# Patient Record
Sex: Female | Born: 1995
Health system: Southern US, Community
[De-identification: ages and names within clinical notes are randomized; demographics above are authoritative.]

## PROBLEM LIST (undated history)

## (undated) ENCOUNTER — Inpatient Hospital Stay (HOSPITAL_COMMUNITY): Payer: Self-pay

## (undated) DIAGNOSIS — J45909 Unspecified asthma, uncomplicated: Secondary | ICD-10-CM

## (undated) DIAGNOSIS — N946 Dysmenorrhea, unspecified: Secondary | ICD-10-CM

## (undated) DIAGNOSIS — F419 Anxiety disorder, unspecified: Secondary | ICD-10-CM

## (undated) DIAGNOSIS — K5190 Ulcerative colitis, unspecified, without complications: Secondary | ICD-10-CM

## (undated) DIAGNOSIS — N809 Endometriosis, unspecified: Secondary | ICD-10-CM

## (undated) DIAGNOSIS — R011 Cardiac murmur, unspecified: Secondary | ICD-10-CM

## (undated) DIAGNOSIS — O139 Gestational [pregnancy-induced] hypertension without significant proteinuria, unspecified trimester: Secondary | ICD-10-CM

## (undated) HISTORY — DX: Gestational (pregnancy-induced) hypertension without significant proteinuria, unspecified trimester: O13.9

## (undated) HISTORY — PX: BIOPSY BOWEL: PRO7

## (undated) HISTORY — DX: Anxiety disorder, unspecified: F41.9

## (undated) HISTORY — DX: Dysmenorrhea, unspecified: N94.6

## (undated) HISTORY — PX: COLON BIOPSY: SHX1369

## (undated) HISTORY — DX: Unspecified asthma, uncomplicated: J45.909

## (undated) HISTORY — PX: NO PAST SURGERIES: SHX2092

---
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
## 2016-01-15 ENCOUNTER — Emergency Department (HOSPITAL_COMMUNITY)
Admission: EM | Admit: 2016-01-15 | Discharge: 2016-01-15 | Disposition: A | Discharge status: Home / Self Care | Payer: Self-pay | Attending: Emergency Medicine | Admitting: Emergency Medicine

## 2016-01-15 DIAGNOSIS — Z8719 Personal history of other diseases of the digestive system: Secondary | ICD-10-CM | POA: Insufficient documentation

## 2016-01-15 DIAGNOSIS — K529 Noninfective gastroenteritis and colitis, unspecified: Secondary | ICD-10-CM | POA: Insufficient documentation

## 2016-01-15 DIAGNOSIS — Z79899 Other long term (current) drug therapy: Secondary | ICD-10-CM | POA: Insufficient documentation

## 2016-01-15 LAB — CBC
HCT: 37.4 % (ref 36.0–46.0)
HEMOGLOBIN: 12.7 g/dL (ref 12.0–15.0)
MCH: 30.2 pg (ref 26.0–34.0)
MCHC: 34 g/dL (ref 30.0–36.0)
MCV: 88.8 fL (ref 78.0–100.0)
Platelets: 405 10*3/uL — ABNORMAL HIGH (ref 150–400)
RBC: 4.21 MIL/uL (ref 3.87–5.11)
RDW: 13.3 % (ref 11.5–15.5)
WBC: 15.8 10*3/uL — ABNORMAL HIGH (ref 4.0–10.5)

## 2016-01-15 LAB — COMPREHENSIVE METABOLIC PANEL
ALK PHOS: 49 U/L (ref 38–126)
ALT: 13 U/L — ABNORMAL LOW (ref 14–54)
ANION GAP: 9 (ref 5–15)
AST: 20 U/L (ref 15–41)
Albumin: 3.9 g/dL (ref 3.5–5.0)
BILIRUBIN TOTAL: 0.9 mg/dL (ref 0.3–1.2)
BUN: 10 mg/dL (ref 6–20)
CALCIUM: 8.6 mg/dL — AB (ref 8.9–10.3)
CO2: 22 mmol/L (ref 22–32)
Chloride: 105 mmol/L (ref 101–111)
Creatinine, Ser: 0.44 mg/dL (ref 0.44–1.00)
Glucose, Bld: 123 mg/dL — ABNORMAL HIGH (ref 65–99)
Potassium: 3.8 mmol/L (ref 3.5–5.1)
Sodium: 136 mmol/L (ref 135–145)
TOTAL PROTEIN: 8.1 g/dL (ref 6.5–8.1)

## 2016-01-15 LAB — I-STAT BETA HCG BLOOD, ED (MC, WL, AP ONLY): I-stat hCG, quantitative: <5 m[IU]/mL (ref <5)

## 2016-01-15 LAB — LIPASE, BLOOD: Lipase: 16 U/L (ref 11–51)

## 2016-01-15 MED ORDER — ONDANSETRON HCL 4 MG/2ML IJ SOLN
4.0000 mg | Freq: Once | INTRAMUSCULAR | Status: AC | PRN
  Administered 2016-01-15: 4 mg via INTRAVENOUS
  Filled 2016-01-15: qty 2

## 2016-01-15 MED ORDER — ONDANSETRON HCL 4 MG/2ML IJ SOLN
4.0000 mg | Freq: Once | INTRAMUSCULAR | Status: AC
  Administered 2016-01-15: 4 mg via INTRAVENOUS
  Filled 2016-01-15: qty 2

## 2016-01-15 MED ORDER — PREDNISONE 10 MG PO TABS: 20.0000 mg | ORAL_TABLET | Freq: Two times a day (BID) | ORAL | 0 refills | Status: DC

## 2016-01-15 MED ORDER — SODIUM CHLORIDE 0.9 % IV BOLUS (SEPSIS)
1000.0000 mL | Freq: Once | INTRAVENOUS | Status: AC
  Administered 2016-01-15: 1000 mL via INTRAVENOUS

## 2016-01-15 MED ORDER — ONDANSETRON 8 MG PO TBDP: ORAL_TABLET | ORAL | 0 refills | Status: DC

## 2016-01-15 MED ORDER — KETOROLAC TROMETHAMINE 30 MG/ML IJ SOLN
30.0000 mg | Freq: Once | INTRAMUSCULAR | Status: AC
  Administered 2016-01-15: 30 mg via INTRAVENOUS
  Filled 2016-01-15: qty 1

## 2016-01-15 NOTE — Discharge Instructions (Signed)
Prednisone as prescribed.  Zofran as prescribed as needed for nausea.  Follow-up with your gastroenterologist in the next 1-2 weeks for a recheck, and return to the ER if you develop worsening pain, high fever, bloody stool, or other new and concerning symptoms.

## 2016-01-15 NOTE — ED Provider Notes (Signed)
[facility] DEPT Provider Note   CSN: [id number] Arrival date & time: 01/15/16  0343     History   Chief Complaint Chief Complaint  Patient presents with  . Abdominal Pain    HPI Kathleen Hamilton is a 20 y.o. female.  Patient is a 20 year old female with history of ulcerative colitis. She presents for evaluation of nausea, vomiting, and diarrhea. This started acutely this evening. She reports watery diarrhea that is nonbloody. She denies any fevers or chills, but did feel flushed earlier. She denies any ill contacts. She denies consuming any suspicious foods.   The history is provided by the patient.  Abdominal Pain   This is a new problem. The current episode started 3 to 5 hours ago. The problem occurs constantly. The problem has been gradually improving. The pain is located in the generalized abdominal region. The quality of the pain is cramping. The pain is moderate. Associated symptoms include nausea and vomiting. Pertinent negatives include fever and flatus. Nothing aggravates the symptoms. Nothing relieves the symptoms.    Past Medical History:  Diagnosis Date  . Dysmenorrhea   . Endometriosis   . Heart murmur   . Ulcerative colitis [facility])     Patient Active Problem List   Diagnosis Date Noted  . VISUAL ACUITY, DECREASED 04/19/2010  . OVERWEIGHT 04/15/2010  . DYSMENORRHEA 04/15/2010  . MENORRHAGIA 04/15/2010    Past Surgical History:  Procedure Laterality Date  . COLON BIOPSY     hx of colon problems.  ? mega colon, awaiting bx results  . NO PAST SURGERIES      OB History    Gravida Para Term Preterm AB Living   1             SAB TAB Ectopic Multiple Live Births                   Home Medications    Prior to Admission medications   Medication Sig Start Date End Date Taking? Authorizing Provider  acetaminophen (TYLENOL) 500 MG tablet Take 1 tablet (500 mg total) by mouth every 6 (six) hours as needed. Patient not taking: Reported on 11/28/2014  10/07/14   Antonietta Breach, PA-C  albuterol (PROVENTIL HFA;VENTOLIN HFA) 108 (90 BASE) MCG/ACT inhaler Inhale 2 puffs into the lungs 4 (four) times daily. Patient taking differently: Inhale 2 puffs into the lungs every 4 (four) hours as needed for wheezing or shortness of breath.  10/29/13   Harden Mo, MD  diphenoxylate-atropine (LOMOTIL) 2.5-0.025 MG tablet Take 1 tablet by mouth 4 (four) times daily as needed for diarrhea or loose stools. 10/03/15   Konrad Felix, PA  HYDROcodone-acetaminophen (NORCO/VICODIN) 5-325 MG tablet Take 2 tablets by mouth every 4 (four) hours as needed. 10/03/15   Konrad Felix, PA  ipratropium (ATROVENT) 0.06 % nasal spray Place 2 sprays into both nostrils 4 (four) times daily. Patient not taking: Reported on 11/28/2014 10/29/13   Harden Mo, MD  Norethindrone Acetate-Ethinyl Estrad-FE (LOESTRIN 24 FE) 1-20 MG-MCG(24) tablet Take 1 tablet by mouth daily. 11/15/14   Historical Provider, MD  omeprazole (PRILOSEC) 20 MG capsule Take 20 mg by mouth daily. 03/20/14   Historical Provider, MD  ondansetron (ZOFRAN ODT) 4 MG disintegrating tablet 34m ODT q4 hours prn nausea/vomit 11/28/14   DJulianne Rice MD  oxyCODONE-acetaminophen (PERCOCET/ROXICET) 5-325 MG per tablet Take 1 tablet by mouth every 6 (six) hours as needed for severe pain. Patient not taking: Reported on 11/28/2014 07/31/14   JAlmyra Free  N Wenzel, PA-C  predniSONE (DELTASONE) 10 MG tablet Sig: 4 tables once a day for 3 days, 3 tablets once a day X3 days, 2 tablets a day for 3 days, 1 tablet a day for 3 days. 10/03/15   Konrad Felix, PA  promethazine (PHENERGAN) 12.5 MG tablet Take 1 tablet (12.5 mg total) by mouth every 6 (six) hours as needed for nausea or vomiting. Patient not taking: Reported on 11/28/2014 07/31/14   Luvenia Redden, PA-C  sulfaSALAzine (AZULFIDINE) 500 MG tablet Take 2 tablets by mouth 2 (two) times daily. 09/28/14   Historical Provider, MD    Family History Family History  Problem Relation  Age of Onset  . Diabetes Maternal Grandmother   . Hypertension Maternal Grandmother   . Heart disease Maternal Grandmother   . Diabetes Paternal Grandmother   . Heart disease Paternal Grandmother   . Hypertension Paternal Grandmother   . Diabetes Paternal Grandfather   . Heart disease Paternal Grandfather   . Hypertension Paternal Grandfather     Social History Social History  Substance Use Topics  . Smoking status: Never Smoker  . Smokeless tobacco: Never Used  . Alcohol use No     Allergies   Aspirin; Fish allergy; Naproxen; and Shellfish allergy   Review of Systems Review of Systems  Constitutional: Negative for fever.  Gastrointestinal: Positive for abdominal pain, nausea and vomiting. Negative for flatus.  All other systems reviewed and are negative.    Physical Exam Updated Vital Signs BP 134/80 (BP Location: Left Arm)   Pulse 111   Temp 98.4 F (36.9 C) (Oral)   Resp 20   Ht 5' 5.5" (1.664 m)   Wt 225 lb (102.1 kg)   SpO2 97%   BMI 36.87 kg/m   Physical Exam  Constitutional: She is oriented to person, place, and time. She appears well-developed and well-nourished. No distress.  HENT:  Head: Normocephalic and atraumatic.  Neck: Normal range of motion. Neck supple.  Cardiovascular: Normal rate and regular rhythm.  Exam reveals no gallop and no friction rub.   No murmur heard. Pulmonary/Chest: Effort normal and breath sounds normal. No respiratory distress. She has no wheezes.  Abdominal: Soft. Bowel sounds are normal. She exhibits no distension. There is tenderness. There is no rebound and no guarding.  There is mild tenderness to palpation in all 4 quadrants with no rebound or guarding.  Musculoskeletal: Normal range of motion.  Neurological: She is alert and oriented to person, place, and time.  Skin: Skin is warm and dry. She is not diaphoretic.  Nursing note and vitals reviewed.    ED Treatments / Results  Labs (all labs ordered are listed,  but only abnormal results are displayed) Labs Reviewed  CBC - Abnormal; Notable for the following:       Result Value   WBC 15.8 (*)    Platelets 405 (*)    All other components within normal limits  LIPASE, BLOOD  COMPREHENSIVE METABOLIC PANEL  URINALYSIS, ROUTINE W REFLEX MICROSCOPIC (NOT AT [facility])  I-STAT BETA HCG BLOOD, ED ([facility], [facility], [facility] ONLY)    EKG  EKG Interpretation None       Radiology No results found.  Procedures Procedures (including critical care time)  Medications Ordered in ED Medications  sodium chloride 0.9 % bolus 1,000 mL (not administered)  ketorolac (TORADOL) 30 MG/ML injection 30 mg (not administered)  ondansetron (ZOFRAN) injection 4 mg (not administered)  ondansetron (ZOFRAN) injection 4 mg (4 mg Intravenous Given  01/15/16 0440)     Initial Impression / Assessment and Plan / ED Course  I have reviewed the triage vital signs and the nursing notes.  Pertinent labs & imaging results that were available during my care of the patient were reviewed by me and considered in my medical decision making (see chart for details).  Clinical Course     Symptoms either viral or possibly related to a flare of ulcerative colitis. She will be treated with prednisone and Zofran. To return as needed for any problems.  Her abdomen was reexamined prior to discharge. She has no focal tenderness, most notably no tenderness in the right lower quadrant. She does have an elevated Chevalier count, however I believe this is attributed to either an infectious diarrhea or flare of her UC. I highly doubt appendicitis, cholecystitis, or other acute intra-abdominal process.  Final Clinical Impressions(s) / ED Diagnoses   Final diagnoses:  None    New Prescriptions New Prescriptions   No medications on file     Veryl Speak, MD 01/15/16 [phone]

## 2016-01-15 NOTE — ED Notes (Addendum)
Pt has been having abdominal pain, watery diarrhea, nausea, vomiting since 2000 yesterday. Pt unable to sleep due to pain. Pt unable to hold down any fluids. Hx of ulcerative colitis. Pt reports no blood in stool.

## 2016-07-04 ENCOUNTER — Ambulatory Visit (HOSPITAL_COMMUNITY)
Admission: EM | Admit: 2016-07-04 | Discharge: 2016-07-04 | Disposition: A | Discharge status: Home / Self Care | Payer: BLUE CROSS/BLUE SHIELD | Attending: Family Medicine | Admitting: Family Medicine

## 2016-07-04 ENCOUNTER — Encounter (HOSPITAL_COMMUNITY): Payer: Self-pay | Admitting: Emergency Medicine

## 2016-07-04 DIAGNOSIS — B349 Viral infection, unspecified: Secondary | ICD-10-CM

## 2016-07-04 MED ORDER — BUTALBITAL-APAP-CAFFEINE 50-325-40 MG PO TABS: 1.0000 | ORAL_TABLET | Freq: Four times a day (QID) | ORAL | 0 refills | Status: AC | PRN

## 2016-07-04 NOTE — ED Provider Notes (Signed)
[facility]    CSN: [id number] Arrival date & time: 07/04/16  1811     History   Chief Complaint Chief Complaint  Patient presents with  . Headache    HPI Kathleen Hamilton is a 21 y.o. female.   Headache since yesterday.  Has had dizziness and nausea.  She's had some viral malaise over the last couple days followed by the headache that began last night. The headache is described as being like a band around her head.  Patient has no neurological signs or fever.  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      Past Medical History:  Diagnosis Date  . Dysmenorrhea   . Endometriosis   . Heart murmur   . Ulcerative colitis [facility])     Patient Active Problem List   Diagnosis Date Noted  . VISUAL ACUITY, DECREASED 04/19/2010  . OVERWEIGHT 04/15/2010  . DYSMENORRHEA 04/15/2010  . MENORRHAGIA 04/15/2010    Past Surgical History:  Procedure Laterality Date  . COLON BIOPSY     hx of colon problems.  ? mega colon, awaiting bx results  . NO PAST SURGERIES      OB History    Gravida Para Term Preterm AB Living   1             SAB TAB Ectopic Multiple Live Births                   Home Medications    Prior to Admission medications   Medication Sig Start Date End Date Taking? Authorizing Provider  acetaminophen (TYLENOL) 500 MG tablet Take 1 tablet (500 mg total) by mouth every 6 (six) hours as needed. Patient not taking: Reported on 11/28/2014 10/07/14   Antonietta Breach, PA-C  albuterol (PROVENTIL HFA;VENTOLIN HFA) 108 (90 BASE) MCG/ACT inhaler Inhale 2 puffs into the lungs 4 (four) times daily. Patient taking differently: Inhale 2 puffs into the lungs every 4 (four) hours as needed for wheezing or shortness of breath.  10/29/13   Harden Mo, MD  butalbital-acetaminophen-caffeine (FIORICET, ESGIC) [phone] MG tablet Take 1-2 tablets by mouth every 6 (six) hours as needed for headache. 07/04/16 07/04/17  Robyn Haber, MD    diphenoxylate-atropine (LOMOTIL) 2.5-0.025 MG tablet Take 1 tablet by mouth 4 (four) times daily as needed for diarrhea or loose stools. Patient not taking: Reported on 01/15/2016 10/03/15   Konrad Felix, PA  HYDROcodone-acetaminophen (NORCO/VICODIN) 5-325 MG tablet Take 2 tablets by mouth every 4 (four) hours as needed. Patient not taking: Reported on 01/15/2016 10/03/15   Linde Gillis C, PA  ipratropium (ATROVENT) 0.06 % nasal spray Place 2 sprays into both nostrils 4 (four) times daily. Patient not taking: Reported on 11/28/2014 10/29/13   Harden Mo, MD  ondansetron (ZOFRAN ODT) 8 MG disintegrating tablet 38m ODT q4 hours prn nausea 01/15/16   DVeryl Speak MD  oxyCODONE-acetaminophen (PERCOCET/ROXICET) 5-325 MG per tablet Take 1 tablet by mouth every 6 (six) hours as needed for severe pain. Patient not taking: Reported on 11/28/2014 07/31/14   WLuvenia Redden PA-C  predniSONE (DELTASONE) 10 MG tablet Take 2 tablets (20 mg total) by mouth 2 (two) times daily. 01/15/16   DVeryl Speak MD  promethazine (PHENERGAN) 12.5 MG tablet Take 1 tablet (12.5 mg total) by mouth every 6 (six) hours as needed for nausea or vomiting. Patient not taking: Reported on 11/28/2014 07/31/14   WLuvenia Redden PA-C    Family  History Family History  Problem Relation Age of Onset  . Diabetes Maternal Grandmother   . Hypertension Maternal Grandmother   . Heart disease Maternal Grandmother   . Diabetes Paternal Grandmother   . Heart disease Paternal Grandmother   . Hypertension Paternal Grandmother   . Diabetes Paternal Grandfather   . Heart disease Paternal Grandfather   . Hypertension Paternal Grandfather     Social History Social History  Substance Use Topics  . Smoking status: Never Smoker  . Smokeless tobacco: Never Used  . Alcohol use No     Allergies   Aspirin; Fish allergy; Naproxen; and Shellfish allergy   Review of Systems Review of Systems  Constitutional: Negative.   HENT:  Negative.   Respiratory: Negative.   Musculoskeletal: Positive for myalgias.  Neurological: Positive for headaches. Negative for tremors, seizures, syncope, facial asymmetry, speech difficulty, weakness and numbness.     Physical Exam Triage Vital Signs ED Triage Vitals [07/04/16 1852]  Enc Vitals Group     BP 121/75     Pulse Rate 71     Resp 16     Temp 98.7 F (37.1 C)     Temp Source Oral     SpO2 100 %     Weight      Height      Head Circumference      Peak Flow      Pain Score 8     Pain Loc      Pain Edu?      Excl. in [place]?    No data found.   Updated Vital Signs BP 121/75 (BP Location: Right Arm)   Pulse 71   Temp 98.7 F (37.1 C) (Oral)   Resp 16   LMP 06/27/2016   SpO2 100%    Physical Exam  Constitutional: She is oriented to person, place, and time. She appears well-developed and well-nourished.  HENT:  Right Ear: External ear normal.  Left Ear: External ear normal.  Mouth/Throat: Oropharynx is clear and moist.  Eyes: Conjunctivae and EOM are normal. Pupils are equal, round, and reactive to light.  Normal fundi  Neck: Normal range of motion. Neck supple.  Pulmonary/Chest: Effort normal.  Musculoskeletal: Normal range of motion.  Neurological: She is alert and oriented to person, place, and time. No cranial nerve deficit. She exhibits normal muscle tone. Coordination normal.  Skin: Skin is warm and dry.  Nursing note and vitals reviewed.    UC Treatments / Results  Labs (all labs ordered are listed, but only abnormal results are displayed) Labs Reviewed - No data to display  EKG  EKG Interpretation None       Radiology No results found.  Procedures Procedures (including critical care time)  Medications Ordered in UC Medications - No data to display   Initial Impression / Assessment and Plan / UC Course  I have reviewed the triage vital signs and the nursing notes.  Pertinent labs & imaging results that were available during  my care of the patient were reviewed by me and considered in my medical decision making (see chart for details).     Final Clinical Impressions(s) / UC Diagnoses   Final diagnoses:  Acute viral syndrome    New Prescriptions New Prescriptions   BUTALBITAL-ACETAMINOPHEN-CAFFEINE (FIORICET, ESGIC) 50-325-40 MG TABLET    Take 1-2 tablets by mouth every 6 (six) hours as needed for headache.     Robyn Haber, MD 07/04/16 1910

## 2016-07-04 NOTE — ED Triage Notes (Signed)
Headache since yesterday.  Has had dizziness and nausea.

## 2016-07-04 NOTE — Discharge Instructions (Signed)
If symptoms worsen, go to the emergency department

## 2016-11-02 ENCOUNTER — Emergency Department (HOSPITAL_COMMUNITY): Payer: BLUE CROSS/BLUE SHIELD

## 2016-11-02 ENCOUNTER — Encounter (HOSPITAL_COMMUNITY): Payer: Self-pay | Admitting: *Deleted

## 2016-11-02 ENCOUNTER — Emergency Department (HOSPITAL_COMMUNITY)
Admission: EM | Admit: 2016-11-02 | Discharge: 2016-11-02 | Disposition: A | Discharge status: Home / Self Care | Payer: BLUE CROSS/BLUE SHIELD | Attending: Emergency Medicine | Admitting: Emergency Medicine

## 2016-11-02 DIAGNOSIS — Z79899 Other long term (current) drug therapy: Secondary | ICD-10-CM | POA: Diagnosis not present

## 2016-11-02 DIAGNOSIS — Z8719 Personal history of other diseases of the digestive system: Secondary | ICD-10-CM | POA: Diagnosis not present

## 2016-11-02 DIAGNOSIS — R197 Diarrhea, unspecified: Secondary | ICD-10-CM | POA: Insufficient documentation

## 2016-11-02 DIAGNOSIS — R1084 Generalized abdominal pain: Secondary | ICD-10-CM | POA: Insufficient documentation

## 2016-11-02 DIAGNOSIS — N9489 Other specified conditions associated with female genital organs and menstrual cycle: Secondary | ICD-10-CM | POA: Diagnosis not present

## 2016-11-02 LAB — URINALYSIS, ROUTINE W REFLEX MICROSCOPIC
BILIRUBIN URINE: NEGATIVE
GLUCOSE, UA: NEGATIVE mg/dL
HGB URINE DIPSTICK: NEGATIVE
KETONES UR: 5 mg/dL — AB
LEUKOCYTES UA: NEGATIVE
NITRITE: POSITIVE — AB
PH: 7 (ref 5.0–8.0)
Protein, ur: NEGATIVE mg/dL
RBC / HPF: NONE SEEN RBC/hpf (ref 0–5)
SPECIFIC GRAVITY, URINE: 1.013 (ref 1.005–1.030)

## 2016-11-02 LAB — CBC
HCT: 39.9 % (ref 36.0–46.0)
HEMOGLOBIN: 13.6 g/dL (ref 12.0–15.0)
MCH: 30 pg (ref 26.0–34.0)
MCHC: 34.1 g/dL (ref 30.0–36.0)
MCV: 88.1 fL (ref 78.0–100.0)
Platelets: 436 10*3/uL — ABNORMAL HIGH (ref 150–400)
RBC: 4.53 MIL/uL (ref 3.87–5.11)
RDW: 13.1 % (ref 11.5–15.5)
WBC: 13.6 10*3/uL — ABNORMAL HIGH (ref 4.0–10.5)

## 2016-11-02 LAB — COMPREHENSIVE METABOLIC PANEL
ALT: 12 U/L — ABNORMAL LOW (ref 14–54)
ANION GAP: 7 (ref 5–15)
AST: 18 U/L (ref 15–41)
Albumin: 3.8 g/dL (ref 3.5–5.0)
Alkaline Phosphatase: 50 U/L (ref 38–126)
BILIRUBIN TOTAL: 0.8 mg/dL (ref 0.3–1.2)
BUN: 8 mg/dL (ref 6–20)
CO2: 22 mmol/L (ref 22–32)
Calcium: 9.1 mg/dL (ref 8.9–10.3)
Chloride: 107 mmol/L (ref 101–111)
Creatinine, Ser: 0.57 mg/dL (ref 0.44–1.00)
Glucose, Bld: 105 mg/dL — ABNORMAL HIGH (ref 65–99)
Potassium: 3.9 mmol/L (ref 3.5–5.1)
Sodium: 136 mmol/L (ref 135–145)
TOTAL PROTEIN: 8.1 g/dL (ref 6.5–8.1)

## 2016-11-02 LAB — I-STAT BETA HCG BLOOD, ED (MC, WL, AP ONLY): I-stat hCG, quantitative: <5 m[IU]/mL (ref <5)

## 2016-11-02 LAB — LIPASE, BLOOD: Lipase: 23 U/L (ref 11–51)

## 2016-11-02 MED ORDER — HYDROCODONE-ACETAMINOPHEN 5-325 MG PO TABS
1.0000 | ORAL_TABLET | Freq: Once | ORAL | Status: AC
  Administered 2016-11-02: 1 via ORAL
  Filled 2016-11-02: qty 1

## 2016-11-02 MED ORDER — DICYCLOMINE HCL 10 MG PO CAPS
20.0000 mg | ORAL_CAPSULE | Freq: Once | ORAL | Status: AC
  Administered 2016-11-02: 20 mg via ORAL
  Filled 2016-11-02: qty 2

## 2016-11-02 MED ORDER — ONDANSETRON 4 MG PO TBDP
ORAL_TABLET | ORAL | Status: AC
  Filled 2016-11-02: qty 1

## 2016-11-02 MED ORDER — IOPAMIDOL (ISOVUE-300) INJECTION 61%
INTRAVENOUS | Status: AC
  Administered 2016-11-02: 100 mL
  Filled 2016-11-02: qty 100

## 2016-11-02 MED ORDER — SODIUM CHLORIDE 0.9 % IV BOLUS (SEPSIS)
1000.0000 mL | Freq: Once | INTRAVENOUS | Status: AC
  Administered 2016-11-02: 1000 mL via INTRAVENOUS

## 2016-11-02 MED ORDER — IOPAMIDOL (ISOVUE-300) INJECTION 61%
INTRAVENOUS | Status: AC
  Filled 2016-11-02: qty 30

## 2016-11-02 MED ORDER — ONDANSETRON 4 MG PO TBDP
4.0000 mg | ORAL_TABLET | Freq: Once | ORAL | Status: AC | PRN
  Administered 2016-11-02: 4 mg via ORAL

## 2016-11-02 NOTE — ED Triage Notes (Addendum)
Pt reports chronic loose stools with >10 in the last 24 with red blood in stool intermittently that his her baseline with flares per pt, pt has ulcerative colitis, Outlaw, MD is pts GI MD, pt denies vomiting, A&Ox4

## 2016-11-02 NOTE — Discharge Instructions (Signed)
Clear liquid diet until follow up with GI.

## 2016-11-02 NOTE — ED Provider Notes (Signed)
[facility] DEPT Provider Note   CSN: [id number] Arrival date & time: 11/02/16  0700     History   Chief Complaint Chief Complaint  Patient presents with  . Abdominal Pain   HPI  Kathleen Hamilton is a [place] female with PMH significant for UC who presents with diarrhea and abdominal pain. She was diagnosed with UC ~2 years ago by colonoscopy. She was on mesalamine for a short period of time but she stopped taking this due to getting pregnant and losing insurance. She saw Dr. Paulita Fujita last week who was planning on starting her on a maintenance medication after drawing some labwork. She normally has ~2-3 BMs/day, occasionally bloody. She woke up last night with very severe abdominal pain. Has nausea, no emesis, and has had multiple bouts of diarrhea. Nonbloody today, but bloody last week. Does have some shortness of breath 2/2 pain, as well as lightheadedness. She thinks she is having a flare. Denies skin changes, eye changes, or joint changes. Denies fevers, chills, chest pain, dysuria, or leg swelling. No recent travel.  She also is having increased heartburn symptoms for the last month, worse after particular foods - not taking any medication for this either.  She has not been taking any medications for at least a year now. Denies smoking, alcohol use, or other illicit drugs. She works as a Surveyor, minerals. Has been trying to eat more fruits recently. Both her brother and mother have had colectomies due to "no movement".  Past Medical History:  Diagnosis Date  . Dysmenorrhea   . Endometriosis   . Heart murmur   . Ulcerative colitis [facility])     Patient Active Problem List   Diagnosis Date Noted  . VISUAL ACUITY, DECREASED 04/19/2010  . OVERWEIGHT 04/15/2010  . DYSMENORRHEA 04/15/2010  . MENORRHAGIA 04/15/2010    Past Surgical History:  Procedure Laterality Date  . COLON BIOPSY     hx of colon problems.  ? mega colon, awaiting bx results  . NO PAST SURGERIES      OB History    Gravida Para  Term Preterm AB Living   1             SAB TAB Ectopic Multiple Live Births                   Home Medications    Prior to Admission medications   Medication Sig Start Date End Date Taking? Authorizing Provider  acetaminophen (TYLENOL) 500 MG tablet Take 1 tablet (500 mg total) by mouth every 6 (six) hours as needed. Patient not taking: Reported on 11/28/2014 10/07/14   Antonietta Breach, PA-C  albuterol (PROVENTIL HFA;VENTOLIN HFA) 108 (90 BASE) MCG/ACT inhaler Inhale 2 puffs into the lungs 4 (four) times daily. Patient taking differently: Inhale 2 puffs into the lungs every 4 (four) hours as needed for wheezing or shortness of breath.  10/29/13   Harden Mo, MD  butalbital-acetaminophen-caffeine (FIORICET, ESGIC) [phone] MG tablet Take 1-2 tablets by mouth every 6 (six) hours as needed for headache. 07/04/16 07/04/17  Robyn Haber, MD  diphenoxylate-atropine (LOMOTIL) 2.5-0.025 MG tablet Take 1 tablet by mouth 4 (four) times daily as needed for diarrhea or loose stools. Patient not taking: Reported on 01/15/2016 10/03/15   Konrad Felix, PA  HYDROcodone-acetaminophen (NORCO/VICODIN) 5-325 MG tablet Take 2 tablets by mouth every 4 (four) hours as needed. Patient not taking: Reported on 01/15/2016 10/03/15   Linde Gillis C, PA  ipratropium (ATROVENT) 0.06 % nasal spray Place 2 sprays  into both nostrils 4 (four) times daily. Patient not taking: Reported on 11/28/2014 10/29/13   Harden Mo, MD  ondansetron (ZOFRAN ODT) 8 MG disintegrating tablet 64m ODT q4 hours prn nausea 01/15/16   DVeryl Speak MD  oxyCODONE-acetaminophen (PERCOCET/ROXICET) 5-325 MG per tablet Take 1 tablet by mouth every 6 (six) hours as needed for severe pain. Patient not taking: Reported on 11/28/2014 07/31/14   WLuvenia Redden PA-C  predniSONE (DELTASONE) 10 MG tablet Take 2 tablets (20 mg total) by mouth 2 (two) times daily. 01/15/16   DVeryl Speak MD  promethazine (PHENERGAN) 12.5 MG tablet Take 1 tablet  (12.5 mg total) by mouth every 6 (six) hours as needed for nausea or vomiting. Patient not taking: Reported on 11/28/2014 07/31/14   WLuvenia Redden PA-C    Family History Family History  Problem Relation Age of Onset  . Diabetes Maternal Grandmother   . Hypertension Maternal Grandmother   . Heart disease Maternal Grandmother   . Diabetes Paternal Grandmother   . Heart disease Paternal Grandmother   . Hypertension Paternal Grandmother   . Diabetes Paternal Grandfather   . Heart disease Paternal Grandfather   . Hypertension Paternal Grandfather     Social History Social History  Substance Use Topics  . Smoking status: Never Smoker  . Smokeless tobacco: Never Used  . Alcohol use No     Allergies   Aspirin; Fish allergy; Naproxen; and Shellfish allergy   Review of Systems Review of Systems  Unremarkable except as stated above in HPI.  Physical Exam Updated Vital Signs BP 107/71 (BP Location: Left Arm)   Pulse 93   Temp 98.7 F (37.1 C) (Oral)   Resp 18   Ht 5' 5.5" (1.664 m)   Wt 108.9 kg (240 lb)   LMP 10/19/2016 (Approximate)   SpO2 97%   BMI 39.33 kg/m   Physical Exam  GEN: Young female lying in bed in NAD; alert and oriented HENT: Moist mucous membranes. No visible lesions. EYES: PERRL. Sclera anicteric. RESP: Clear to auscultation bilaterally. No wheezes, rales, or rhonchi. No increased work of breathing. On RA. CV: Normal rate and regular rhythm. No murmurs, gallops, or rubs. No LE edema. ABD: Soft. Tender to palpation on left abdomen. No rebound or guarding. Non-distended. Diminished bowel sounds. EXT: No edema. Warm. 2+ DP pulses. NEURO: Cranial nerves II-XII grossly intact. Able to lift all four extremities against gravity. Speech fluent and appropriate.  ED Treatments / Results  Labs (all labs ordered are listed, but only abnormal results are displayed) Labs Reviewed  COMPREHENSIVE METABOLIC PANEL - Abnormal; Notable for the following:        Result Value   Glucose, Bld 105 (*)    ALT 12 (*)    All other components within normal limits  CBC - Abnormal; Notable for the following:    WBC 13.6 (*)    Platelets 436 (*)    All other components within normal limits  URINALYSIS, ROUTINE W REFLEX MICROSCOPIC - Abnormal; Notable for the following:    Ketones, ur 5 (*)    Nitrite POSITIVE (*)    Bacteria, UA FEW (*)    Squamous Epithelial / LPF 0-5 (*)    All other components within normal limits  LIPASE, BLOOD    EKG  EKG Interpretation None       Radiology No results found.  Procedures Procedures (including critical care time)  Medications Ordered in ED Medications  ondansetron (ZOFRAN-ODT) 4 MG disintegrating tablet (not  administered)  ondansetron (ZOFRAN-ODT) disintegrating tablet 4 mg (4 mg Oral Given 11/02/16 0744)     Initial Impression / Assessment and Plan / ED Course  I have reviewed the triage vital signs and the nursing notes.  Pertinent labs & imaging results that were available during my care of the patient were reviewed by me and considered in my medical decision making (see chart for details).  Kathleen Hamilton is a 21yo female with PMH of UC (not currently on any medications) who presents with severe left-sided abdominal pain, nausea, and multiple bouts of nonbloody diarrhea since last night. UC flare vs gastroenteritis. Elevated WBC to 13.6. Lipase, CMP, and UA wnl. Kathleen get pregnancy test, give dicyclomine and 1L NS, and call GI.  2:00pm Spoke to GI on phone, and GI recommended CT scan w/ contrast, stool studies, cdiff. If patient Kathleen be discharged, Kathleen give prednisone taper and follow up with Dr. Paulita Fujita outpatient. Otherwise, can re-consult GI if she Kathleen be admitted.  3:50pm Updated patient on plan. She Kathleen get CT scan with oral contrast, stool studies and cdiff. Patient still complaining of abdominal pain after dicyclomine. Avoiding NSAIDs 2/2 bleeding risk. Kathleen try Norco x1. If CT okay, Kathleen  re-consult GI for outpatient medication regimen with close follow-up with her outpatient GI doctor for management of UC.  Patient seen and discussed with Dr. Ralene Bathe. Patient signed out to Kathleen Hamilton.  Final Clinical Impressions(s) / ED Diagnoses   Final diagnoses:  None    New Prescriptions New Prescriptions   No medications on file     Colbert Ewing, MD 11/02/16 1646    Quintella Reichert, MD 11/03/16 [phone]

## 2016-11-02 NOTE — ED Provider Notes (Signed)
Patient care assumed from Colbert Ewing, MD at shift change. Please see her note for further.  Briefly, the patient presented to the ED complaining of abdominal pain and diarrhea. She has a history of UC.  Plan at shift change was for CT AP and recheck.  CT AP showed no sign of bowel wall inflammation or thickening. No sign of UC. She has some free fluid in her pelvis which may be related to recent resolution of bowel wall inflammation.  On my evaluation the patient is resting comfortably in bed. She is tolerating PO without vomiting. She reports her abdominal pain has resolved. No abdominal TTP.  Original plan was to start steroids. Patient reports her GI doctor did not want her on steroids. As she has no findings on her CT scan will speak with GI again. I spoke with Dr. Oletta Lamas from gastroenterology who reports that she has no findings on her CT scan he would recommend of clear liquid diet and have her follow-up later this week with GI. Patient agrees with this plan. She is feeling better and tolerating by mouth. We'll discharge with close follow-up by GI. Return precautions discussed. I advised the patient to follow-up with their primary care provider this week. I advised the patient to return to the emergency department with new or worsening symptoms or new concerns. The patient verbalized understanding and agreement with plan.    Ct Abdomen Pelvis W Contrast  Result Date: 11/02/2016 CLINICAL DATA:  Lower abdominal pain with some bleeding. History of ulcerative colitis. EXAM: CT ABDOMEN AND PELVIS WITH CONTRAST TECHNIQUE: Multidetector CT imaging of the abdomen and pelvis was performed using the standard protocol following bolus administration of intravenous contrast. CONTRAST:  138m ISOVUE-300 IOPAMIDOL (ISOVUE-300) INJECTION 61% COMPARISON:  None. FINDINGS: Lower chest: No acute abnormality. Hepatobiliary: No focal liver abnormality is seen. No gallstones, gallbladder wall thickening, or biliary  dilatation. Pancreas: Unremarkable. No pancreatic ductal dilatation or surrounding inflammatory changes. Spleen: Normal in size without focal abnormality. Adrenals/Urinary Tract: Adrenal glands appear normal. Kidneys appear normal without mass, stone or hydronephrosis. No perinephric fluid. No ureteral or bladder calculi identified. Stomach/Bowel: Bowel is normal in caliber. No bowel wall thickening or evidence of bowel wall inflammation seen. No convincing diverticulosis or evidence of acute diverticulitis. Vascular/Lymphatic: Clustered small lymph nodes within the central mesentery and right lower quadrant. No enlarged lymph nodes seen in the abdomen or pelvis. No significant vascular findings. Reproductive: Uterus and bilateral adnexa are unremarkable. Other: Trace free fluid in the lower paracolic gutters bilaterally. No additional free fluid. No abscess collections seen. No free intraperitoneal air. Musculoskeletal: No acute or significant osseous findings. IMPRESSION: 1. No bowel obstruction or evidence of bowel wall inflammation identified. 2. Trace free fluid in the lower paracolic gutters bilaterally. This trace fluid may merely be physiologic given the proximity to the adnexal regions. Alternatively, this may be sequela of a recently resolved colonic wall thickening/inflammation. Favor physiologic. No additional free fluid. No abscess collection. No free intraperitoneal air. 3. Clustered small lymph nodes within the central mesentery and right lower quadrant, raising the possibility of a mild mesenteric adenitis. No enlarged lymph nodes seen. Electronically Signed   By: SFranki CabotM.D.   On: 11/02/2016 17:51     Generalized abdominal pain  Diarrhea, unspecified type       DWaynetta Pean PA-C 11/02/16 2027    RQuintella Reichert MD 11/03/16 [phone]

## 2017-04-11 DIAGNOSIS — T7840XA Allergy, unspecified, initial encounter: Secondary | ICD-10-CM | POA: Diagnosis not present

## 2017-04-11 DIAGNOSIS — N946 Dysmenorrhea, unspecified: Secondary | ICD-10-CM | POA: Diagnosis not present

## 2017-06-30 DIAGNOSIS — N6310 Unspecified lump in the right breast, unspecified quadrant: Secondary | ICD-10-CM | POA: Diagnosis not present

## 2017-07-05 ENCOUNTER — Other Ambulatory Visit: Payer: Self-pay | Admitting: Physician Assistant

## 2017-07-05 DIAGNOSIS — N6310 Unspecified lump in the right breast, unspecified quadrant: Secondary | ICD-10-CM

## 2017-07-17 ENCOUNTER — Ambulatory Visit
Admission: RE | Admit: 2017-07-17 | Discharge: 2017-07-17 | Disposition: A | Discharge status: Home / Self Care | Payer: BLUE CROSS/BLUE SHIELD | Source: Ambulatory Visit | Attending: Physician Assistant | Admitting: Physician Assistant

## 2017-07-17 DIAGNOSIS — N6310 Unspecified lump in the right breast, unspecified quadrant: Secondary | ICD-10-CM | POA: Diagnosis not present

## 2017-09-11 DIAGNOSIS — S90562A Insect bite (nonvenomous), left ankle, initial encounter: Secondary | ICD-10-CM | POA: Diagnosis not present

## 2017-09-11 DIAGNOSIS — R21 Rash and other nonspecific skin eruption: Secondary | ICD-10-CM | POA: Diagnosis not present

## 2017-09-25 DIAGNOSIS — R51 Headache: Secondary | ICD-10-CM | POA: Diagnosis not present

## 2017-09-25 DIAGNOSIS — F411 Generalized anxiety disorder: Secondary | ICD-10-CM | POA: Diagnosis not present

## 2017-11-10 ENCOUNTER — Inpatient Hospital Stay (HOSPITAL_COMMUNITY)
Admission: AD | Admit: 2017-11-10 | Discharge: 2017-11-10 | Disposition: A | Discharge status: Home / Self Care | Payer: BLUE CROSS/BLUE SHIELD | Source: Ambulatory Visit | Attending: Obstetrics & Gynecology | Admitting: Obstetrics & Gynecology

## 2017-11-10 ENCOUNTER — Inpatient Hospital Stay (HOSPITAL_COMMUNITY): Payer: BLUE CROSS/BLUE SHIELD

## 2017-11-10 ENCOUNTER — Encounter (HOSPITAL_COMMUNITY): Payer: Self-pay | Admitting: *Deleted

## 2017-11-10 DIAGNOSIS — M549 Dorsalgia, unspecified: Secondary | ICD-10-CM | POA: Insufficient documentation

## 2017-11-10 DIAGNOSIS — O283 Abnormal ultrasonic finding on antenatal screening of mother: Secondary | ICD-10-CM | POA: Diagnosis not present

## 2017-11-10 DIAGNOSIS — R102 Pelvic and perineal pain: Secondary | ICD-10-CM | POA: Diagnosis not present

## 2017-11-10 DIAGNOSIS — O26891 Other specified pregnancy related conditions, first trimester: Secondary | ICD-10-CM | POA: Diagnosis not present

## 2017-11-10 DIAGNOSIS — Z3A00 Weeks of gestation of pregnancy not specified: Secondary | ICD-10-CM | POA: Diagnosis not present

## 2017-11-10 DIAGNOSIS — Z3A01 Less than 8 weeks gestation of pregnancy: Secondary | ICD-10-CM | POA: Insufficient documentation

## 2017-11-10 DIAGNOSIS — O3680X0 Pregnancy with inconclusive fetal viability, not applicable or unspecified: Secondary | ICD-10-CM

## 2017-11-10 DIAGNOSIS — R1030 Lower abdominal pain, unspecified: Secondary | ICD-10-CM | POA: Diagnosis not present

## 2017-11-10 DIAGNOSIS — Z79899 Other long term (current) drug therapy: Secondary | ICD-10-CM | POA: Diagnosis not present

## 2017-11-10 DIAGNOSIS — R109 Unspecified abdominal pain: Secondary | ICD-10-CM | POA: Diagnosis not present

## 2017-11-10 LAB — WET PREP, GENITAL
Clue Cells Wet Prep HPF POC: NONE SEEN
SPERM: NONE SEEN
Trich, Wet Prep: NONE SEEN
YEAST WET PREP: NONE SEEN

## 2017-11-10 LAB — POCT PREGNANCY, URINE: Preg Test, Ur: POSITIVE — AB

## 2017-11-10 LAB — CBC
HCT: 42.3 % (ref 36.0–46.0)
Hemoglobin: 14.6 g/dL (ref 12.0–15.0)
MCH: 31 pg (ref 26.0–34.0)
MCHC: 34.5 g/dL (ref 30.0–36.0)
MCV: 89.8 fL (ref 78.0–100.0)
Platelets: 321 10*3/uL (ref 150–400)
RBC: 4.71 MIL/uL (ref 3.87–5.11)
RDW: 13.9 % (ref 11.5–15.5)
WBC: 12.8 10*3/uL — ABNORMAL HIGH (ref 4.0–10.5)

## 2017-11-10 LAB — URINALYSIS, ROUTINE W REFLEX MICROSCOPIC
Bilirubin Urine: NEGATIVE
GLUCOSE, UA: NEGATIVE mg/dL
HGB URINE DIPSTICK: NEGATIVE
Ketones, ur: 20 mg/dL — AB
Leukocytes, UA: NEGATIVE
Nitrite: NEGATIVE
PH: 6 (ref 5.0–8.0)
PROTEIN: NEGATIVE mg/dL
Specific Gravity, Urine: 1.015 (ref 1.005–1.030)

## 2017-11-10 LAB — HCG, QUANTITATIVE, PREGNANCY: HCG, BETA CHAIN, QUANT, S: 556 m[IU]/mL — AB (ref <5)

## 2017-11-10 NOTE — MAU Note (Signed)
Pt reports her period is 3 days late, lower abd cramping for the last 3 days, back pain. Denies bleeding.

## 2017-11-10 NOTE — MAU Provider Note (Addendum)
History     CSN: [id number]  Arrival date and time: 11/10/17 1755   First Provider Initiated Contact with Patient 11/10/17 1915      Chief Complaint  Patient presents with  . Pelvic Pain  . Back Pain   Kathleen Hamilton is a 22 y.o. G2P0 at 18w1dwho presents today with cramps and back pain.   Pelvic Pain  The patient's primary symptoms include pelvic pain. The patient's pertinent negatives include no vaginal discharge. This is a new problem. Episode onset: 11/09/17. The problem occurs intermittently. The problem has been unchanged. Pain severity now: 3/10  The problem affects both sides. She is pregnant. Associated symptoms include back pain and nausea. Pertinent negatives include no chills, dysuria, fever, frequency or vomiting. The vaginal discharge was normal. There has been no bleeding. Nothing aggravates the symptoms. She has tried nothing for the symptoms. She is sexually active. Her menstrual history has been regular (LMP 10/12/17 ).    OB History    Gravida  2   Para      Term      Preterm      AB      Living  0     SAB      TAB      Ectopic      Multiple      Live Births              Past Medical History:  Diagnosis Date  . Dysmenorrhea   . Endometriosis   . Heart murmur   . Ulcerative colitis ([facility]     Past Surgical History:  Procedure Laterality Date  . BIOPSY BOWEL    . COLON BIOPSY     hx of colon problems.  ? mega colon, awaiting bx results  . NO PAST SURGERIES      Family History  Problem Relation Age of Onset  . Diabetes Maternal Grandmother   . Hypertension Maternal Grandmother   . Heart disease Maternal Grandmother   . Breast cancer Maternal Grandmother        unsure age of onset  . Diabetes Paternal Grandmother   . Heart disease Paternal Grandmother   . Hypertension Paternal Grandmother   . Diabetes Paternal Grandfather   . Heart disease Paternal Grandfather   . Hypertension Paternal Grandfather     Social History    Tobacco Use  . Smoking status: Never Smoker  . Smokeless tobacco: Never Used  Substance Use Topics  . Alcohol use: No  . Drug use: No    Allergies:  Allergies  Allergen Reactions  . Aspirin Other (See Comments)    Reaction:  Stomach bleeding   . Fish Allergy   . Naproxen Other (See Comments)    Reaction:  Stomach bleeding   . Nsaids     GI BLEED  . Shellfish Allergy Diarrhea, Nausea And Vomiting, Swelling and Rash    Medications Prior to Admission  Medication Sig Dispense Refill Last Dose  . acetaminophen (TYLENOL) 500 MG tablet Take 1 tablet (500 mg total) by mouth every 6 (six) hours as needed. (Patient not taking: Reported on 11/28/2014) 30 tablet 0   . albuterol (PROVENTIL HFA;VENTOLIN HFA) 108 (90 BASE) MCG/ACT inhaler Inhale 2 puffs into the lungs 4 (four) times daily. (Patient taking differently: Inhale 2 puffs into the lungs every 4 (four) hours as needed for wheezing or shortness of breath. ) 1 Inhaler 0 Past Month at Unknown time  . diphenoxylate-atropine (LOMOTIL) 2.5-0.025 MG tablet Take  1 tablet by mouth 4 (four) times daily as needed for diarrhea or loose stools. (Patient not taking: Reported on 01/15/2016) 30 tablet 0 Not Taking at Unknown time  . HYDROcodone-acetaminophen (NORCO/VICODIN) 5-325 MG tablet Take 2 tablets by mouth every 4 (four) hours as needed. (Patient not taking: Reported on 01/15/2016) 10 tablet 0 Not Taking at Unknown time  . Ibuprofen (MIDOL CRAMP FORMULA MAX ST PO) Take 2 tablets by mouth daily as needed (FOR CRAMPS).   Past Month at Unknown time  . ipratropium (ATROVENT) 0.06 % nasal spray Place 2 sprays into both nostrils 4 (four) times daily. (Patient not taking: Reported on 11/28/2014) 15 mL 12 Past Month at Unknown time  . ondansetron (ZOFRAN ODT) 8 MG disintegrating tablet 23m ODT q4 hours prn nausea 6 tablet 0   . oxyCODONE-acetaminophen (PERCOCET/ROXICET) 5-325 MG per tablet Take 1 tablet by mouth every 6 (six) hours as needed for severe  pain. (Patient not taking: Reported on 11/28/2014) 12 tablet 0   . predniSONE (DELTASONE) 10 MG tablet Take 2 tablets (20 mg total) by mouth 2 (two) times daily. 20 tablet 0   . promethazine (PHENERGAN) 12.5 MG tablet Take 1 tablet (12.5 mg total) by mouth every 6 (six) hours as needed for nausea or vomiting. (Patient not taking: Reported on 11/28/2014) 30 tablet 0     Review of Systems  Constitutional: Negative for chills and fever.  Gastrointestinal: Positive for nausea. Negative for vomiting.  Genitourinary: Positive for pelvic pain. Negative for dysuria, frequency, vaginal bleeding and vaginal discharge.  Musculoskeletal: Positive for back pain.   Physical Exam   Blood pressure (!) 152/86, pulse (!) 112, temperature 98 F (36.7 C), temperature source Oral, resp. rate 19, height 5' 5.5" (1.664 m), weight 116.2 kg, last menstrual period 10/12/2017, SpO2 98 %.  Physical Exam  Nursing note and vitals reviewed. Constitutional: She is oriented to person, place, and time. She appears well-developed and well-nourished. No distress.  HENT:  Head: Normocephalic.  Cardiovascular: Normal rate.  Respiratory: Effort normal.  GI: Soft. There is no tenderness. There is no rebound.  Neurological: She is alert and oriented to person, place, and time.  Skin: Skin is warm and dry.  Psychiatric: She has a normal mood and affect.    MAU Course  Procedures  MDM 7:54 PM care turned over to LFatima Blank CNM [place] labs pending  HMarcille Buffy7:55 PM 11/10/17   Assessment and Plan    Results for orders placed or performed during the hospital encounter of 11/10/17 (from the past 24 hour(s))  Urinalysis, Routine w reflex microscopic     Status: Abnormal   Collection Time: 11/10/17  6:33 PM  Result Value Ref Range   Color, Urine YELLOW YELLOW   APPearance CLEAR CLEAR   Specific Gravity, Urine 1.015 1.005 - 1.030   pH 6.0 5.0 - 8.0   Glucose, UA NEGATIVE NEGATIVE mg/dL   Hgb urine  dipstick NEGATIVE NEGATIVE   Bilirubin Urine NEGATIVE NEGATIVE   Ketones, ur 20 (A) NEGATIVE mg/dL   Protein, ur NEGATIVE NEGATIVE mg/dL   Nitrite NEGATIVE NEGATIVE   Leukocytes, UA NEGATIVE NEGATIVE  Pregnancy, urine POC     Status: Abnormal   Collection Time: 11/10/17  6:34 PM  Result Value Ref Range   Preg Test, Ur POSITIVE (A) NEGATIVE  Wet prep, genital     Status: Abnormal   Collection Time: 11/10/17  7:09 PM  Result Value Ref Range   Yeast Wet Prep HPF POC  NONE SEEN NONE SEEN   Trich, Wet Prep NONE SEEN NONE SEEN   Clue Cells Wet Prep HPF POC NONE SEEN NONE SEEN   WBC, Wet Prep HPF POC MODERATE (A) NONE SEEN   Sperm NONE SEEN   CBC     Status: Abnormal   Collection Time: 11/10/17  7:39 PM  Result Value Ref Range   WBC 12.8 (H) 4.0 - 10.5 K/uL   RBC 4.71 3.87 - 5.11 MIL/uL   Hemoglobin 14.6 12.0 - 15.0 g/dL   HCT 42.3 36.0 - 46.0 %   MCV 89.8 78.0 - 100.0 fL   MCH 31.0 26.0 - 34.0 pg   MCHC 34.5 30.0 - 36.0 g/dL   RDW 13.9 11.5 - 15.5 %   Platelets 321 150 - 400 K/uL  hCG, quantitative, pregnancy     Status: Abnormal   Collection Time: 11/10/17  7:39 PM  Result Value Ref Range   hCG, Beta Chain, Quant, S 556 (H) <5 mIU/mL    US Ob Comp Less 14 Wks  Result Date: 11/10/2017 CLINICAL DATA:  Pregnant, lower abdominal cramping x3 days EXAM: OBSTETRIC <14 WK [place] AND TRANSVAGINAL OB US TECHNIQUE: Both transabdominal and transvaginal ultrasound examinations were performed for complete evaluation of the gestation as well as the maternal uterus, adnexal regions, and pelvic cul-de-sac. Transvaginal technique was performed to assess early pregnancy. COMPARISON:  None. FINDINGS: Intrauterine gestational sac: None Yolk sac:  Not Visualized. Embryo:  Not Visualized. Maternal uterus/adnexae: Endometrial complex measures 13 mm. Right ovary is within normal limits, noting a corpus luteal cyst/follicle. Left ovary is within normal limits. Trace pelvic fluid. IMPRESSION: No IUP is visualized.  By definition, in the setting of a positive pregnancy test, this reflects a pregnancy of unknown location. Differential considerations include early normal IUP, abnormal IUP/missed abortion, or nonvisualized ectopic pregnancy. Serial beta HCG is suggested. Consider repeat pelvic ultrasound in 14 days. Electronically Signed   By: Julian Hy M.D.   On: 11/10/2017 20:27   US Ob Transvaginal  Result Date: 11/10/2017 CLINICAL DATA:  Pregnant, lower abdominal cramping x3 days EXAM: OBSTETRIC <14 WK [place] AND TRANSVAGINAL OB US TECHNIQUE: Both transabdominal and transvaginal ultrasound examinations were performed for complete evaluation of the gestation as well as the maternal uterus, adnexal regions, and pelvic cul-de-sac. Transvaginal technique was performed to assess early pregnancy. COMPARISON:  None. FINDINGS: Intrauterine gestational sac: None Yolk sac:  Not Visualized. Embryo:  Not Visualized. Maternal uterus/adnexae: Endometrial complex measures 13 mm. Right ovary is within normal limits, noting a corpus luteal cyst/follicle. Left ovary is within normal limits. Trace pelvic fluid. IMPRESSION: No IUP is visualized. By definition, in the setting of a positive pregnancy test, this reflects a pregnancy of unknown location. Differential considerations include early normal IUP, abnormal IUP/missed abortion, or nonvisualized ectopic pregnancy. Serial beta HCG is suggested. Consider repeat pelvic ultrasound in 14 days. Electronically Signed   By: Julian Hy M.D.   On: 11/10/2017 20:27   MDM: Findings today could represent a normal early pregnancy, spontaneous abortion or ectopic pregnancy which can be life-threatening.  Ectopic precautions were given to the patient with plan to return in 48 hours for repeat quant hcg to evaluate pregnancy development.  A/P:  1. Pregnancy of unknown anatomic location     Return to MAU on Sunday, in 48 hours, for repeat hcg or sooner for emergencies. Ectopic  precautions reviewed.  Fatima Blank, CNM 9:15 PM

## 2017-11-12 ENCOUNTER — Inpatient Hospital Stay (HOSPITAL_COMMUNITY)
Admission: AD | Admit: 2017-11-12 | Discharge: 2017-11-12 | Disposition: A | Discharge status: Home / Self Care | Payer: BLUE CROSS/BLUE SHIELD | Source: Ambulatory Visit | Attending: Obstetrics & Gynecology | Admitting: Obstetrics & Gynecology

## 2017-11-12 DIAGNOSIS — O283 Abnormal ultrasonic finding on antenatal screening of mother: Secondary | ICD-10-CM | POA: Diagnosis not present

## 2017-11-12 DIAGNOSIS — O26891 Other specified pregnancy related conditions, first trimester: Secondary | ICD-10-CM | POA: Insufficient documentation

## 2017-11-12 DIAGNOSIS — O3680X0 Pregnancy with inconclusive fetal viability, not applicable or unspecified: Secondary | ICD-10-CM

## 2017-11-12 DIAGNOSIS — R109 Unspecified abdominal pain: Secondary | ICD-10-CM | POA: Diagnosis not present

## 2017-11-12 DIAGNOSIS — O26899 Other specified pregnancy related conditions, unspecified trimester: Secondary | ICD-10-CM

## 2017-11-12 DIAGNOSIS — Z3A01 Less than 8 weeks gestation of pregnancy: Secondary | ICD-10-CM | POA: Diagnosis not present

## 2017-11-12 LAB — HCG, QUANTITATIVE, PREGNANCY: hCG, Beta Chain, Quant, S: 1041 m[IU]/mL — ABNORMAL HIGH (ref <5)

## 2017-11-12 MED ORDER — DOXYLAMINE-PYRIDOXINE 10-10 MG PO TBEC: DELAYED_RELEASE_TABLET | ORAL | 3 refills | Status: DC

## 2017-11-12 NOTE — MAU Note (Signed)
Here for repeat hcg  No pain or bleeding

## 2017-11-12 NOTE — Discharge Instructions (Signed)
Abdominal Pain During Pregnancy Abdominal pain is common in pregnancy. Most of the time, it does not cause harm. There are many causes of abdominal pain. Some causes are more serious than others and sometimes the cause is not known. Abdominal pain can be a sign that something is very wrong with the pregnancy or the pain may have nothing to do with the pregnancy. Always tell your health care provider if you have any abdominal pain. Follow these instructions at home:  Do not have sex or put anything in your vagina until your symptoms go away completely.  Watch your abdominal pain for any changes.  Get plenty of rest until your pain improves.  Drink enough fluid to keep your urine clear or pale yellow.  Take over-the-counter or prescription medicines only as told by your health care provider.  Keep all follow-up visits as told by your health care provider. This is important. Contact a health care provider if:  You have a fever.  Your pain gets worse or you have cramping.  Your pain continues after resting. Get help right away if:  You are bleeding, leaking fluid, or passing tissue from the vagina.  You have vomiting or diarrhea that does not go away.  You have painful or bloody urination.  You notice a decrease in your baby's movements.  You feel very weak or faint.  You have shortness of breath.  You develop a severe headache with abdominal pain.  You have abnormal vaginal discharge with abdominal pain. This information is not intended to replace advice given to you by your health care provider. Make sure you discuss any questions you have with your health care provider. Document Released: 01/31/2005 Document Revised: 11/12/2015 Document Reviewed: 08/30/2012 Elsevier Interactive Patient Education  [employer].

## 2017-11-12 NOTE — MAU Provider Note (Signed)
History    First Provider Initiated Contact with Patient 11/12/17 1938     Chief Complaint:  Kathleen Hamilton is  22 y.o. G2P0 Patient's last menstrual period was 10/12/2017.Kathleen Hamilton Patient is here for follow up of quantitative HCG and ongoing surveillance of pregnancy status.   She is 5w3dweeks gestation  by LMP.    Since her last visit, the patient is without new complaint.     ROS Abdominal Pain: Denies Vaginal bleeding: none now.   Passage of clots or tissue: Denies Dizziness: Denies  A POS  Her previous Quantitative HCG values are:  11/10/2017: 556  Physical Exam   Patient Vitals for the past 24 hrs:  BP Temp Temp src Pulse Resp Weight  11/12/17 1827 123/81 98.4 F (36.9 C) Oral 91 18 116.9 kg   Constitutional: Well-nourished female in no apparent distress. No pallor Neuro: Alert and oriented 4 Cardiovascular: Normal rate Respiratory: Normal effort and rate Abdomen: Soft, nontender Gynecological Exam: examination not indicated  Labs: Results for orders placed or performed during the hospital encounter of 11/12/17 (from the past 24 hour(s))  hCG, quantitative, pregnancy   Collection Time: 11/12/17  6:16 PM  Result Value Ref Range   hCG, Beta Chain, Quant, S 1,041 (H) <5 mIU/mL    Ultrasound Studies:   [place] Comp Less 14 Wks  Result Date: 11/10/2017 CLINICAL DATA:  Pregnant, lower abdominal cramping x3 days EXAM: OBSTETRIC <14 WK [place] TRANSVAGINAL OB [place]QUE: Both transabdominal and transvaginal ultrasound examinations were performed for complete evaluation of the gestation as well as the maternal uterus, adnexal regions, and pelvic cul-de-sac. Transvaginal technique was performed to assess early pregnancy. COMPARISON:  None. FINDINGS: Intrauterine gestational sac: None Yolk sac:  Not Visualized. Embryo:  Not Visualized. Maternal uterus/adnexae: Endometrial complex measures 13 mm. Right ovary is within normal limits, noting a corpus luteal cyst/follicle.  Left ovary is within normal limits. Trace pelvic fluid. IMPRESSION: No IUP is visualized. By definition, in the setting of a positive pregnancy test, this reflects a pregnancy of unknown location. Differential considerations include early normal IUP, abnormal IUP/missed abortion, or nonvisualized ectopic pregnancy. Serial beta HCG is suggested. Consider repeat pelvic ultrasound in 14 days. Electronically Signed   By: SJulian HyM.D.   On: 11/10/2017 20:27   [place] Transvaginal  Result Date: 11/10/2017 CLINICAL DATA:  Pregnant, lower abdominal cramping x3 days EXAM: OBSTETRIC <14 WK [place] TRANSVAGINAL OB [place]QUE: Both transabdominal and transvaginal ultrasound examinations were performed for complete evaluation of the gestation as well as the maternal uterus, adnexal regions, and pelvic cul-de-sac. Transvaginal technique was performed to assess early pregnancy. COMPARISON:  None. FINDINGS: Intrauterine gestational sac: None Yolk sac:  Not Visualized. Embryo:  Not Visualized. Maternal uterus/adnexae: Endometrial complex measures 13 mm. Right ovary is within normal limits, noting a corpus luteal cyst/follicle. Left ovary is within normal limits. Trace pelvic fluid. IMPRESSION: No IUP is visualized. By definition, in the setting of a positive pregnancy test, this reflects a pregnancy of unknown location. Differential considerations include early normal IUP, abnormal IUP/missed abortion, or nonvisualized ectopic pregnancy. Serial beta HCG is suggested. Consider repeat pelvic ultrasound in 14 days. Electronically Signed   By: SJulian HyM.D.   On: 11/10/2017 20:27    MAU course/MDM: Quantitative hCG ordered  Abd pain in early pregnancy with normal rise in Quant and hemodynamically stable.  Assessment:  488w3deeks gestation w/ normal rise in Quant  Plan: Discharge home in stable  condition. SAB and ectopic precautions Follow-up Information    [facility] Follow up.   Why:  As needed if symptoms worsen Contact information: [address] [phone] [place] [facility] Follow up on 11/15/2017.   Specialty:  Obstetrics and Gynecology Why:  for follow-up US Contact information: [place]. [address] [place] [id number] [phone]         Allergies as of 11/12/2017      Reactions   Aspirin Other (See Comments)   Reaction:  Stomach bleeding    Fish Allergy    Naproxen Other (See Comments)   Reaction:  Stomach bleeding    Nsaids    GI BLEED   Shellfish Allergy Diarrhea, Nausea And Vomiting, Swelling, Rash      Medication List    STOP taking these medications   MIDOL CRAMP FORMULA MAX ST PO     TAKE these medications   acetaminophen 500 MG tablet Commonly known as:  TYLENOL Take 1 tablet (500 mg total) by mouth every 6 (six) hours as needed.   albuterol 108 (90 Base) MCG/ACT inhaler Commonly known as:  PROVENTIL HFA;VENTOLIN HFA Inhale 2 puffs into the lungs 4 (four) times daily. What changed:    when to take this  reasons to take this       Tamala Julian Kathleen Hamilton, Kathleen Hamilton 11/12/2017, 7:24 PM  2/3

## 2017-11-12 NOTE — MAU Note (Signed)
Pt given d/c and follow up instructions by Marlou Porch CNM in triage

## 2017-11-13 LAB — GC/CHLAMYDIA PROBE AMP (~~LOC~~) NOT AT ARMC
CHLAMYDIA, DNA PROBE: NEGATIVE
NEISSERIA GONORRHEA: NEGATIVE

## 2017-11-15 DIAGNOSIS — Z01419 Encounter for gynecological examination (general) (routine) without abnormal findings: Secondary | ICD-10-CM | POA: Diagnosis not present

## 2017-11-15 DIAGNOSIS — Z124 Encounter for screening for malignant neoplasm of cervix: Secondary | ICD-10-CM | POA: Diagnosis not present

## 2017-11-15 DIAGNOSIS — Z6841 Body Mass Index (BMI) 40.0 and over, adult: Secondary | ICD-10-CM | POA: Diagnosis not present

## 2017-11-15 DIAGNOSIS — Z113 Encounter for screening for infections with a predominantly sexual mode of transmission: Secondary | ICD-10-CM | POA: Diagnosis not present

## 2017-12-01 DIAGNOSIS — N925 Other specified irregular menstruation: Secondary | ICD-10-CM | POA: Diagnosis not present

## 2017-12-01 DIAGNOSIS — N898 Other specified noninflammatory disorders of vagina: Secondary | ICD-10-CM | POA: Diagnosis not present

## 2017-12-06 ENCOUNTER — Other Ambulatory Visit: Payer: Self-pay

## 2017-12-06 ENCOUNTER — Encounter (HOSPITAL_COMMUNITY): Payer: Self-pay

## 2017-12-06 ENCOUNTER — Ambulatory Visit (HOSPITAL_COMMUNITY)
Admission: EM | Admit: 2017-12-06 | Discharge: 2017-12-06 | Disposition: A | Discharge status: Home / Self Care | Payer: BLUE CROSS/BLUE SHIELD | Attending: Family Medicine | Admitting: Family Medicine

## 2017-12-06 DIAGNOSIS — R6889 Other general symptoms and signs: Secondary | ICD-10-CM

## 2017-12-06 DIAGNOSIS — H04129 Dry eye syndrome of unspecified lacrimal gland: Secondary | ICD-10-CM | POA: Diagnosis not present

## 2017-12-06 DIAGNOSIS — R448 Other symptoms and signs involving general sensations and perceptions: Secondary | ICD-10-CM

## 2017-12-06 DIAGNOSIS — H5712 Ocular pain, left eye: Secondary | ICD-10-CM | POA: Diagnosis not present

## 2017-12-06 NOTE — ED Triage Notes (Addendum)
Pt states she has a sinus infection.  X 3 days pt is [redacted] week pregnant.

## 2017-12-06 NOTE — Discharge Instructions (Addendum)
You may try using over the counter Claritin for several days. Tylenol is safe to take in pregnancy.

## 2017-12-09 NOTE — ED Provider Notes (Signed)
[place]   [id number] 12/06/17 Arrival Time: 1919  ASSESSMENT & PLAN:  1. Facial pressure    Likely viral URI. Discussed typical duration of symptoms. OTC symptom care as needed. Tylenol ok to take in pregnancy. Ensure adequate fluid intake and rest. May f/u with PCP or here as needed.  Reviewed expectations re: course of current medical issues. Questions answered. Outlined signs and symptoms indicating need for more acute intervention. Patient verbalized understanding. After Visit Summary given.   SUBJECTIVE: History from: patient.  Kathleen Hamilton is a 22 y.o. female who presents with complaint of nasal congestion, post-nasal drainage, and a persistent dry cough. Onset abrupt, 2-3 days ago. Overall with mild fatigue and without body aches. SOB: none. Wheezing: none. Fever: no. Overall normal PO intake without n/v. Sick contacts: no. No specific or significant aggravating or alleviating factors reported. OTC treatment: none reported. Currently 8 months pregnant without complications.  Received flu shot this year: no.  Social History   Tobacco Use  Smoking Status Never Smoker  Smokeless Tobacco Never Used    ROS: As per HPI.   OBJECTIVE:  Vitals:   12/06/17 1942 12/06/17 1944  BP:  123/71  Pulse:  92  Resp:  18  Temp:  98.4 F (36.9 C)  SpO2:  100%  Weight: 116.1 kg     General appearance: alert; appears fatigued HEENT: nasal congestion; clear runny nose; throat irritation secondary to post-nasal drainage Neck: supple without LAD Lungs: unlabored respirations, symmetrical air entry without wheezing; cough: absent Psychological: alert and cooperative; normal mood and affect   Allergies  Allergen Reactions  . Aspirin Other (See Comments)    Reaction:  Stomach bleeding   . Fish Allergy   . Naproxen Other (See Comments)    Reaction:  Stomach bleeding   . Nsaids     GI BLEED  . Shellfish Allergy Diarrhea, Nausea And Vomiting, Swelling and  Rash    Past Medical History:  Diagnosis Date  . Dysmenorrhea   . Endometriosis   . Heart murmur   . Ulcerative colitis ([place])    Family History  Problem Relation Age of Onset  . Diabetes Maternal Grandmother   . Hypertension Maternal Grandmother   . Heart disease Maternal Grandmother   . Breast cancer Maternal Grandmother        unsure age of onset  . Diabetes Paternal Grandmother   . Heart disease Paternal Grandmother   . Hypertension Paternal Grandmother   . Diabetes Paternal Grandfather   . Heart disease Paternal Grandfather   . Hypertension Paternal Grandfather    Social History   Socioeconomic History  . Marital status: Single    Spouse name: Not on file  . Number of children: Not on file  . Years of education: Not on file  . Highest education level: Not on file  Occupational History  . Not on file  Social Needs  . Financial resource strain: Not on file  . Food insecurity:    Worry: Not on file    Inability: Not on file  . Transportation needs:    Medical: Not on file    Non-medical: Not on file  Tobacco Use  . Smoking status: Never Smoker  . Smokeless tobacco: Never Used  Substance and Sexual Activity  . Alcohol use: No  . Drug use: No  . Sexual activity: Yes    Birth control/protection: Condom  Lifestyle  . Physical activity:    Days per week: Not on file  Minutes per session: Not on file  . Stress: Not on file  Relationships  . Social connections:    Talks on phone: Not on file    Gets together: Not on file    Attends religious service: Not on file    Active member of club or organization: Not on file    Attends meetings of clubs or organizations: Not on file    Relationship status: Not on file  . Intimate partner violence:    Fear of current or ex partner: Not on file    Emotionally abused: Not on file    Physically abused: Not on file    Forced sexual activity: Not on file  Other Topics Concern  . Not on file  Social History Narrative    . Not on file           Vanessa Kick, MD 12/09/17 [phone]

## 2017-12-21 ENCOUNTER — Encounter (HOSPITAL_COMMUNITY): Payer: Self-pay

## 2017-12-21 ENCOUNTER — Other Ambulatory Visit: Payer: Self-pay

## 2017-12-21 ENCOUNTER — Ambulatory Visit (HOSPITAL_COMMUNITY)
Admission: EM | Admit: 2017-12-21 | Discharge: 2017-12-21 | Disposition: A | Discharge status: Home / Self Care | Payer: BLUE CROSS/BLUE SHIELD | Attending: Family Medicine | Admitting: Family Medicine

## 2017-12-21 DIAGNOSIS — R350 Frequency of micturition: Secondary | ICD-10-CM | POA: Diagnosis not present

## 2017-12-21 LAB — POCT URINALYSIS DIP (DEVICE)
Bilirubin Urine: NEGATIVE
GLUCOSE, UA: NEGATIVE mg/dL
HGB URINE DIPSTICK: NEGATIVE
Ketones, ur: NEGATIVE mg/dL
LEUKOCYTES UA: NEGATIVE
NITRITE: NEGATIVE
Protein, ur: NEGATIVE mg/dL
Specific Gravity, Urine: 1.02 (ref 1.005–1.030)
UROBILINOGEN UA: 0.2 mg/dL (ref 0.0–1.0)
pH: 5.5 (ref 5.0–8.0)

## 2017-12-21 NOTE — ED Provider Notes (Signed)
Kathleen Hamilton    CSN: [id number] Arrival date & time: 12/21/17  1845     History   Chief Complaint Chief Complaint  Patient presents with  . Urinary Tract Infection    HPI Kathleen Hamilton is a 22 y.o. female.   Kathleen Hamilton presents with concerns about UTI. States her mother received a call today that there were medications to be filled at the pharmacy. Kathleen Hamilton went to the pharmacy and was told that Augmentin was ready to be picked up, had been ordered 1 week ago by her OB for UTI. Kathleen Hamilton had not been notified by her OB that this medication had been indicated, had been seen by them 3 weeks ago. States just more recently had noticed some frequency and small trickles of urine but otherwise no dysuria. Kathleen Hamilton is [redacted] weeks pregnant. Next OB appointment 11/20. Kathleen Hamilton called her OB multiple times today and did not hear back. No fevers. No abdominal pain, back pain or bleeding. Kathleen Hamilton sees [facility]. Hx of dysmenorrhea, endometriosis, UC.     ROS per HPI.      Past Medical History:  Diagnosis Date  . Dysmenorrhea   . Endometriosis   . Heart murmur   . Ulcerative colitis [facility])     Patient Active Problem List   Diagnosis Date Noted  . VISUAL ACUITY, DECREASED 04/19/2010  . OVERWEIGHT 04/15/2010  . DYSMENORRHEA 04/15/2010  . MENORRHAGIA 04/15/2010    Past Surgical History:  Procedure Laterality Date  . BIOPSY BOWEL    . COLON BIOPSY     hx of colon problems.  ? mega colon, awaiting bx results  . NO PAST SURGERIES      OB History    Gravida  2   Para      Term      Preterm      AB      Living  0     SAB      TAB      Ectopic      Multiple      Live Births               Home Medications    Prior to Admission medications   Medication Sig Start Date End Date Taking? Authorizing Provider  acetaminophen (TYLENOL) 500 MG tablet Take 1 tablet (500 mg total) by mouth every 6 (six) hours as needed. Patient not taking: Reported on 11/28/2014  10/07/14   Antonietta Breach, PA-C  albuterol (PROVENTIL HFA;VENTOLIN HFA) 108 (90 BASE) MCG/ACT inhaler Inhale 2 puffs into the lungs 4 (four) times daily. Patient taking differently: Inhale 2 puffs into the lungs every 4 (four) hours as needed for wheezing or shortness of breath.  10/29/13   Harden Mo, MD  Doxylamine-Pyridoxine 10-10 MG TBEC Start with 2 tablets every evening. If symptoms persist, add 1 tablet every morning. If symptoms persist, add 1 tablet at mid-day. 11/12/17   Manya Silvas, CNM    Family History Family History  Problem Relation Age of Onset  . Diabetes Maternal Grandmother   . Hypertension Maternal Grandmother   . Heart disease Maternal Grandmother   . Breast cancer Maternal Grandmother        unsure age of onset  . Diabetes Paternal Grandmother   . Heart disease Paternal Grandmother   . Hypertension Paternal Grandmother   . Diabetes Paternal Grandfather   . Heart disease Paternal Grandfather   . Hypertension Paternal Grandfather     Social History Social History  Tobacco Use  . Smoking status: Never Smoker  . Smokeless tobacco: Never Used  Substance Use Topics  . Alcohol use: No  . Drug use: No     Allergies   Aspirin; Fish allergy; Naproxen; Nsaids; and Shellfish allergy   Review of Systems Review of Systems   Physical Exam Triage Vital Signs ED Triage Vitals  Enc Vitals Group     BP 12/21/17 1910 (!) 141/77     Pulse Rate 12/21/17 1910 73     Resp 12/21/17 1910 18     Temp 12/21/17 1910 97.8 F (36.6 C)     Temp src --      SpO2 12/21/17 1910 100 %     Weight 12/21/17 1909 256 lb (116.1 kg)     Height --      Head Circumference --      Peak Flow --      Pain Score 12/21/17 1913 0     Pain Loc --      Pain Edu? --      Excl. in [place]? --    No data found.  Updated Vital Signs BP (!) 141/77 (BP Location: Right Arm)   Pulse 73   Temp 97.8 F (36.6 C)   Resp 18   Wt 256 lb (116.1 kg)   LMP 10/12/2017   SpO2 100%   BMI 41.95  kg/m   Visual Acuity Right Eye Distance:   Left Eye Distance:   Bilateral Distance:    Right Eye Near:   Left Eye Near:    Bilateral Near:     Physical Exam  Constitutional: Kathleen Hamilton is oriented to person, place, and time. Kathleen Hamilton appears well-developed and well-nourished. No distress.  Cardiovascular: Normal rate, regular rhythm and normal heart sounds.  Pulmonary/Chest: Effort normal and breath sounds normal.  Abdominal: Soft. There is no tenderness. There is no rigidity, no rebound, no guarding and no CVA tenderness.  Genitourinary:  Genitourinary Comments:    Neurological: Kathleen Hamilton is alert and oriented to person, place, and time.  Skin: Skin is warm and dry.     UC Treatments / Results  Labs (all labs ordered are listed, but only abnormal results are displayed) Labs Reviewed  POCT URINALYSIS DIP (DEVICE)    EKG None  Radiology No results found.  Procedures Procedures (including critical care time)  Medications Ordered in UC Medications - No data to display  Initial Impression / Assessment and Plan / UC Course  I have reviewed the triage vital signs and the nursing notes.  Pertinent labs & imaging results that were available during my care of the patient were reviewed by me and considered in my medical decision making (see chart for details).     Completely normal urine here today without attributing symptoms. No known reason for start of augmentin at this time, recommend to call OB tomorrow to follow up on this. Patient verbalized understanding and agreeable to plan.    Final Clinical Impressions(s) / UC Diagnoses   Final diagnoses:  Urinary frequency     Discharge Instructions     Your Urine is completely normal here tonight. I do not have a reason to treat a urinary tract infection.  As you are uncertain the reason for the antibiotic I would wait until you have heard back from your OB as to why to take it. Please call again tomorrow or try to speak to the nurse  in the clinic.     ED Prescriptions  None     Controlled Substance Prescriptions [place] Controlled Substance Registry consulted? Not Applicable   Zigmund Gottron, NP 12/21/17 [phone]

## 2017-12-21 NOTE — ED Triage Notes (Signed)
Pt wants to be checked for an UTI because her PCP and they told her to pick up some antibiotic for a UTI that she was suppose to have 1 week or more ago. But they never made her aware of this til a week later.

## 2017-12-21 NOTE — Discharge Instructions (Signed)
Your Urine is completely normal here tonight. I do not have a reason to treat a urinary tract infection.  As you are uncertain the reason for the antibiotic I would wait until you have heard back from your OB as to why to take it. Please call again tomorrow or try to speak to the nurse in the clinic.

## 2017-12-29 DIAGNOSIS — Z3481 Encounter for supervision of other normal pregnancy, first trimester: Secondary | ICD-10-CM | POA: Diagnosis not present

## 2017-12-29 DIAGNOSIS — Z23 Encounter for immunization: Secondary | ICD-10-CM | POA: Diagnosis not present

## 2017-12-29 DIAGNOSIS — N390 Urinary tract infection, site not specified: Secondary | ICD-10-CM | POA: Diagnosis not present

## 2017-12-29 LAB — OB RESULTS CONSOLE HEPATITIS B SURFACE ANTIGEN: Hepatitis B Surface Ag: NEGATIVE

## 2017-12-29 LAB — OB RESULTS CONSOLE RUBELLA ANTIBODY, IGM: Rubella: IMMUNE

## 2017-12-29 LAB — OB RESULTS CONSOLE HIV ANTIBODY (ROUTINE TESTING): HIV: NONREACTIVE

## 2018-01-05 DIAGNOSIS — Z3481 Encounter for supervision of other normal pregnancy, first trimester: Secondary | ICD-10-CM | POA: Diagnosis not present

## 2018-01-12 DIAGNOSIS — N390 Urinary tract infection, site not specified: Secondary | ICD-10-CM | POA: Diagnosis not present

## 2018-02-02 DIAGNOSIS — Z361 Encounter for antenatal screening for raised alphafetoprotein level: Secondary | ICD-10-CM | POA: Diagnosis not present

## 2018-02-02 DIAGNOSIS — Z3481 Encounter for supervision of other normal pregnancy, first trimester: Secondary | ICD-10-CM | POA: Diagnosis not present

## 2018-02-14 NOTE — L&D Delivery Note (Signed)
Operative Delivery Note At 6:27 PM a viable and healthy female was delivered via Vaginal, Vacuum Neurosurgeon).  Presentation: vertex; Position: Right,, Occiput,, Anterior; Station: +3.  Verbal consent: obtained from patient.  Risks and benefits discussed in detail.  Risks include, but are not limited to the risks of anesthesia, bleeding, infection, damage to maternal tissues, fetal cephalhematoma.  There is also the risk of inability to effect vaginal delivery of the head, or shoulder dystocia that cannot be resolved by established maneuvers, leading to the need for emergency cesarean section.  Indication: terminal bradycardia APGAR: 2, 8; weight 5 lb 0.8 oz (2290 g).   Placenta status:spontaneous intact sent to path , .   Cord:  with the following complications: .  Cord pH: 7.10  Anesthesia: epidural, local  Instruments: mushroom Episiotomy: None Lacerations: 2nd degree perineal ; right Vaginal sulcus Suture Repair: 3.0 chromic Est. Blood Loss (mL):    Mom to postpartum.  Baby to Couplet care / Skin to Skin.  Janiaya Ryser A Kelley Polinsky 07/11/2018, 7:08 PM

## 2018-02-23 DIAGNOSIS — Z3482 Encounter for supervision of other normal pregnancy, second trimester: Secondary | ICD-10-CM | POA: Diagnosis not present

## 2018-02-23 DIAGNOSIS — Z363 Encounter for antenatal screening for malformations: Secondary | ICD-10-CM | POA: Diagnosis not present

## 2018-02-23 DIAGNOSIS — R002 Palpitations: Secondary | ICD-10-CM | POA: Diagnosis not present

## 2018-03-05 ENCOUNTER — Other Ambulatory Visit: Payer: Self-pay

## 2018-03-05 ENCOUNTER — Inpatient Hospital Stay (HOSPITAL_COMMUNITY)
Admission: AD | Admit: 2018-03-05 | Discharge: 2018-03-05 | Disposition: A | Discharge status: Home / Self Care | Payer: BLUE CROSS/BLUE SHIELD | Attending: Obstetrics and Gynecology | Admitting: Obstetrics and Gynecology

## 2018-03-05 DIAGNOSIS — R21 Rash and other nonspecific skin eruption: Secondary | ICD-10-CM | POA: Diagnosis not present

## 2018-03-05 DIAGNOSIS — B373 Candidiasis of vulva and vagina: Secondary | ICD-10-CM | POA: Diagnosis not present

## 2018-03-05 DIAGNOSIS — L282 Other prurigo: Secondary | ICD-10-CM | POA: Diagnosis not present

## 2018-03-05 DIAGNOSIS — N898 Other specified noninflammatory disorders of vagina: Secondary | ICD-10-CM | POA: Diagnosis not present

## 2018-03-05 DIAGNOSIS — L299 Pruritus, unspecified: Secondary | ICD-10-CM | POA: Diagnosis not present

## 2018-03-05 NOTE — MAU Note (Signed)
Urine sent to lab 

## 2018-03-05 NOTE — MAU Note (Signed)
Pt in triage for c/o rash. She was seen in the office today for same complaint and has an appt with derm tomorrow. Pt has a chicken pox vaccine.  Pt advised visit here is not going to add anything to her condition. Call derm office to see if she can be seen sooner than 2:15 pm. Comfort measures such as aveeno bath, benadryl recommended.

## 2018-03-05 NOTE — MAU Note (Addendum)
2 days ago, had noted a few bumps on her belly. Woke up yesterday and there was more, was itching and started to spread.  Is on belly, thighs,arms, inside of palms, low back.  Went to dr today.  Didn't see her regular dr.   The dr, that saw her didn't know.  2 other docs saw her, and didn't know- but said it wasn't chicken pox.   Couldn't get in to dermatologist today.  Woke up from nap with hot flashes, chills and headache.  (raised red spots on abd, arms, thighs, back, palms- itch burn and hurts)

## 2018-03-05 NOTE — Discharge Instructions (Signed)
aveeno bath.  Calamine lotion.  Tylenol and Benadryl per box instructions for discomfort.  Keep appointment as scheduled with Dermatologist for tomorrow Rash, Adult  A rash is a change in the color of your skin. A rash can also change the way your skin feels. There are many different conditions and factors that can cause a rash. Follow these instructions at home: The goal of treatment is to stop the itching and keep the rash from spreading. Watch for any changes in your symptoms. Let your doctor know about them. Follow these instructions to help with your condition: Medicine Take or apply over-the-counter and prescription medicines only as told by your doctor. These may include medicines:  To treat red or swollen skin (corticosteroid creams).  To treat itching.  To treat an allergy (oral antihistamines).  To treat very bad symptoms (oral corticosteroids).  Skin care  Put cool cloths (compresses) on the affected areas.  Do not scratch or rub your skin.  Avoid covering the rash. Make sure that the rash is exposed to air as much as possible. Managing itching and discomfort  Avoid hot showers or baths. These can make itching worse. A cold shower may help.  Try taking a bath with: ? Epsom salts. You can get these at your local pharmacy or grocery store. Follow the instructions on the package. ? Baking soda. Pour a small amount into the bath as told by your doctor. ? Colloidal oatmeal. You can get this at your local pharmacy or grocery store. Follow the instructions on the package.  Try putting baking soda paste onto your skin. Stir water into baking soda until it gets like a paste.  Try putting on a lotion that relieves itchiness (calamine lotion).  Keep cool and out of the sun. Sweating and being hot can make itching worse. General instructions   Rest as needed.  Drink enough fluid to keep your pee (urine) pale yellow.  Wear loose-fitting clothing.  Avoid scented soaps,  detergents, and perfumes. Use gentle soaps, detergents, perfumes, and other cosmetic products.  Avoid anything that causes your rash. Keep a journal to help track what causes your rash. Write down: ? What you eat. ? What cosmetic products you use. ? What you drink. ? What you wear. This includes jewelry.  Keep all follow-up visits as told by your doctor. This is important. Contact a doctor if:  You sweat at night.  You lose weight.  You pee (urinate) more than normal.  You pee less than normal, or you notice that your pee is a darker color than normal.  You feel weak.  You throw up (vomit).  Your skin or the whites of your eyes look yellow (jaundice).  Your skin: ? Tingles. ? Is numb.  Your rash: ? Does not go away after a few days. ? Gets worse.  You are: ? More thirsty than normal. ? More tired than normal.  You have: ? New symptoms. ? Pain in your belly (abdomen). ? A fever. ? Watery poop (diarrhea). Get help right away if:  You have a fever and your symptoms suddenly get worse.  You start to feel mixed up (confused).  You have a very bad headache or a stiff neck.  You have very bad joint pains or stiffness.  You have jerky movements that you cannot control (seizure).  Your rash covers all or most of your body. The rash may or may not be painful.  You have blisters that: ? Are on top of  the rash. ? Grow larger. ? Grow together. ? Are painful. ? Are inside your nose or mouth.  You have a rash that: ? Looks like purple pinprick-sized spots all over your body. ? Has a "bull's eye" or looks like a target. ? Is red and painful, causes your skin to peel, and is not from being in the sun too long. Summary  A rash is a change in the color of your skin. A rash can also change the way your skin feels.  The goal of treatment is to stop the itching and keep the rash from spreading.  Take or apply over-the-counter and prescription medicines only as told  by your doctor.  Contact a doctor if you have new symptoms or symptoms that get worse.  Keep all follow-up visits as told by your doctor. This is important. This information is not intended to replace advice given to you by your health care provider. Make sure you discuss any questions you have with your health care provider. Document Released: 07/20/2007 Document Revised: 09/04/2017 Document Reviewed: 09/04/2017 Elsevier Interactive Patient Education  2019 [employer].

## 2018-03-06 ENCOUNTER — Other Ambulatory Visit: Payer: Self-pay | Admitting: Obstetrics and Gynecology

## 2018-03-06 DIAGNOSIS — Z23 Encounter for immunization: Secondary | ICD-10-CM | POA: Diagnosis not present

## 2018-03-06 DIAGNOSIS — L309 Dermatitis, unspecified: Secondary | ICD-10-CM | POA: Diagnosis not present

## 2018-03-08 DIAGNOSIS — L309 Dermatitis, unspecified: Secondary | ICD-10-CM | POA: Diagnosis not present

## 2018-03-12 DIAGNOSIS — R21 Rash and other nonspecific skin eruption: Secondary | ICD-10-CM | POA: Diagnosis not present

## 2018-03-13 ENCOUNTER — Emergency Department (HOSPITAL_COMMUNITY): Payer: BLUE CROSS/BLUE SHIELD

## 2018-03-13 ENCOUNTER — Other Ambulatory Visit: Payer: Self-pay

## 2018-03-13 ENCOUNTER — Emergency Department (HOSPITAL_COMMUNITY)
Admission: EM | Admit: 2018-03-13 | Discharge: 2018-03-14 | Disposition: A | Discharge status: Home / Self Care | Payer: BLUE CROSS/BLUE SHIELD | Attending: Emergency Medicine | Admitting: Emergency Medicine

## 2018-03-13 DIAGNOSIS — R079 Chest pain, unspecified: Secondary | ICD-10-CM | POA: Diagnosis not present

## 2018-03-13 DIAGNOSIS — R072 Precordial pain: Secondary | ICD-10-CM | POA: Insufficient documentation

## 2018-03-13 DIAGNOSIS — Z79899 Other long term (current) drug therapy: Secondary | ICD-10-CM | POA: Diagnosis not present

## 2018-03-13 DIAGNOSIS — Z3A22 22 weeks gestation of pregnancy: Secondary | ICD-10-CM | POA: Diagnosis not present

## 2018-03-13 DIAGNOSIS — O9989 Other specified diseases and conditions complicating pregnancy, childbirth and the puerperium: Secondary | ICD-10-CM | POA: Diagnosis not present

## 2018-03-13 LAB — CBC
HCT: 38.5 % (ref 36.0–46.0)
HEMOGLOBIN: 13.1 g/dL (ref 12.0–15.0)
MCH: 30.2 pg (ref 26.0–34.0)
MCHC: 34 g/dL (ref 30.0–36.0)
MCV: 88.7 fL (ref 80.0–100.0)
Platelets: 405 10*3/uL — ABNORMAL HIGH (ref 150–400)
RBC: 4.34 MIL/uL (ref 3.87–5.11)
RDW: 13.3 % (ref 11.5–15.5)
WBC: 14.1 10*3/uL — AB (ref 4.0–10.5)
nRBC: 0 % (ref 0.0–0.2)

## 2018-03-13 LAB — BASIC METABOLIC PANEL
ANION GAP: 9 (ref 5–15)
BUN: 5 mg/dL — ABNORMAL LOW (ref 6–20)
CO2: 20 mmol/L — ABNORMAL LOW (ref 22–32)
Calcium: 9.3 mg/dL (ref 8.9–10.3)
Chloride: 108 mmol/L (ref 98–111)
Creatinine, Ser: 0.35 mg/dL — ABNORMAL LOW (ref 0.44–1.00)
GFR calc Af Amer: >60 mL/min (ref >60)
Glucose, Bld: 98 mg/dL (ref 70–99)
POTASSIUM: 3.6 mmol/L (ref 3.5–5.1)
SODIUM: 137 mmol/L (ref 135–145)

## 2018-03-13 LAB — I-STAT TROPONIN, ED: TROPONIN I, POC: 0 ng/mL (ref 0.00–0.08)

## 2018-03-13 MED ORDER — SODIUM CHLORIDE 0.9% FLUSH: 3.0000 mL | Freq: Once | INTRAVENOUS | Status: DC

## 2018-03-13 NOTE — ED Triage Notes (Signed)
Patient c/o chest pain. Has an appt to see cardiologist on Friday due to palpitations. States that pain radiates from chest to neck. Pt is 21 weeks 5 days pregnant.

## 2018-03-14 MED ORDER — ALUM & MAG HYDROXIDE-SIMETH 200-200-20 MG/5ML PO SUSP
30.0000 mL | Freq: Once | ORAL | Status: AC
  Administered 2018-03-14: 30 mL via ORAL
  Filled 2018-03-14: qty 30

## 2018-03-14 NOTE — ED Provider Notes (Signed)
[facility] DEPARTMENT Provider Note  CSN: [id number] Arrival date & time: 03/13/18 1926  Chief Complaint(s) Chest Pain  HPI Kathleen Hamilton is a 23 y.o. female G2 P0 currently at 21 weeks and 6 days who presents to the emergency department with 2 days of constant substernal chest pain described as tightness with intermittent spasms that lasts few minutes at a time.  No alleviating or aggravating factors.  No associated shortness of breath.  No recent fevers or infections.  No coughing or congestion.  No abdominal pain.  No emesis or diarrhea.  HPI  Past Medical History Past Medical History:  Diagnosis Date  . Dysmenorrhea   . Endometriosis   . Heart murmur   . Ulcerative colitis [facility])    Patient Active Problem List   Diagnosis Date Noted  . VISUAL ACUITY, DECREASED 04/19/2010  . OVERWEIGHT 04/15/2010  . DYSMENORRHEA 04/15/2010  . MENORRHAGIA 04/15/2010   Home Medication(s) Prior to Admission medications   Medication Sig Start Date End Date Taking? Authorizing Provider  pantoprazole (PROTONIX) 40 MG tablet Take 40 mg by mouth daily. 02/23/18  Yes [provider]  Prenatal Vit-Fe Fumarate-FA (PRENATAL MULTIVITAMIN) TABS tablet Take 1 tablet by mouth daily at 12 noon.   Yes [provider]  acetaminophen (TYLENOL) 500 MG tablet Take 1 tablet (500 mg total) by mouth every 6 (six) hours as needed. Patient not taking: Reported on 11/28/2014 10/07/14   Antonietta Breach, PA-C  albuterol (PROVENTIL HFA;VENTOLIN HFA) 108 (90 BASE) MCG/ACT inhaler Inhale 2 puffs into the lungs 4 (four) times daily. Patient not taking: Reported on 03/14/2018 10/29/13   Harden Mo, MD  Doxylamine-Pyridoxine 10-10 MG TBEC Start with 2 tablets every evening. If symptoms persist, add 1 tablet every morning. If symptoms persist, add 1 tablet at mid-day. Patient not taking: Reported on 03/14/2018 11/12/17   Manya Silvas, CNM                                                                                         Past Surgical History Past Surgical History:  Procedure Laterality Date  . BIOPSY BOWEL    . COLON BIOPSY     hx of colon problems.  ? mega colon, awaiting bx results  . NO PAST SURGERIES     Family History Family History  Problem Relation Age of Onset  . Diabetes Maternal Grandmother   . Hypertension Maternal Grandmother   . Heart disease Maternal Grandmother   . Breast cancer Maternal Grandmother        unsure age of onset  . Diabetes Paternal Grandmother   . Heart disease Paternal Grandmother   . Hypertension Paternal Grandmother   . Diabetes Paternal Grandfather   . Heart disease Paternal Grandfather   . Hypertension Paternal Grandfather     Social History Social History   Tobacco Use  . Smoking status: Never Smoker  . Smokeless tobacco: Never Used  Substance Use Topics  . Alcohol use: No  . Drug use: No   Allergies Aspirin; Fish allergy; Naproxen; Nsaids; and Shellfish allergy  Review of Systems Review of Systems All other systems are reviewed and  are negative for acute change except as noted in the HPI  Physical Exam Vital Signs  I have reviewed the triage vital signs BP 133/68   Pulse 83   Temp 98 F (36.7 C) (Oral)   Resp 19   LMP 10/12/2017   SpO2 100%   Fetal heart tones in 150s  Physical Exam Vitals signs reviewed.  Constitutional:      General: She is not in acute distress.    Appearance: She is well-developed. She is not diaphoretic.  HENT:     Head: Normocephalic and atraumatic.     Nose: Nose normal.  Eyes:     General: No scleral icterus.       Right eye: No discharge.        Left eye: No discharge.     Conjunctiva/sclera: Conjunctivae normal.     Pupils: Pupils are equal, round, and reactive to light.  Neck:     Musculoskeletal: Normal range of motion and neck supple.  Cardiovascular:     Rate and Rhythm: Normal rate and regular rhythm.      Heart sounds: No murmur. No friction rub. No gallop.   Pulmonary:     Effort: Pulmonary effort is normal. No respiratory distress.     Breath sounds: Normal breath sounds. No stridor. No rales.  Chest:     Chest wall: Tenderness present.    Abdominal:     General: There is no distension.     Palpations: Abdomen is soft.     Tenderness: There is no abdominal tenderness.  Musculoskeletal:        General: No tenderness.  Skin:    General: Skin is warm and dry.     Findings: No erythema or rash.  Neurological:     Mental Status: She is alert and oriented to person, place, and time.     ED Results and Treatments Labs (all labs ordered are listed, but only abnormal results are displayed) Labs Reviewed  BASIC METABOLIC PANEL - Abnormal; Notable for the following components:      Result Value   CO2 20 (*)    BUN 5 (*)    Creatinine, Ser 0.35 (*)    All other components within normal limits  CBC - Abnormal; Notable for the following components:   WBC 14.1 (*)    Platelets 405 (*)    All other components within normal limits  I-STAT TROPONIN, ED                                                                                                                         EKG  EKG Interpretation  Date/Time:  Tuesday March 13 2018 19:49:17 EST Ventricular Rate:  94 PR Interval:  124 QRS Duration: 96 QT Interval:  326 QTC Calculation: 407 R Axis:   32 Text Interpretation:  Normal sinus rhythm Possible Left atrial enlargement Borderline ECG No significant change since last tracing Confirmed by Addison Lank [phone]) on 03/14/2018 5:19:21 AM  Also confirmed by Addison Lank [phone]), editor Philomena Doheny [phone])  on 03/14/2018 7:10:03 AM      Radiology Dg Chest 2 View  Result Date: 03/13/2018 CLINICAL DATA:  Mid chest pain and pain under right and left breasts with SOB beginning yesterday, progressing today. Pain feels like "someone is cutting upward in chest" and pain radiates into  right jaw. Today SOB progressed.Hx of Palpitations and SOB during this pregnancy. ([redacted] weeks pregnant) EXAM: CHEST - 2 VIEW COMPARISON:  02/15/2014 FINDINGS: The heart size and mediastinal contours are within normal limits. Both lungs are clear. No pleural effusion or pneumothorax. The visualized skeletal structures are unremarkable. IMPRESSION: Normal chest radiographs. Electronically Signed   By: Lajean Manes M.D.   On: 03/13/2018 20:33   Pertinent labs & imaging results that were available during my care of the patient were reviewed by me and considered in my medical decision making (see chart for details).  Medications Ordered in ED Medications  sodium chloride flush (NS) 0.9 % injection 3 mL (has no administration in time range)  alum & mag hydroxide-simeth (MAALOX/MYLANTA) 200-200-20 MG/5ML suspension 30 mL (30 mLs Oral Given 03/14/18 0600)                                                                                                                                    Procedures Procedures  (including critical care time)  Medical Decision Making / ED Course I have reviewed the nursing notes for this encounter and the patient's prior records (if available in EHR or on provided paperwork).    Highly atypical chest pain.  Most suspicious for chest wall versus gastritis with esophageal spasms.  Abdomen benign on exam.  EKG without acute ischemic changes or evidence of pericarditis.  Troponin negative.  Given the duration of patient's symptoms this is sufficient to rule out ACS.  Doubt pulmonary embolism.  Presentation not classic for aortic dissection or esophageal perforation.  Chest x-ray without evidence suggestive of pneumonia, pneumothorax, pneumomediastinum.  No abnormal contour of the mediastinum to suggest dissection. No evidence of acute injuries.  Rest of the work-up was reassuring revealing baseline leukocytosis without anemia.  No significant electrolyte derangements or renal  insufficiency.  maalox improved pain.  The patient appears reasonably screened and/or stabilized for discharge and I doubt any other medical condition or other [facility] requiring further screening, evaluation, or treatment in the ED at this time prior to discharge.  The patient is safe for discharge with strict return precautions.  Final Clinical Impression(s) / ED Diagnoses Final diagnoses:  Precordial chest pain   Disposition: Discharge  Condition: Good  I have discussed the results, Dx and Tx plan with the patient who expressed understanding and agree(s) with the plan. Discharge instructions discussed at great length. The patient was given strict return precautions who verbalized understanding of the instructions. No further questions at time of discharge.    ED Discharge Orders  None       Follow Up: Primary care provider  Schedule an appointment as soon as possible for a visit  As needed, If symptoms do not improve or  worsen      This chart was dictated using voice recognition software.  Despite best efforts to proofread,  errors can occur which can change the documentation meaning.   Fatima Blank, MD 03/14/18 [phone]

## 2018-03-14 NOTE — ED Notes (Signed)
Patient verbalized understanding of dc instructions, vss, ambulatory upon discharge. Pt provided water per her request.

## 2018-03-14 NOTE — ED Notes (Signed)
ED Provider at bedside. 

## 2018-03-16 DIAGNOSIS — R002 Palpitations: Secondary | ICD-10-CM | POA: Diagnosis not present

## 2018-03-18 ENCOUNTER — Other Ambulatory Visit: Payer: Self-pay | Admitting: Cardiology

## 2018-03-18 DIAGNOSIS — R002 Palpitations: Secondary | ICD-10-CM

## 2018-03-18 DIAGNOSIS — R0609 Other forms of dyspnea: Principal | ICD-10-CM

## 2018-03-22 DIAGNOSIS — Z362 Encounter for other antenatal screening follow-up: Secondary | ICD-10-CM | POA: Diagnosis not present

## 2018-03-22 DIAGNOSIS — Z3482 Encounter for supervision of other normal pregnancy, second trimester: Secondary | ICD-10-CM | POA: Diagnosis not present

## 2018-03-25 DIAGNOSIS — R002 Palpitations: Secondary | ICD-10-CM | POA: Diagnosis not present

## 2018-03-29 DIAGNOSIS — O2342 Unspecified infection of urinary tract in pregnancy, second trimester: Secondary | ICD-10-CM | POA: Diagnosis not present

## 2018-03-29 DIAGNOSIS — Z6824 Body mass index (BMI) 24.0-24.9, adult: Secondary | ICD-10-CM | POA: Diagnosis not present

## 2018-03-29 DIAGNOSIS — O26899 Other specified pregnancy related conditions, unspecified trimester: Secondary | ICD-10-CM | POA: Diagnosis not present

## 2018-03-29 DIAGNOSIS — Z3482 Encounter for supervision of other normal pregnancy, second trimester: Secondary | ICD-10-CM | POA: Diagnosis not present

## 2018-04-06 ENCOUNTER — Other Ambulatory Visit: Payer: Self-pay

## 2018-04-20 DIAGNOSIS — Z3689 Encounter for other specified antenatal screening: Secondary | ICD-10-CM | POA: Diagnosis not present

## 2018-04-20 DIAGNOSIS — Z3482 Encounter for supervision of other normal pregnancy, second trimester: Secondary | ICD-10-CM | POA: Diagnosis not present

## 2018-04-23 ENCOUNTER — Other Ambulatory Visit: Payer: BLUE CROSS/BLUE SHIELD

## 2018-04-27 ENCOUNTER — Ambulatory Visit: Payer: BLUE CROSS/BLUE SHIELD

## 2018-04-27 ENCOUNTER — Ambulatory Visit: Payer: BLUE CROSS/BLUE SHIELD | Admitting: Cardiology

## 2018-04-27 ENCOUNTER — Other Ambulatory Visit: Payer: Self-pay

## 2018-04-27 DIAGNOSIS — R0609 Other forms of dyspnea: Secondary | ICD-10-CM | POA: Diagnosis not present

## 2018-04-27 DIAGNOSIS — R002 Palpitations: Secondary | ICD-10-CM | POA: Diagnosis not present

## 2018-04-27 DIAGNOSIS — Z3689 Encounter for other specified antenatal screening: Secondary | ICD-10-CM | POA: Diagnosis not present

## 2018-04-27 DIAGNOSIS — Z3A28 28 weeks gestation of pregnancy: Secondary | ICD-10-CM | POA: Diagnosis not present

## 2018-04-27 DIAGNOSIS — O99810 Abnormal glucose complicating pregnancy: Secondary | ICD-10-CM | POA: Diagnosis not present

## 2018-05-03 DIAGNOSIS — Z3483 Encounter for supervision of other normal pregnancy, third trimester: Secondary | ICD-10-CM | POA: Diagnosis not present

## 2018-05-03 DIAGNOSIS — D573 Sickle-cell trait: Secondary | ICD-10-CM | POA: Diagnosis not present

## 2018-05-03 DIAGNOSIS — Z23 Encounter for immunization: Secondary | ICD-10-CM | POA: Diagnosis not present

## 2018-05-14 ENCOUNTER — Encounter: Payer: Self-pay | Admitting: Cardiology

## 2018-05-14 ENCOUNTER — Ambulatory Visit: Payer: BLUE CROSS/BLUE SHIELD | Admitting: Cardiology

## 2018-05-14 ENCOUNTER — Other Ambulatory Visit: Payer: Self-pay

## 2018-05-14 VITALS — BP 119/75 | HR 77 | Temp 98.2°F | Ht 65.5 in | Wt 264.0 lb

## 2018-05-14 DIAGNOSIS — Z3A30 30 weeks gestation of pregnancy: Secondary | ICD-10-CM

## 2018-05-14 DIAGNOSIS — R0609 Other forms of dyspnea: Secondary | ICD-10-CM | POA: Diagnosis not present

## 2018-05-14 DIAGNOSIS — R002 Palpitations: Secondary | ICD-10-CM | POA: Diagnosis not present

## 2018-05-14 NOTE — Progress Notes (Signed)
Subjective:   Kathleen Hamilton, female    DOB: Feb 25, 1995, 23 y.o.   MRN: [id number]  Patient, No Pcp Per:  Chief Complaint  Patient presents with  . Palpitations    f/u echo, palps, DOE    HPI: Kathleen Hamilton  is a 23 y.o. female currently [redacted] weeks pregnant G2P0 female with a past medical history significant for GERD recently evaluated by [place] for palpitations. Patient has an extensive family history of cardiac issues on her mother's side with premature CAD and valve issues. Patient is unsure about father's medical history. Patient denies a history of tobacco and alcohol use.  She underwent 2 week event monitor that revealed sinus rhythm with occasional sinus tachycardia episodes and also echocardiogram on 04/27/2018 that was essentially normal. She now presents to discuss results.   Patient continues to describe her palpitations as a racing heart rate with occasional skipped beats, but does feel that these have improved since last seen by [place]. Palpitations are associated with shortness of breath and typically last anywhere from 1 to 5 minutes and sometimes chest pain. Palpitations start suddenly and taper off. She denies any known triggers and admits her palpitations can happen at rest or during activity, but rarely when laying down.    Past Medical History:  Diagnosis Date  . Dysmenorrhea   . Endometriosis   . Heart murmur   . Ulcerative colitis [facility])     Past Surgical History:  Procedure Laterality Date  . BIOPSY BOWEL    . COLON BIOPSY     hx of colon problems.  ? mega colon, awaiting bx results  . NO PAST SURGERIES      Family History  Problem Relation Age of Onset  . Diabetes Maternal Grandmother   . Hypertension Maternal Grandmother   . Heart disease Maternal Grandmother   . Breast cancer Maternal Grandmother        unsure age of onset  . Diabetes Paternal Grandmother   . Heart disease Paternal Grandmother   . Hypertension Paternal Grandmother   . Diabetes  Paternal Grandfather   . Heart disease Paternal Grandfather   . Hypertension Paternal Grandfather     Social History   Socioeconomic History  . Marital status: Single    Spouse name: Not on file  . Number of children: Not on file  . Years of education: Not on file  . Highest education level: Not on file  Occupational History  . Not on file  Social Needs  . Financial resource strain: Not on file  . Food insecurity:    Worry: Not on file    Inability: Not on file  . Transportation needs:    Medical: Not on file    Non-medical: Not on file  Tobacco Use  . Smoking status: Never Smoker  . Smokeless tobacco: Never Used  Substance and Sexual Activity  . Alcohol use: No  . Drug use: No  . Sexual activity: Yes    Birth control/protection: Condom  Lifestyle  . Physical activity:    Days per week: Not on file    Minutes per session: Not on file  . Stress: Not on file  Relationships  . Social connections:    Talks on phone: Not on file    Gets together: Not on file    Attends religious service: Not on file    Active member of club or organization: Not on file    Attends meetings of clubs or organizations: Not on  file    Relationship status: Not on file  . Intimate partner violence:    Fear of current or ex partner: Not on file    Emotionally abused: Not on file    Physically abused: Not on file    Forced sexual activity: Not on file  Other Topics Concern  . Not on file  Social History Narrative  . Not on file    Current Meds  Medication Sig  . acetaminophen (TYLENOL) 500 MG tablet Take 1 tablet (500 mg total) by mouth every 6 (six) hours as needed.  . pantoprazole (PROTONIX) 40 MG tablet Take 40 mg by mouth daily.  . Prenatal Vit-Fe Fumarate-FA (PRENATAL MULTIVITAMIN) TABS tablet Take 1 tablet by mouth daily at 12 noon.     Review of Systems  Constitution: Negative for decreased appetite, malaise/fatigue, weight gain and weight loss.  Eyes: Negative for visual  disturbance.  Cardiovascular: Positive for dyspnea on exertion and palpitations. Negative for chest pain, claudication, leg swelling, orthopnea and syncope.  Respiratory: Negative for hemoptysis and wheezing.   Endocrine: Negative for cold intolerance and heat intolerance.  Hematologic/Lymphatic: Does not bruise/bleed easily.  Skin: Negative for nail changes.  Musculoskeletal: Negative for muscle weakness and myalgias.  Gastrointestinal: Negative for abdominal pain, change in bowel habit, nausea and vomiting.  Neurological: Negative for difficulty with concentration, dizziness, focal weakness and headaches.  Psychiatric/Behavioral: Negative for altered mental status and suicidal ideas.  All other systems reviewed and are negative.      Objective:     Blood pressure 119/75, pulse 77, temperature 98.2 F (36.8 C), height 5' 5.5" (1.664 m), weight 264 lb (119.7 kg), last menstrual period 10/12/2017, SpO2 99 %.  Cardiac studies:  Echocardiogram 04/27/2018: Left ventricle cavity is normal in size. Normal left ventricular wall thickness. Normal global wall motion. Normal diastolic filling pattern. Calculated EF 60%. No significant valvular abnormality. Inadequate TR jet to estimate pulmonary artery systolic pressure. Normal right atrial pressure.  10 day event monitor 01/31-02/10/2018: NSR/sinus arrhythmia. Occasional sinus tachycardia with fastest episode being 137 bpm. No symptoms reported.   EKG 03/16/2018: Normal sinus rhythm at rate of 92 bpm, normal axis.  No evidence of ischemia, normal EKG.  Recent Labs: BMET    Component Value Date/Time   NA 137 03/13/2018 1956   K 3.6 03/13/2018 1956   CL 108 03/13/2018 1956   CO2 20 (L) 03/13/2018 1956   GLUCOSE 98 03/13/2018 1956   BUN 5 (L) 03/13/2018 1956   CREATININE 0.35 (L) 03/13/2018 1956   CALCIUM 9.3 03/13/2018 1956   GFRNONAA >60 03/13/2018 1956   GFRAA >60 03/13/2018 1956   CBC    Component Value Date/Time   WBC 14.1  (H) 03/13/2018 1956   RBC 4.34 03/13/2018 1956   HGB 13.1 03/13/2018 1956   HCT 38.5 03/13/2018 1956   PLT 405 (H) 03/13/2018 1956   MCV 88.7 03/13/2018 1956   MCH 30.2 03/13/2018 1956   MCHC 34.0 03/13/2018 1956   RDW 13.3 03/13/2018 1956   LYMPHSABS 1.9 11/28/2014 0056   MONOABS 0.6 11/28/2014 0056   EOSABS 0.0 11/28/2014 0056   BASOSABS 0.0 11/28/2014 0056     Physical Exam  Constitutional: She is oriented to person, place, and time. Vital signs are normal. She appears well-developed and well-nourished.  HENT:  Head: Normocephalic and atraumatic.  Neck: Normal range of motion. Neck supple.  Cardiovascular: Normal rate, regular rhythm, normal heart sounds and intact distal pulses.  Pulmonary/Chest: Effort normal and breath  sounds normal. No accessory muscle usage. No respiratory distress.  Abdominal:  [redacted] weeks pregnant  Musculoskeletal: Normal range of motion.  Neurological: She is alert and oriented to person, place, and time.  Skin: Skin is warm and dry.  Vitals reviewed.          Assessment & Recommendations:  1. Palpitations I have discussed event monitor results with the patient, NSR/sinus arrhythmia. Occasional episodes of sinus tachycardia. No reported symptoms. Her symptoms are suggestive of PVC's and sinus tachycardia. She feels that her symptoms have improved. I feel that her symptoms are likely related to current pregnancy state. No SVT was noted. Will continue with watchful waiting. Encouraged her to contact me if symptoms worsen or if continue after delivery.  2. Dyspnea on exertion I have discussed echocardiogram results with the patient. Normal echo. Again feel that her symptoms are related to her current pregnancy state. Advised her to continue to monitor after delivery.   3. [redacted] weeks gestation of pregnancy Has had uncomplicated pregnancy thus far. Continue follow up with OBGYN.    Plan: As stated above, do not feel that she needs further cardiac  workup. I will see her back on a as needed basis, but encouraged her to contact me after delivery if she continues to have symptoms.   Jeri Lager, MSN, APRN, FNP-C [facility], [place] Office: [phone] Fax: [phone]

## 2018-05-18 DIAGNOSIS — O99810 Abnormal glucose complicating pregnancy: Secondary | ICD-10-CM | POA: Diagnosis not present

## 2018-05-18 DIAGNOSIS — Z3A31 31 weeks gestation of pregnancy: Secondary | ICD-10-CM | POA: Diagnosis not present

## 2018-06-03 ENCOUNTER — Other Ambulatory Visit: Payer: Self-pay

## 2018-06-03 ENCOUNTER — Inpatient Hospital Stay (HOSPITAL_COMMUNITY)
Admission: AD | Admit: 2018-06-03 | Discharge: 2018-06-04 | Disposition: A | Discharge status: Home / Self Care | Payer: BLUE CROSS/BLUE SHIELD | Attending: Obstetrics & Gynecology | Admitting: Obstetrics & Gynecology

## 2018-06-03 ENCOUNTER — Encounter (HOSPITAL_COMMUNITY): Payer: Self-pay

## 2018-06-03 DIAGNOSIS — Z711 Person with feared health complaint in whom no diagnosis is made: Secondary | ICD-10-CM

## 2018-06-03 DIAGNOSIS — O9989 Other specified diseases and conditions complicating pregnancy, childbirth and the puerperium: Secondary | ICD-10-CM | POA: Diagnosis not present

## 2018-06-03 DIAGNOSIS — R0989 Other specified symptoms and signs involving the circulatory and respiratory systems: Secondary | ICD-10-CM | POA: Diagnosis not present

## 2018-06-03 DIAGNOSIS — Z3A33 33 weeks gestation of pregnancy: Secondary | ICD-10-CM | POA: Diagnosis not present

## 2018-06-03 NOTE — MAU Provider Note (Signed)
Chief Complaint:  Hypertension and Foot Swelling   First Provider Initiated Contact with Patient 06/03/18 2358     [place]  HPI: Kathleen Hamilton is a 23 y.o. G2P0010 at 77w4dho presents to maternity admissions reporting an elevated blood pressure reading using a friend's BP cuff (small cuff).  Has had some swelling in feet and slight dizziness at times, none now. . She reports good fetal movement, denies LOF, vaginal bleeding, vaginal itching/burning, urinary symptoms, h/a, dizziness, n/v, diarrhea, constipation or fever/chills.  She denies headache, visual changes or RUQ abdominal pain.  RN Note: Pt here with c/o increased blood pressure, swelling in feet, some dizziness. Denies any bleeding and leaking of fluid. Reports good fetal movement. Took BP at home and it was 150/112.  Past Medical History: Past Medical History:  Diagnosis Date  . Dysmenorrhea   . Endometriosis   . Heart murmur   . [place] [place]     Past obstetric history: OB History  Gravida Para Term Preterm AB Living  2       1 0  SAB TAB Ectopic Multiple Live Births  1            # Outcome Date GA Lbr Len/2nd Weight Sex Delivery Anes PTL Lv  2 Current           1 SAB             Past Surgical History: Past Surgical History:  Procedure Laterality Date  . BIOPSY BOWEL    . COLON BIOPSY     hx of colon problems.  ? mega colon, awaiting bx results  . NO PAST SURGERIES      Family History: Family History  Problem Relation Age of Onset  . Diabetes Maternal Grandmother   . Hypertension Maternal Grandmother   . Heart disease Maternal Grandmother   . Breast cancer Maternal Grandmother        unsure age of onset  . Diabetes Paternal Grandmother   . Heart disease Paternal Grandmother   . Hypertension Paternal Grandmother   . Diabetes Paternal Grandfather   . Heart disease Paternal Grandfather   . Hypertension Paternal Grandfather     Social History: Social History   Tobacco Use  . Smoking  status: Never Smoker  . Smokeless tobacco: Never Used  Substance Use Topics  . Alcohol use: No  . Drug use: No    Allergies:  Allergies  Allergen Reactions  . Aspirin Other (See Comments)    Reaction:  Stomach bleeding   . Fish Allergy   . Naproxen Other (See Comments)    Reaction:  Stomach bleeding   . Nsaids     GI BLEED  . Shellfish Allergy Diarrhea, Nausea And Vomiting, Swelling and Rash    Meds:  Medications Prior to Admission  Medication Sig Dispense Refill Last Dose  . pantoprazole (PROTONIX) 40 MG tablet Take 40 mg by mouth daily.   06/03/2018 at Unknown time  . Prenatal Vit-Fe Fumarate-FA (PRENATAL MULTIVITAMIN) TABS tablet Take 1 tablet by mouth daily at 12 noon.   06/03/2018 at Unknown time  . acetaminophen (TYLENOL) 500 MG tablet Take 1 tablet (500 mg total) by mouth every 6 (six) hours as needed. 30 tablet 0 Taking    I have reviewed patient's Past Medical Hx, Surgical Hx, Family Hx, Social Hx, medications and allergies.   ROS:  Review of Systems  Constitutional: Negative for chills and fever.  Cardiovascular: Positive for leg swelling.  Gastrointestinal: Negative for abdominal pain,  constipation, diarrhea and nausea.  Genitourinary: Negative for vaginal bleeding.  Neurological: Negative for weakness and headaches.   Other systems negative  Physical Exam   Patient Vitals for the past 24 hrs:  BP Temp Temp src Pulse Resp SpO2 Height Weight  06/03/18 2336 131/80 98.6 F (37 C) Oral 79 20 100 % 5' 5"  (1.651 m) 122.5 kg   Vitals:   06/04/18 0000 06/04/18 0015 06/04/18 0030 06/04/18 0045  BP: 129/64 124/74 (!) 114/53 108/61  Pulse: 84 79 74 74  Resp:      Temp:      TempSrc:      SpO2:      Weight:      Height:        Constitutional: Well-developed, well-nourished female in no acute distress.  Cardiovascular: normal rate and rhythm Respiratory: normal effort, clear to auscultation bilaterally GI: Abd soft, non-tender, gravid appropriate for  gestational age.   No rebound or guarding. MS: Extremities nontender, Trace pedal edema, normal ROM Neurologic: Alert and oriented x 4. DTRs normal with no clonus GU: Neg CVAT.  PELVIC EXAM:  deferred  FHT:  Baseline 140 , moderate variability, accelerations present, no decelerations Contractions:  Rare   Labs: Results for orders placed or performed during the hospital encounter of 06/03/18 (from the past 72 hour(s))  Urinalysis, Routine w reflex microscopic     Status: None   Collection Time: 06/03/18 11:56 PM  Result Value Ref Range   Color, Urine YELLOW YELLOW   APPearance CLEAR CLEAR   Specific Gravity, Urine 1.013 1.005 - 1.030   pH 6.0 5.0 - 8.0   Glucose, UA NEGATIVE NEGATIVE mg/dL   Hgb urine dipstick NEGATIVE NEGATIVE   Bilirubin Urine NEGATIVE NEGATIVE   Ketones, ur NEGATIVE NEGATIVE mg/dL   Protein, ur NEGATIVE NEGATIVE mg/dL   Nitrite NEGATIVE NEGATIVE   Leukocytes,Ua NEGATIVE NEGATIVE    Comment: Performed at [facility], [address] [place], [place] [address]  Protein / creatinine ratio, urine     Status: None   Collection Time: 06/03/18 11:56 PM  Result Value Ref Range   Creatinine, Urine 106.69 mg/dL   Total Protein, Urine 10 mg/dL    Comment: NO NORMAL RANGE ESTABLISHED FOR THIS TEST   Protein Creatinine Ratio 0.09 0.00 - 0.15 mg/mg[Cre]    Comment: Performed at [facility], [place] [address], [place], [place] [address]  CBC     Status: Abnormal   Collection Time: 06/04/18 12:09 AM  Result Value Ref Range   WBC 11.9 (H) 4.0 - 10.5 K/uL   RBC 3.58 (L) 3.87 - 5.11 MIL/uL   Hemoglobin 11.4 (L) 12.0 - 15.0 g/dL   HCT 32.4 (L) 36.0 - 46.0 %   MCV 90.5 80.0 - 100.0 fL   MCH 31.8 26.0 - 34.0 pg   MCHC 35.2 30.0 - 36.0 g/dL   RDW 13.3 11.5 - 15.5 %   Platelets 268 150 - 400 K/uL   nRBC 0.0 0.0 - 0.2 %    Comment: Performed at [facility], [place] [address], [place], [place] [address]  Comprehensive metabolic panel     Status: Abnormal    Collection Time: 06/04/18 12:09 AM  Result Value Ref Range   Sodium 135 135 - 145 mmol/L   Potassium 3.7 3.5 - 5.1 mmol/L   Chloride 107 98 - 111 mmol/L   CO2 20 (L) 22 - 32 mmol/L   Glucose, Bld 88 70 - 99 mg/dL  BUN 7 6 - 20 mg/dL   Creatinine, Ser 0.55 0.44 - 1.00 mg/dL   Calcium 9.0 8.9 - 10.3 mg/dL   Total Protein 6.1 (L) 6.5 - 8.1 g/dL   Albumin 2.6 (L) 3.5 - 5.0 g/dL   AST 25 15 - 41 U/L   ALT 23 0 - 44 U/L   Alkaline Phosphatase 97 38 - 126 U/L   Total Bilirubin 0.5 0.3 - 1.2 mg/dL   GFR calc non Af Amer >60 >60 mL/min   GFR calc Af Amer >60 >60 mL/min   Anion gap 8 5 - 15    Comment: Performed at [facility] [address], [place], [place] [address]      Imaging:  No results found.  MAU Course/MDM: I have ordered labs and reviewed results. Preeclampsia labs are normal  Blood pressures have been within normal limits here, suspect BP cuff used was too small.  Advised to seek larger cuff NST reviewed, reactive   Treatments in MAU included EFM.    Assessment: Single intrauterine pregnancy at 52w4dLabile blood pressure readings, normotensive here No evidence of preeclampsia Well but worried  Plan: Discharge home Preeclampsia precautions  Preterm Labor precautions and fetal kick counts Follow up in Office for prenatal visits and recheck of status  Encouraged to return here or to other Urgent Care/ED if she develops worsening of symptoms, increase in pain, fever, or other concerning symptoms.   Pt stable at time of discharge.  MHansel FeinsteinCNM, MSN Certified Nurse-Midwife 06/04/2018 12:00 AM

## 2018-06-03 NOTE — MAU Note (Signed)
Pt here with c/o increased blood pressure, swelling in feet, some dizziness. Denies any bleeding and leaking of fluid. Reports good fetal movement. Took BP at home and it was 150/112.

## 2018-06-04 DIAGNOSIS — Z3A33 33 weeks gestation of pregnancy: Secondary | ICD-10-CM | POA: Diagnosis not present

## 2018-06-04 DIAGNOSIS — Z711 Person with feared health complaint in whom no diagnosis is made: Secondary | ICD-10-CM

## 2018-06-04 DIAGNOSIS — O9989 Other specified diseases and conditions complicating pregnancy, childbirth and the puerperium: Secondary | ICD-10-CM | POA: Diagnosis not present

## 2018-06-04 LAB — PROTEIN / CREATININE RATIO, URINE
Creatinine, Urine: 106.69 mg/dL
Protein Creatinine Ratio: 0.09 mg/mg{Cre} (ref 0.00–0.15)
Total Protein, Urine: 10 mg/dL

## 2018-06-04 LAB — COMPREHENSIVE METABOLIC PANEL
ALT: 23 U/L (ref 0–44)
AST: 25 U/L (ref 15–41)
Albumin: 2.6 g/dL — ABNORMAL LOW (ref 3.5–5.0)
Alkaline Phosphatase: 97 U/L (ref 38–126)
Anion gap: 8 (ref 5–15)
BUN: 7 mg/dL (ref 6–20)
CO2: 20 mmol/L — ABNORMAL LOW (ref 22–32)
Calcium: 9 mg/dL (ref 8.9–10.3)
Chloride: 107 mmol/L (ref 98–111)
Creatinine, Ser: 0.55 mg/dL (ref 0.44–1.00)
GFR calc Af Amer: >60 mL/min (ref >60)
GFR calc non Af Amer: >60 mL/min (ref >60)
Glucose, Bld: 88 mg/dL (ref 70–99)
Potassium: 3.7 mmol/L (ref 3.5–5.1)
Sodium: 135 mmol/L (ref 135–145)
Total Bilirubin: 0.5 mg/dL (ref 0.3–1.2)
Total Protein: 6.1 g/dL — ABNORMAL LOW (ref 6.5–8.1)

## 2018-06-04 LAB — URINALYSIS, ROUTINE W REFLEX MICROSCOPIC
Bilirubin Urine: NEGATIVE
Glucose, UA: NEGATIVE mg/dL
Hgb urine dipstick: NEGATIVE
Ketones, ur: NEGATIVE mg/dL
Leukocytes,Ua: NEGATIVE
Nitrite: NEGATIVE
Protein, ur: NEGATIVE mg/dL
Specific Gravity, Urine: 1.013 (ref 1.005–1.030)
pH: 6 (ref 5.0–8.0)

## 2018-06-04 LAB — CBC
HCT: 32.4 % — ABNORMAL LOW (ref 36.0–46.0)
Hemoglobin: 11.4 g/dL — ABNORMAL LOW (ref 12.0–15.0)
MCH: 31.8 pg (ref 26.0–34.0)
MCHC: 35.2 g/dL (ref 30.0–36.0)
MCV: 90.5 fL (ref 80.0–100.0)
Platelets: 268 10*3/uL (ref 150–400)
RBC: 3.58 MIL/uL — ABNORMAL LOW (ref 3.87–5.11)
RDW: 13.3 % (ref 11.5–15.5)
WBC: 11.9 10*3/uL — ABNORMAL HIGH (ref 4.0–10.5)
nRBC: 0 % (ref 0.0–0.2)

## 2018-06-04 NOTE — Discharge Instructions (Signed)
Third Trimester of Pregnancy The third trimester is from week 28 through week 40 (months 7 through 9). The third trimester is a time when the unborn baby (fetus) is growing rapidly. At the end of the ninth month, the fetus is about 20 inches in length and weighs 6-10 pounds. Body changes during your third trimester Your body will continue to go through many changes during pregnancy. The changes vary from woman to woman. During the third trimester:  Your weight will continue to increase. You can expect to gain 25-35 pounds (11-16 kg) by the end of the pregnancy.  You may begin to get stretch marks on your hips, abdomen, and breasts.  You may urinate more often because the fetus is moving lower into your pelvis and pressing on your bladder.  You may develop or continue to have heartburn. This is caused by increased hormones that slow down muscles in the digestive tract.  You may develop or continue to have constipation because increased hormones slow digestion and cause the muscles that push waste through your intestines to relax.  You may develop hemorrhoids. These are swollen veins (varicose veins) in the rectum that can itch or be painful.  You may develop swollen, bulging veins (varicose veins) in your legs.  You may have increased body aches in the pelvis, back, or thighs. This is due to weight gain and increased hormones that are relaxing your joints.  You may have changes in your hair. These can include thickening of your hair, rapid growth, and changes in texture. Some women also have hair loss during or after pregnancy, or hair that feels dry or thin. Your hair will most likely return to normal after your baby is born.  Your breasts will continue to grow and they will continue to become tender. A yellow fluid (colostrum) may leak from your breasts. This is the first milk you are producing for your baby.  Your belly button may stick out.  You may notice more swelling in your hands,  face, or ankles.  You may have increased tingling or numbness in your hands, arms, and legs. The skin on your belly may also feel numb.  You may feel short of breath because of your expanding uterus.  You may have more problems sleeping. This can be caused by the size of your belly, increased need to urinate, and an increase in your body's metabolism.  You may notice the fetus "dropping," or moving lower in your abdomen (lightening).  You may have increased vaginal discharge.  You may notice your joints feel loose and you may have pain around your pelvic bone. What to expect at prenatal visits You will have prenatal exams every 2 weeks until week 36. Then you will have weekly prenatal exams. During a routine prenatal visit:  You will be weighed to make sure you and the baby are growing normally.  Your blood pressure will be taken.  Your abdomen will be measured to track your baby's growth.  The fetal heartbeat will be listened to.  Any test results from the previous visit will be discussed.  You may have a cervical check near your due date to see if your cervix has softened or thinned (effaced).  You will be tested for Group B streptococcus. This happens between 35 and 37 weeks. Your health care provider may ask you:  What your birth plan is.  How you are feeling.  If you are feeling the baby move.  If you have had any abnormal  symptoms, such as leaking fluid, bleeding, severe headaches, or abdominal cramping.  If you are using any tobacco products, including cigarettes, chewing tobacco, and electronic cigarettes.  If you have any questions. Other tests or screenings that may be performed during your third trimester include:  Blood tests that check for low iron levels (anemia).  Fetal testing to check the health, activity level, and growth of the fetus. Testing is done if you have certain medical conditions or if there are problems during the pregnancy.  Nonstress test  (NST). This test checks the health of your baby to make sure there are no signs of problems, such as the baby not getting enough oxygen. During this test, a belt is placed around your belly. The baby is made to move, and its heart rate is monitored during movement. What is false labor? False labor is a condition in which you feel small, irregular tightenings of the muscles in the womb (contractions) that usually go away with rest, changing position, or drinking water. These are called Braxton Hicks contractions. Contractions may last for hours, days, or even weeks before true labor sets in. If contractions come at regular intervals, become more frequent, increase in intensity, or become painful, you should see your health care provider. What are the signs of labor?  Abdominal cramps.  Regular contractions that start at 10 minutes apart and become stronger and more frequent with time.  Contractions that start on the top of the uterus and spread down to the lower abdomen and back.  Increased pelvic pressure and dull back pain.  A watery or bloody mucus discharge that comes from the vagina.  Leaking of amniotic fluid. This is also known as your "water breaking." It could be a slow trickle or a gush. Let your health care provider know if it has a color or strange odor. If you have any of these signs, call your health care provider right away, even if it is before your due date. Follow these instructions at home: Medicines  Follow your health care provider's instructions regarding medicine use. Specific medicines may be either safe or unsafe to take during pregnancy.  Take a prenatal vitamin that contains at least 600 micrograms (mcg) of folic acid.  If you develop constipation, try taking a stool softener if your health care provider approves. Eating and drinking   Eat a balanced diet that includes fresh fruits and vegetables, whole grains, good sources of protein such as meat, eggs, or tofu,  and low-fat dairy. Your health care provider will help you determine the amount of weight gain that is right for you.  Avoid raw meat and uncooked cheese. These carry germs that can cause birth defects in the baby.  If you have low calcium intake from food, talk to your health care provider about whether you should take a daily calcium supplement.  Eat four or five small meals rather than three large meals a day.  Limit foods that are high in fat and processed sugars, such as fried and sweet foods.  To prevent constipation: ? Drink enough fluid to keep your urine clear or pale yellow. ? Eat foods that are high in fiber, such as fresh fruits and vegetables, whole grains, and beans. Activity  Exercise only as directed by your health care provider. Most women can continue their usual exercise routine during pregnancy. Try to exercise for 30 minutes at least 5 days a week. Stop exercising if you experience uterine contractions.  Avoid heavy lifting.  Do  not exercise in extreme heat or humidity, or at high altitudes.  Wear low-heel, comfortable shoes.  Practice good posture.  You may continue to have sex unless your health care provider tells you otherwise. Relieving pain and discomfort  Take frequent breaks and rest with your legs elevated if you have leg cramps or low back pain.  Take warm sitz baths to soothe any pain or discomfort caused by hemorrhoids. Use hemorrhoid cream if your health care provider approves.  Wear a good support bra to prevent discomfort from breast tenderness.  If you develop varicose veins: ? Wear support pantyhose or compression stockings as told by your healthcare provider. ? Elevate your feet for 15 minutes, 3-4 times a day. Prenatal care  Write down your questions. Take them to your prenatal visits.  Keep all your prenatal visits as told by your health care provider. This is important. Safety  Wear your seat belt at all times when driving.  Make  a list of emergency phone numbers, including numbers for family, friends, the hospital, and police and fire departments. General instructions  Avoid cat litter boxes and soil used by cats. These carry germs that can cause birth defects in the baby. If you have a cat, ask someone to clean the litter box for you.  Do not travel far distances unless it is absolutely necessary and only with the approval of your health care provider.  Do not use hot tubs, steam rooms, or saunas.  Do not drink alcohol.  Do not use any products that contain nicotine or tobacco, such as cigarettes and e-cigarettes. If you need help quitting, ask your health care provider.  Do not use any medicinal herbs or unprescribed drugs. These chemicals affect the formation and growth of the baby.  Do not douche or use tampons or scented sanitary pads.  Do not cross your legs for long periods of time.  To prepare for the arrival of your baby: ? Take prenatal classes to understand, practice, and ask questions about labor and delivery. ? Make a trial run to the hospital. ? Visit the hospital and tour the maternity area. ? Arrange for maternity or paternity leave through employers. ? Arrange for family and friends to take care of pets while you are in the hospital. ? Purchase a rear-facing car seat and make sure you know how to install it in your car. ? Pack your hospital bag. ? Prepare the babys nursery. Make sure to remove all pillows and stuffed animals from the baby's crib to prevent suffocation.  Visit your dentist if you have not gone during your pregnancy. Use a soft toothbrush to brush your teeth and be gentle when you floss. Contact a health care provider if:  You are unsure if you are in labor or if your water has broken.  You become dizzy.  You have mild pelvic cramps, pelvic pressure, or nagging pain in your abdominal area.  You have lower back pain.  You have persistent nausea, vomiting, or  diarrhea.  You have an unusual or bad smelling vaginal discharge.  You have pain when you urinate. Get help right away if:  Your water breaks before 37 weeks.  You have regular contractions less than 5 minutes apart before 37 weeks.  You have a fever.  You are leaking fluid from your vagina.  You have spotting or bleeding from your vagina.  You have severe abdominal pain or cramping.  You have rapid weight loss or weight gain.  You have  shortness of breath with chest pain.  You notice sudden or extreme swelling of your face, hands, ankles, feet, or legs.  Your baby makes fewer than 10 movements in 2 hours.  You have severe headaches that do not go away when you take medicine.  You have vision changes. Summary  The third trimester is from week 28 through week 40, months 7 through 9. The third trimester is a time when the unborn baby (fetus) is growing rapidly.  During the third trimester, your discomfort may increase as you and your baby continue to gain weight. You may have abdominal, leg, and back pain, sleeping problems, and an increased need to urinate.  During the third trimester your breasts will keep growing and they will continue to become tender. A yellow fluid (colostrum) may leak from your breasts. This is the first milk you are producing for your baby.  False labor is a condition in which you feel small, irregular tightenings of the muscles in the womb (contractions) that eventually go away. These are called Braxton Hicks contractions. Contractions may last for hours, days, or even weeks before true labor sets in.  Signs of labor can include: abdominal cramps; regular contractions that start at 10 minutes apart and become stronger and more frequent with time; watery or bloody mucus discharge that comes from the vagina; increased pelvic pressure and dull back pain; and leaking of amniotic fluid. This information is not intended to replace advice given to you by your  health care provider. Make sure you discuss any questions you have with your health care provider. Document Released: 01/25/2001 Document Revised: 03/08/2016 Document Reviewed: 03/08/2016 Elsevier Interactive Patient Education  2019 [employer].

## 2018-06-05 DIAGNOSIS — Z3A33 33 weeks gestation of pregnancy: Secondary | ICD-10-CM | POA: Diagnosis not present

## 2018-06-05 DIAGNOSIS — O99810 Abnormal glucose complicating pregnancy: Secondary | ICD-10-CM | POA: Diagnosis not present

## 2018-06-05 LAB — OB RESULTS CONSOLE GBS: GBS: POSITIVE

## 2018-06-19 DIAGNOSIS — Z118 Encounter for screening for other infectious and parasitic diseases: Secondary | ICD-10-CM | POA: Diagnosis not present

## 2018-06-19 DIAGNOSIS — O365930 Maternal care for other known or suspected poor fetal growth, third trimester, not applicable or unspecified: Secondary | ICD-10-CM | POA: Diagnosis not present

## 2018-06-19 DIAGNOSIS — Z3685 Encounter for antenatal screening for Streptococcus B: Secondary | ICD-10-CM | POA: Diagnosis not present

## 2018-06-19 DIAGNOSIS — Z3A35 35 weeks gestation of pregnancy: Secondary | ICD-10-CM | POA: Diagnosis not present

## 2018-06-19 DIAGNOSIS — Z113 Encounter for screening for infections with a predominantly sexual mode of transmission: Secondary | ICD-10-CM | POA: Diagnosis not present

## 2018-06-20 DIAGNOSIS — O365931 Maternal care for other known or suspected poor fetal growth, third trimester, fetus 1: Secondary | ICD-10-CM | POA: Diagnosis not present

## 2018-06-20 DIAGNOSIS — Z3A35 35 weeks gestation of pregnancy: Secondary | ICD-10-CM | POA: Diagnosis not present

## 2018-06-26 DIAGNOSIS — O365930 Maternal care for other known or suspected poor fetal growth, third trimester, not applicable or unspecified: Secondary | ICD-10-CM | POA: Diagnosis not present

## 2018-06-26 DIAGNOSIS — Z3A36 36 weeks gestation of pregnancy: Secondary | ICD-10-CM | POA: Diagnosis not present

## 2018-07-03 DIAGNOSIS — O365930 Maternal care for other known or suspected poor fetal growth, third trimester, not applicable or unspecified: Secondary | ICD-10-CM | POA: Diagnosis not present

## 2018-07-03 DIAGNOSIS — Z3A37 37 weeks gestation of pregnancy: Secondary | ICD-10-CM | POA: Diagnosis not present

## 2018-07-10 DIAGNOSIS — Z3A38 38 weeks gestation of pregnancy: Secondary | ICD-10-CM | POA: Diagnosis not present

## 2018-07-10 DIAGNOSIS — O365930 Maternal care for other known or suspected poor fetal growth, third trimester, not applicable or unspecified: Secondary | ICD-10-CM | POA: Diagnosis not present

## 2018-07-11 ENCOUNTER — Other Ambulatory Visit: Payer: Self-pay

## 2018-07-11 ENCOUNTER — Inpatient Hospital Stay (HOSPITAL_COMMUNITY)
Admission: AD | Admit: 2018-07-11 | Discharge: 2018-07-13 | DRG: 806 | Disposition: A | Discharge status: Home / Self Care | Payer: BLUE CROSS/BLUE SHIELD | Attending: Obstetrics and Gynecology | Admitting: Obstetrics and Gynecology

## 2018-07-11 ENCOUNTER — Inpatient Hospital Stay (HOSPITAL_COMMUNITY): Payer: BLUE CROSS/BLUE SHIELD | Admitting: Anesthesiology

## 2018-07-11 ENCOUNTER — Encounter (HOSPITAL_COMMUNITY): Payer: Self-pay

## 2018-07-11 DIAGNOSIS — O99214 Obesity complicating childbirth: Secondary | ICD-10-CM | POA: Diagnosis not present

## 2018-07-11 DIAGNOSIS — D649 Anemia, unspecified: Secondary | ICD-10-CM | POA: Diagnosis not present

## 2018-07-11 DIAGNOSIS — D573 Sickle-cell trait: Secondary | ICD-10-CM | POA: Diagnosis not present

## 2018-07-11 DIAGNOSIS — O99824 Streptococcus B carrier state complicating childbirth: Secondary | ICD-10-CM | POA: Diagnosis present

## 2018-07-11 DIAGNOSIS — O1205 Gestational edema, complicating the puerperium: Secondary | ICD-10-CM | POA: Diagnosis not present

## 2018-07-11 DIAGNOSIS — O134 Gestational [pregnancy-induced] hypertension without significant proteinuria, complicating childbirth: Secondary | ICD-10-CM | POA: Diagnosis not present

## 2018-07-11 DIAGNOSIS — Z1159 Encounter for screening for other viral diseases: Secondary | ICD-10-CM | POA: Diagnosis not present

## 2018-07-11 DIAGNOSIS — Z8759 Personal history of other complications of pregnancy, childbirth and the puerperium: Secondary | ICD-10-CM

## 2018-07-11 DIAGNOSIS — Z3A38 38 weeks gestation of pregnancy: Secondary | ICD-10-CM

## 2018-07-11 DIAGNOSIS — O9081 Anemia of the puerperium: Secondary | ICD-10-CM | POA: Diagnosis not present

## 2018-07-11 DIAGNOSIS — O365930 Maternal care for other known or suspected poor fetal growth, third trimester, not applicable or unspecified: Secondary | ICD-10-CM | POA: Diagnosis not present

## 2018-07-11 DIAGNOSIS — O26893 Other specified pregnancy related conditions, third trimester: Secondary | ICD-10-CM | POA: Diagnosis not present

## 2018-07-11 DIAGNOSIS — D62 Acute posthemorrhagic anemia: Secondary | ICD-10-CM | POA: Diagnosis not present

## 2018-07-11 DIAGNOSIS — E669 Obesity, unspecified: Secondary | ICD-10-CM | POA: Diagnosis present

## 2018-07-11 DIAGNOSIS — O43893 Other placental disorders, third trimester: Secondary | ICD-10-CM | POA: Diagnosis not present

## 2018-07-11 DIAGNOSIS — O139 Gestational [pregnancy-induced] hypertension without significant proteinuria, unspecified trimester: Secondary | ICD-10-CM | POA: Diagnosis present

## 2018-07-11 LAB — COMPREHENSIVE METABOLIC PANEL
ALT: 15 U/L (ref 0–44)
AST: 23 U/L (ref 15–41)
Albumin: 2.8 g/dL — ABNORMAL LOW (ref 3.5–5.0)
Alkaline Phosphatase: 166 U/L — ABNORMAL HIGH (ref 38–126)
Anion gap: 10 (ref 5–15)
BUN: 9 mg/dL (ref 6–20)
CO2: 19 mmol/L — ABNORMAL LOW (ref 22–32)
Calcium: 8.8 mg/dL — ABNORMAL LOW (ref 8.9–10.3)
Chloride: 108 mmol/L (ref 98–111)
Creatinine, Ser: 0.64 mg/dL (ref 0.44–1.00)
GFR calc Af Amer: >60 mL/min (ref >60)
GFR calc non Af Amer: >60 mL/min (ref >60)
Glucose, Bld: 89 mg/dL (ref 70–99)
Potassium: 4.1 mmol/L (ref 3.5–5.1)
Sodium: 137 mmol/L (ref 135–145)
Total Bilirubin: 0.7 mg/dL (ref 0.3–1.2)
Total Protein: 6.6 g/dL (ref 6.5–8.1)

## 2018-07-11 LAB — PROTEIN / CREATININE RATIO, URINE
Creatinine, Urine: 148.86 mg/dL
Protein Creatinine Ratio: 0.23 mg/mg{Cre} — ABNORMAL HIGH (ref 0.00–0.15)
Total Protein, Urine: 34 mg/dL

## 2018-07-11 LAB — CBC
HCT: 33.2 % — ABNORMAL LOW (ref 36.0–46.0)
HCT: 34.3 % — ABNORMAL LOW (ref 36.0–46.0)
Hemoglobin: 11.6 g/dL — ABNORMAL LOW (ref 12.0–15.0)
Hemoglobin: 11.9 g/dL — ABNORMAL LOW (ref 12.0–15.0)
MCH: 33 pg (ref 26.0–34.0)
MCH: 32.9 pg (ref 26.0–34.0)
MCHC: 34.9 g/dL (ref 30.0–36.0)
MCHC: 34.7 g/dL (ref 30.0–36.0)
MCV: 94.3 fL (ref 80.0–100.0)
MCV: 94.8 fL (ref 80.0–100.0)
Platelets: 272 10*3/uL (ref 150–400)
Platelets: 306 10*3/uL (ref 150–400)
RBC: 3.52 MIL/uL — ABNORMAL LOW (ref 3.87–5.11)
RBC: 3.62 MIL/uL — ABNORMAL LOW (ref 3.87–5.11)
RDW: 14.4 % (ref 11.5–15.5)
RDW: 14.4 % (ref 11.5–15.5)
WBC: 12.5 10*3/uL — ABNORMAL HIGH (ref 4.0–10.5)
WBC: 20.8 10*3/uL — ABNORMAL HIGH (ref 4.0–10.5)
nRBC: 0 % (ref 0.0–0.2)
nRBC: 0 % (ref 0.0–0.2)

## 2018-07-11 LAB — TYPE AND SCREEN
ABO/RH(D): A POS
Antibody Screen: NEGATIVE

## 2018-07-11 LAB — URIC ACID: Uric Acid, Serum: 8 mg/dL — ABNORMAL HIGH (ref 2.5–7.1)

## 2018-07-11 LAB — SARS CORONAVIRUS 2 BY RT PCR (HOSPITAL ORDER, PERFORMED IN ~~LOC~~ HOSPITAL LAB): SARS Coronavirus 2: NEGATIVE

## 2018-07-11 MED ORDER — LIDOCAINE HCL (PF) 1 % IJ SOLN
INTRAMUSCULAR | Status: DC | PRN
  Administered 2018-07-11: 6 mL via EPIDURAL

## 2018-07-11 MED ORDER — WITCH HAZEL-GLYCERIN EX PADS: 1.0000 "application " | MEDICATED_PAD | CUTANEOUS | Status: DC | PRN

## 2018-07-11 MED ORDER — ACETAMINOPHEN 325 MG PO TABS
650.0000 mg | ORAL_TABLET | ORAL | Status: DC | PRN
  Administered 2018-07-11 – 2018-07-13 (×5): 650 mg via ORAL
  Filled 2018-07-11 (×5): qty 2

## 2018-07-11 MED ORDER — SOD CITRATE-CITRIC ACID 500-334 MG/5ML PO SOLN: 30.0000 mL | ORAL | Status: DC | PRN

## 2018-07-11 MED ORDER — ONDANSETRON HCL 4 MG PO TABS: 4.0000 mg | ORAL_TABLET | ORAL | Status: DC | PRN

## 2018-07-11 MED ORDER — EPHEDRINE 5 MG/ML INJ: 10.0000 mg | INTRAVENOUS | Status: DC | PRN

## 2018-07-11 MED ORDER — PENICILLIN G 3 MILLION UNITS IVPB - SIMPLE MED
3.0000 10*6.[IU] | INTRAVENOUS | Status: DC
  Administered 2018-07-11: 3 10*6.[IU] via INTRAVENOUS
  Filled 2018-07-11: qty 100

## 2018-07-11 MED ORDER — BENZOCAINE-MENTHOL 20-0.5 % EX AERO
1.0000 "application " | INHALATION_SPRAY | CUTANEOUS | Status: DC | PRN
  Filled 2018-07-11: qty 56

## 2018-07-11 MED ORDER — OXYCODONE HCL 5 MG PO TABS: 5.0000 mg | ORAL_TABLET | ORAL | Status: DC | PRN

## 2018-07-11 MED ORDER — LACTATED RINGERS IV SOLN: 500.0000 mL | INTRAVENOUS | Status: DC | PRN

## 2018-07-11 MED ORDER — OXYTOCIN 40 UNITS IN NORMAL SALINE INFUSION - SIMPLE MED: 2.5000 [IU]/h | INTRAVENOUS | Status: DC

## 2018-07-11 MED ORDER — ONDANSETRON HCL 4 MG/2ML IJ SOLN: 4.0000 mg | INTRAMUSCULAR | Status: DC | PRN

## 2018-07-11 MED ORDER — OXYCODONE HCL 5 MG PO TABS: 10.0000 mg | ORAL_TABLET | ORAL | Status: DC | PRN

## 2018-07-11 MED ORDER — DIPHENHYDRAMINE HCL 50 MG/ML IJ SOLN: 12.5000 mg | INTRAMUSCULAR | Status: DC | PRN

## 2018-07-11 MED ORDER — LACTATED RINGERS AMNIOINFUSION
INTRAVENOUS | Status: DC
  Administered 2018-07-11: 300 mL via INTRAUTERINE

## 2018-07-11 MED ORDER — PHENYLEPHRINE 40 MCG/ML (10ML) SYRINGE FOR IV PUSH (FOR BLOOD PRESSURE SUPPORT): 80.0000 ug | PREFILLED_SYRINGE | INTRAVENOUS | Status: DC | PRN

## 2018-07-11 MED ORDER — TERBUTALINE SULFATE 1 MG/ML IJ SOLN: 0.2500 mg | Freq: Once | INTRAMUSCULAR | Status: DC | PRN

## 2018-07-11 MED ORDER — OXYCODONE-ACETAMINOPHEN 5-325 MG PO TABS: 2.0000 | ORAL_TABLET | ORAL | Status: DC | PRN

## 2018-07-11 MED ORDER — FENTANYL-BUPIVACAINE-NACL 0.5-0.125-0.9 MG/250ML-% EP SOLN
EPIDURAL | Status: AC
  Filled 2018-07-11: qty 250

## 2018-07-11 MED ORDER — SODIUM CHLORIDE 0.9 % IV SOLN
5.0000 10*6.[IU] | Freq: Once | INTRAVENOUS | Status: AC
  Administered 2018-07-11: 5 10*6.[IU] via INTRAVENOUS
  Filled 2018-07-11: qty 5

## 2018-07-11 MED ORDER — LACTATED RINGERS IV SOLN
INTRAVENOUS | Status: DC
  Administered 2018-07-11 (×2): via INTRAVENOUS

## 2018-07-11 MED ORDER — PRENATAL MULTIVITAMIN CH
1.0000 | ORAL_TABLET | Freq: Every day | ORAL | Status: DC
  Administered 2018-07-12 – 2018-07-13 (×2): 1 via ORAL
  Filled 2018-07-11 (×2): qty 1

## 2018-07-11 MED ORDER — LIDOCAINE HCL (PF) 1 % IJ SOLN
30.0000 mL | INTRAMUSCULAR | Status: DC | PRN
  Administered 2018-07-11: 30 mL via SUBCUTANEOUS
  Filled 2018-07-11: qty 30

## 2018-07-11 MED ORDER — SENNOSIDES-DOCUSATE SODIUM 8.6-50 MG PO TABS
2.0000 | ORAL_TABLET | ORAL | Status: DC
  Administered 2018-07-11 – 2018-07-12 (×2): 2 via ORAL
  Filled 2018-07-11 (×2): qty 2

## 2018-07-11 MED ORDER — FENTANYL-BUPIVACAINE-NACL 0.5-0.125-0.9 MG/250ML-% EP SOLN: 12.0000 mL/h | EPIDURAL | Status: DC | PRN

## 2018-07-11 MED ORDER — ZOLPIDEM TARTRATE 5 MG PO TABS: 5.0000 mg | ORAL_TABLET | Freq: Every evening | ORAL | Status: DC | PRN

## 2018-07-11 MED ORDER — DIPHENHYDRAMINE HCL 25 MG PO CAPS: 25.0000 mg | ORAL_CAPSULE | Freq: Four times a day (QID) | ORAL | Status: DC | PRN

## 2018-07-11 MED ORDER — ACETAMINOPHEN 325 MG PO TABS: 650.0000 mg | ORAL_TABLET | ORAL | Status: DC | PRN

## 2018-07-11 MED ORDER — DIBUCAINE (PERIANAL) 1 % EX OINT: 1.0000 "application " | TOPICAL_OINTMENT | CUTANEOUS | Status: DC | PRN

## 2018-07-11 MED ORDER — COCONUT OIL OIL
1.0000 "application " | TOPICAL_OIL | Status: DC | PRN
  Administered 2018-07-12: 1 via TOPICAL

## 2018-07-11 MED ORDER — SODIUM CHLORIDE (PF) 0.9 % IJ SOLN
INTRAMUSCULAR | Status: DC | PRN
  Administered 2018-07-11: 12 mL/h via EPIDURAL

## 2018-07-11 MED ORDER — FERROUS SULFATE 325 (65 FE) MG PO TABS
325.0000 mg | ORAL_TABLET | Freq: Two times a day (BID) | ORAL | Status: DC
  Administered 2018-07-12: 325 mg via ORAL
  Filled 2018-07-11: qty 1

## 2018-07-11 MED ORDER — LACTATED RINGERS IV SOLN
500.0000 mL | Freq: Once | INTRAVENOUS | Status: AC
  Administered 2018-07-11: 500 mL via INTRAVENOUS

## 2018-07-11 MED ORDER — ONDANSETRON HCL 4 MG/2ML IJ SOLN
4.0000 mg | Freq: Four times a day (QID) | INTRAMUSCULAR | Status: DC | PRN
  Administered 2018-07-11: 4 mg via INTRAVENOUS
  Filled 2018-07-11: qty 2

## 2018-07-11 MED ORDER — OXYCODONE-ACETAMINOPHEN 5-325 MG PO TABS: 1.0000 | ORAL_TABLET | ORAL | Status: DC | PRN

## 2018-07-11 MED ORDER — SIMETHICONE 80 MG PO CHEW: 80.0000 mg | CHEWABLE_TABLET | ORAL | Status: DC | PRN

## 2018-07-11 MED ORDER — OXYTOCIN BOLUS FROM INFUSION
500.0000 mL | Freq: Once | INTRAVENOUS | Status: AC
  Administered 2018-07-11: 500 mL via INTRAVENOUS

## 2018-07-11 MED ORDER — OXYTOCIN 40 UNITS IN NORMAL SALINE INFUSION - SIMPLE MED
1.0000 m[IU]/min | INTRAVENOUS | Status: DC
  Administered 2018-07-11: 2 m[IU]/min via INTRAVENOUS
  Filled 2018-07-11: qty 1000

## 2018-07-11 MED ORDER — FENTANYL CITRATE (PF) 100 MCG/2ML IJ SOLN
100.0000 ug | INTRAMUSCULAR | Status: DC | PRN
  Administered 2018-07-11: 100 ug via INTRAVENOUS
  Filled 2018-07-11: qty 2

## 2018-07-11 NOTE — Anesthesia Postprocedure Evaluation (Signed)
Anesthesia Post Note  Patient: Kathleen Hamilton  Procedure(s) Performed: AN AD HOC LABOR EPIDURAL     Patient location during evaluation: Mother Baby Anesthesia Type: Epidural Level of consciousness: awake and alert Pain management: pain level controlled Vital Signs Assessment: post-procedure vital signs reviewed and stable Respiratory status: spontaneous breathing, nonlabored ventilation and respiratory function stable Cardiovascular status: stable Postop Assessment: no headache, no backache and epidural receding Anesthetic complications: no    Last Vitals:  Vitals:   07/11/18 1931 07/11/18 1946  BP: 130/76 128/76  Pulse: 85 78  Resp: 18   Temp:    SpO2:      Last Pain:  Vitals:   07/11/18 1935  TempSrc:   PainSc: 0-No pain                 Tavita Eastham

## 2018-07-11 NOTE — H&P (Signed)
Kathleen Hamilton is a 23 y.o. female presenting @ 17 6/7 week with early labor. (+) GBS intact membrane. BP 138/90 in office today. Denies h/a, visual changes or epigastric pain. (+) leg swelling. PNC complicated by dx of SGA ( 5/5) : 4lb 15 ( 13%) AC (6%). Pt has had antepartum fetal surveillance including NST, BPP/AFI/Dopplers. Last study yesterday. BPP 8/8, low nl AFI( 6.4), nl dopplers  OB History    Gravida  2   Para      Term      Preterm      AB  1   Living  0     SAB  1   TAB      Ectopic      Multiple      Live Births             Past Medical History:  Diagnosis Date  . Dysmenorrhea   . Endometriosis   . Heart murmur   . Ulcerative colitis [facility])    Past Surgical History:  Procedure Laterality Date  . BIOPSY BOWEL    . COLON BIOPSY     hx of colon problems.  ? mega colon, awaiting bx results  . NO PAST SURGERIES     Family History: family history includes Breast cancer in her maternal grandmother; Diabetes in her maternal grandmother, paternal grandfather, and paternal grandmother; Heart disease in her maternal grandmother, paternal grandfather, and paternal grandmother; Hypertension in her maternal grandmother, paternal grandfather, and paternal grandmother. Social History:  reports that she has never smoked. She has never used smokeless tobacco. She reports that she does not drink alcohol or use drugs.     Maternal Diabetes: No Genetic Screening: Normal Maternal Ultrasounds/Referrals: Abnormal:  Findings:   Other:SGA Fetal Ultrasounds or other Referrals:  None Maternal Substance Abuse:  No Significant Maternal Medications:  None Significant Maternal Lab Results:  Lab values include: Group B Strep positive Other Comments:  [place] trait( FOB neg).  Review of Systems  Eyes: Negative for blurred vision and double vision.  Cardiovascular: Positive for leg swelling.  Gastrointestinal: Negative for heartburn.  Musculoskeletal: Positive for back pain.    History Dilation: 1 Effacement (%): 100 Station: -1 Exam by:: S Moyer RN Blood pressure (!) 154/95, pulse 80, temperature 98.4 F (36.9 C), temperature source Oral, resp. rate 18, height 5' 5"  (1.651 m), weight 125.6 kg, last menstrual period 10/12/2017. Exam Physical Exam  Constitutional: She is oriented to person, place, and time. She appears well-developed and well-nourished.  HENT:  Head: Atraumatic.  Neck: Neck supple.  Cardiovascular: Regular rhythm.  GI: Soft.  Musculoskeletal:        General: Edema present.  Neurological: She is alert and oriented to person, place, and time. She has normal reflexes.  Skin: Skin is warm and dry.  Psychiatric: She has a normal mood and affect.    Prenatal labs: ABO, Rh: --/--/A POS (05/27 1100) Antibody: NEG (05/27 1100) Rubella:  Immune RPR:   NR HBsAg:   neg HIV:   neg GBS: Positive (04/21 0000)   Assessment/Plan: Early/latent phase labor  GBS cx (+) [place] trait( FOB neg)  IUP @ 38 6/[redacted] week Gestational HTN P)admit. IV PCN. [place] labs. Pitocin augmentation if spont labor. IV pain med. Epural Prn. Defer  amniotomy   Kathleen Hamilton 07/11/2018, 1:21 PM

## 2018-07-11 NOTE — Anesthesia Preprocedure Evaluation (Signed)
Anesthesia Evaluation  Patient identified by MRN, date of birth, ID band Patient awake    Reviewed: Allergy & Precautions, H&P , NPO status , Patient's Chart, lab work & pertinent test results, reviewed documented beta blocker date and time   Airway Mallampati: II  TM Distance: >3 FB Neck ROM: Full    Dental no notable dental hx.    Pulmonary neg pulmonary ROS,    Pulmonary exam normal breath sounds clear to auscultation       Cardiovascular negative cardio ROS Normal cardiovascular exam Rhythm:Regular Rate:Normal     Neuro/Psych negative neurological ROS  negative psych ROS   GI/Hepatic negative GI ROS, Neg liver ROS,   Endo/Other  negative endocrine ROS  Renal/GU negative Renal ROS  negative genitourinary   Musculoskeletal negative musculoskeletal ROS (+)   Abdominal   Peds negative pediatric ROS (+)  Hematology negative hematology ROS (+)   Anesthesia Other Findings   Reproductive/Obstetrics negative OB ROS                             Anesthesia Physical Anesthesia Plan  ASA: II  Anesthesia Plan: Epidural   Post-op Pain Management:    Induction:   PONV Risk Score and Plan:   Airway Management Planned:   Additional Equipment:   Intra-op Plan:   Post-operative Plan:   Informed Consent: I have reviewed the patients History and Physical, chart, labs and discussed the procedure including the risks, benefits and alternatives for the proposed anesthesia with the patient or authorized representative who has indicated his/her understanding and acceptance.       Plan Discussed with:   Anesthesia Plan Comments:         Anesthesia Quick Evaluation

## 2018-07-11 NOTE — Anesthesia Procedure Notes (Signed)
Epidural Patient location during procedure: OB Start time: 07/11/2018 3:42 PM End time: 07/11/2018 3:45 PM  Staffing Anesthesiologist: Janeece Riggers, MD  Preanesthetic Checklist Completed: patient identified, site marked, surgical consent, pre-op evaluation, timeout performed, IV checked, risks and benefits discussed and monitors and equipment checked  Epidural Patient position: sitting Prep: site prepped and draped and DuraPrep Patient monitoring: continuous pulse ox and blood pressure Approach: midline Location: L3-L4 Injection technique: LOR air  Needle:  Needle type: Tuohy  Needle gauge: 17 G Needle length: 9 cm and 9 Needle insertion depth: 5 cm cm Catheter type: closed end flexible Catheter size: 19 Gauge Catheter at skin depth: 10 cm Test dose: negative  Assessment Events: blood not aspirated, injection not painful, no injection resistance, negative IV test and no paresthesia

## 2018-07-11 NOTE — Progress Notes (Signed)
S: sitting on ball Notes back pain  O: BP (!) 154/95   Pulse 80   Temp 98.4 F (36.9 C) (Oral)   Resp 18   Ht 5' 5"  (1.651 m)   Wt 125.6 kg   LMP 10/12/2017   BMI 46.06 kg/m  Patient Vitals for the past 24 hrs:  BP Temp Temp src Pulse Resp Height Weight  07/11/18 1235 (!) 154/95 - - 80 18 - -  07/11/18 1129 (!) 149/99 - - 98 18 - -  07/11/18 1040 - - - - - 5' 5"  (1.651 m) 125.6 kg  07/11/18 1032 (!) 147/98 98.4 F (36.9 C) Oral 92 18 - -  VE per RN 1/100/-1 Tracing: baseline 150 (+) small accels ? Ctx frequency  CBC Latest Ref Rng & Units 07/11/2018 06/04/2018 03/13/2018  WBC 4.0 - 10.5 K/uL 12.5(H) 11.9(H) 14.1(H)  Hemoglobin 12.0 - 15.0 g/dL 11.6(L) 11.4(L) 13.1  Hematocrit 36.0 - 46.0 % 33.2(L) 32.4(L) 38.5  Platelets 150 - 400 K/uL 272 268 405(H)   CMP Latest Ref Rng & Units 07/11/2018 06/04/2018 03/13/2018  Glucose 70 - 99 mg/dL 89 88 98  BUN 6 - 20 mg/dL 9 7 5(L)  Creatinine 0.44 - 1.00 mg/dL 0.64 0.55 0.35(L)  Sodium 135 - 145 mmol/L 137 135 137  Potassium 3.5 - 5.1 mmol/L 4.1 3.7 3.6  Chloride 98 - 111 mmol/L 108 107 108  CO2 22 - 32 mmol/L 19(L) 20(L) 20(L)  Calcium 8.9 - 10.3 mg/dL 8.8(L) 9.0 9.3  Total Protein 6.5 - 8.1 g/dL 6.6 6.1(L) -  Total Bilirubin 0.3 - 1.2 mg/dL 0.7 0.5 -  Alkaline Phos 38 - 126 U/L 166(H) 97 -  AST 15 - 41 U/L 23 25 -  ALT 0 - 44 U/L 15 23 -  ImP: gestational HTN  GBS cx (+)on IV PCN IUP @ 38 6/7 week SGA P) cont with Pitocin.  Cont with IV PCN. Defer amniotomy

## 2018-07-11 NOTE — Progress Notes (Signed)
Kathleen Hamilton is a 23 y.o. G2P0010 at 2w6dby LMP/ultrasound admitted for IOL due to gestational HTN in early labor  Subjective: No chief complaint on file.  SROM @3 :20 pm clear fluid Epidural Objective BP (!) 141/76   Pulse 98   Temp 98.2 F (36.8 C) (Oral)   Resp 16   Ht 5' 5"  (1.651 m)   Wt 125.6 kg   LMP 10/12/2017   SpO2 100%   BMI 46.06 kg/m  No intake/output data recorded. Total I/O In: 16606[P.O.:250; I.V.:787; IV Piggyback:350] Out: -    UC:   Ctx q 2 mins SVE:   C/c/+1+2 IUPC/ISE placed Tracing: baseline 140 (+) variable decels  Labs: Lab Results  Component Value Date   WBC 12.5 (H) 07/11/2018   HGB 11.6 (L) 07/11/2018   HCT 33.2 (L) 07/11/2018   MCV 94.3 07/11/2018   PLT 272 07/11/2018    Assessment / Plan:  Variable deceleration due to cord compression SGA GBS cx (+) on IV PCN  gestational HTN Term gestation P) amnioinfusion bolus then start pushing. Pedi for delivery   Anticipated MOD:  SVD  Kathleen Hamilton A Kathleen Hamilton 07/11/2018, 5:59 PM

## 2018-07-12 LAB — ABO/RH: ABO/RH(D): A POS

## 2018-07-12 LAB — CBC
HCT: 30.7 % — ABNORMAL LOW (ref 36.0–46.0)
Hemoglobin: 10.7 g/dL — ABNORMAL LOW (ref 12.0–15.0)
MCH: 33 pg (ref 26.0–34.0)
MCHC: 34.9 g/dL (ref 30.0–36.0)
MCV: 94.8 fL (ref 80.0–100.0)
Platelets: 254 10*3/uL (ref 150–400)
RBC: 3.24 MIL/uL — ABNORMAL LOW (ref 3.87–5.11)
RDW: 14.4 % (ref 11.5–15.5)
WBC: 15.7 10*3/uL — ABNORMAL HIGH (ref 4.0–10.5)
nRBC: 0 % (ref 0.0–0.2)

## 2018-07-12 LAB — RPR: RPR Ser Ql: NONREACTIVE

## 2018-07-12 MED ORDER — MAGNESIUM OXIDE 400 (241.3 MG) MG PO TABS
400.0000 mg | ORAL_TABLET | Freq: Two times a day (BID) | ORAL | Status: DC
  Administered 2018-07-12 – 2018-07-13 (×3): 400 mg via ORAL
  Filled 2018-07-12 (×3): qty 1

## 2018-07-12 MED ORDER — POLYSACCHARIDE IRON COMPLEX 150 MG PO CAPS
150.0000 mg | ORAL_CAPSULE | Freq: Every day | ORAL | Status: DC
  Administered 2018-07-13: 150 mg via ORAL
  Filled 2018-07-12: qty 1

## 2018-07-12 NOTE — Anesthesia Postprocedure Evaluation (Signed)
Anesthesia Post Note  Patient: [employer]  Procedure(s) Performed: AN AD HOC LABOR EPIDURAL     Patient location during evaluation: Mother Baby Anesthesia Type: Epidural Level of consciousness: awake and alert Pain management: pain level controlled Vital Signs Assessment: post-procedure vital signs reviewed and stable Respiratory status: spontaneous breathing, nonlabored ventilation and respiratory function stable Cardiovascular status: stable Postop Assessment: no headache, no backache and epidural receding Anesthetic complications: no    Last Vitals:  Vitals:   07/12/18 0325 07/12/18 0631  BP: (!) 135/98 138/85  Pulse: 77 81  Resp: 18 18  Temp: 37 C 36.6 C  SpO2:      Last Pain:  Vitals:   07/12/18 0631  TempSrc: Oral  PainSc: 0-No pain   Pain Goal: Patients Stated Pain Goal: 9 (07/11/18 1530)                 Gilmer Mor

## 2018-07-12 NOTE — Addendum Note (Signed)
Addendum  created 07/12/18 0752 by Alvy Bimler, CRNA   Clinical Note Signed

## 2018-07-12 NOTE — Progress Notes (Signed)
Post Partum Day 1 S/P induced vaginal  Feeding: unknown Subjective: No HA, SOB, CP, F/C, breast symptoms. Normal vaginal bleeding, no clots. Pain controlled.  ambulating without symptoms.  voiding without difficulty    Objective: BP 129/80 (BP Location: Right Arm)   Pulse 76   Temp 98.4 F (36.9 C) (Oral)   Resp 16   Ht 5' 5"  (1.651 m)   Wt 125.6 kg   LMP 10/12/2017   SpO2 100%   Breastfeeding Unknown   BMI 46.06 kg/m  I&O reviewed.   Physical Exam:  Cardiovascular: Regular rate and rhythm Pulmonary: Clear to auscultation bilaterally General: alert and cooperative Lochia: appropriate Uterine Fundus: firm DVT Evaluation: No evidence of DVT seen on physical exam. Ext: No c/c/e Recent Labs    07/11/18 2027 07/12/18 0539  HGB 11.9* 10.7*  HCT 34.3* 30.7*      Assessment/Plan: 23 y.o.  PPD #1 .  normal postpartum exam Continue current postpartum care Ambulate Gestational hypertension.  Late onset.  We will continue to watch blood pressures in the postpartum.  Consider meds, consider early postpartum follow-up Anemia.  Start iron   LOS: 1 day   Ala Dach 07/12/2018 9:44 AM

## 2018-07-12 NOTE — Lactation Note (Signed)
This note was copied from a baby's chart. Lactation Consultation Note  Patient Name: Kathleen Hamilton [id number] Date: 07/12/2018 Reason for consult: Follow-up assessment;Nipple pain/trauma;Infant < 6lbs Mom called out for feeding assist.  Large breasts supported with a rolled cloth under breast.  Nipple everted.  Baby opens well but mouth is small.[name] latches with good depth but mom is very uncomfortable.  She states pain is 7/10 and unable to relax.  Baby taken off breast.  Nipple rounded.  20 mm nipple shield applied.  Baby latched well and mom is more comfortable. Pain is 5/10 but mom relaxed and getting sleepy.  Observed feeding for 10 minutes and baby still feeding when I left room.  Encouraged to call for assist prn.  Maternal Data    Feeding Feeding Type: Breast Fed  LATCH Score Latch: Grasps breast easily, tongue down, lips flanged, rhythmical sucking.  Audible Swallowing: A few with stimulation  Type of Nipple: Everted at rest and after stimulation  Comfort (Breast/Nipple): Filling, red/small blisters or bruises, mild/mod discomfort  Hold (Positioning): Assistance needed to correctly position infant at breast and maintain latch.  LATCH Score: 7  Interventions Interventions: Assisted with latch;Breast compression;Skin to skin;Adjust position;Breast massage;Support pillows  Lactation Tools Discussed/Used Tools: Nipple Shields Nipple shield size: 20   Consult Status Consult Status: Follow-up Date: 07/13/18 Follow-up type: In-patient    [name] 07/12/2018, 1:21 PM

## 2018-07-12 NOTE — Lactation Note (Signed)
This note was copied from a baby's chart. Lactation Consultation Note  Patient Name: Kathleen Hamilton Date: 07/12/2018 Reason for consult: Follow-up assessment;Early term 37-38.6wks;Primapara;Infant < 6lbs Baby is 17 hours old/2% weight loss.  Mom is putting baby to breast with cues but states she is only latching to nipple and feeding is painful.  She is offering baby neosure after breastfeeding but states baby does not get a good seal and milk dribbles out of mouth.  Reminded to pump every 3 hours for breast stimulation.  Instructed to call for [facility] assist with next feeding.  Maternal Data    Feeding Feeding Type: Breast Fed  LATCH Score Latch: Repeated attempts needed to sustain latch, nipple held in mouth throughout feeding, stimulation needed to elicit sucking reflex.  Audible Swallowing: A few with stimulation  Type of Nipple: Everted at rest and after stimulation  Comfort (Breast/Nipple): Filling, red/small blisters or bruises, mild/mod discomfort  Hold (Positioning): Assistance needed to correctly position infant at breast and maintain latch.  LATCH Score: 6  Interventions    Lactation Tools Discussed/Used     Consult Status Consult Status: Follow-up Date: 07/13/18 Follow-up type: In-patient    [name] 07/12/2018, 11:32 AM

## 2018-07-12 NOTE — Lactation Note (Signed)
This note was copied from a baby's chart. Lactation Consultation Note Baby 56 hrs old. Mom states baby is BF well to Rt. Nipple. Mom has harder time latching to Lt. Breast. Mom has large breast w/semi flat nipples. Very short shaft. Compressible, needs stimulation to soften tissue are nipple. Has some edema noted. Reverse pressure demonstrated. Hand expression taught w/mom demonstration. Massaged w/glisten of colostrum. Mom stated she has been leaking since 6 months of pregnancy.   Gave mom information sheet about supplementing babies less than 6lbs. According to hours of age. Encouraged not to feed longer than 30 min. Similac Neosure 22 cal. Given.  W/much stimulation and suck training baby took 8 ml formula after BF. W/gloved finger assessed baby's suck prior to bottle feeding. Baby clamps, has irregular suck/swallow coordination. Taught Pace feeding. Mom states understanding.  Mom shown how to use DEBP & how to disassemble, clean, & reassemble parts. Mom knows to pump q3h for 15-20 min. Mom encouraged to waken baby for feeds. If hasn't cued in 3 hrs. Mom encouraged to feed baby 8-12 times/24 hours and with feeding cues.   Newborn feeding habits of babies less than 6 lb, STS, I&O, breast massage, supply and demand discussed. Encouraged to call for questions or assistance. Lactation brochure given.   Patient Name: Kathleen Hamilton [id number] Date: 07/12/2018 Reason for consult: Initial assessment;Infant < 6lbs;Early term 37-38.6wks;1st time breastfeeding   Maternal Data Has patient been taught Hand Expression?: Yes Does the patient have breastfeeding experience prior to this delivery?: No  Feeding Feeding Type: Breast Fed  LATCH Score Latch: Repeated attempts needed to sustain latch, nipple held in mouth throughout feeding, stimulation needed to elicit sucking reflex.  Audible Swallowing: None  Type of Nipple: Everted at rest and after stimulation(semi flat/short  shaft)  Comfort (Breast/Nipple): Soft / non-tender  Hold (Positioning): No assistance needed to correctly position infant at breast.  LATCH Score: 7  Interventions Interventions: Breast feeding basics reviewed;Breast compression;Skin to skin;Support pillows;DEBP;Breast massage;Position options;Hand express;Pre-pump if needed;Reverse pressure  Lactation Tools Discussed/Used Tools: Pump Breast pump type: Double-Electric Breast Pump WIC Program: Yes Pump Review: Setup, frequency, and cleaning;Milk Storage Initiated by:: Allayne Stack RN IBCLC Date initiated:: 07/12/18   Consult Status Consult Status: Follow-up Date: 07/12/18 Follow-up type: In-patient    Kathleen Hamilton, Elta Guadeloupe 07/12/2018, 3:07 AM

## 2018-07-13 MED ORDER — ACETAMINOPHEN 325 MG PO TABS: 650.0000 mg | ORAL_TABLET | ORAL | 0 refills | Status: DC | PRN

## 2018-07-13 MED ORDER — POLYSACCHARIDE IRON COMPLEX 150 MG PO CAPS: 150.0000 mg | ORAL_CAPSULE | Freq: Every day | ORAL | 3 refills | Status: DC

## 2018-07-13 MED ORDER — HYDROCHLOROTHIAZIDE 25 MG PO TABS
25.0000 mg | ORAL_TABLET | Freq: Every day | ORAL | Status: DC
  Administered 2018-07-13: 25 mg via ORAL
  Filled 2018-07-13: qty 1

## 2018-07-13 MED ORDER — HYDROCHLOROTHIAZIDE 25 MG PO TABS: 25.0000 mg | ORAL_TABLET | Freq: Every day | ORAL | 0 refills | Status: DC

## 2018-07-13 NOTE — Lactation Note (Signed)
This note was copied from a baby's chart. Lactation Consultation Note  Patient Name: Kathleen Hamilton [id number] Date: 07/13/2018    Parents were shown how to assemble & use hand pump (single- & double-mode) that was included in pump kit. If Mom needs to use our DEBP while she's still here, the size 27 flanges would be more appropriate for her. The insert for the Evenflo flanges were discussed. Mom will likely benefit from the insert that gives her a size 28 fitting.   Hand-out provided on virtual support groups & Mom has a phone # to reach Lactation post-discharge. Mom was provided an extra nipple shield size 24 for when she is ready to return infant to breast.   Sanitizing of pump parts & nipple shield discussed.   [name] [facility] 07/13/2018, 11:47 AM

## 2018-07-13 NOTE — Lactation Note (Signed)
This note was copied from a baby's chart. Lactation Consultation Note  Patient Name: Kathleen Hamilton [id number] Date: 07/13/2018   Mom has mild compression stripes on both nipples. She has been using a size 20 nipple shield, but it appears that she needs a bigger size. Mom reports that the nipple shield is too tight on her nipple. Intraoral exam suggests that infant can accommodate the size 24 nipple shield.   A size 24 nipple shield was applied & infant went to breast. Infant was able to accommodate the nipple after some enticing, but Mom could not stand the discomfort. Mom states that all feedings have hurt & she has begun to dread feedings.  Mom is going to pump & BO for a day to give her nipples a break. Mom was instructed to pump whenever infant receives formula (about 8 times/day). Mom has an Even-Flo pump at home.   Parents had commented that they sometimes saw formula come out of the sides of the infant's mouth when she was bottle-feeding. The Similac slow-flow nipple was used, instead.  [name] [facility] 07/13/2018, 11:17 AM

## 2018-07-13 NOTE — Progress Notes (Signed)
PPD #2, VAVD, 2nd degree perineal, right vaginal sulcus, baby girl "Haven"  S:  Reports feeling tired, but well; hasn't slept much since delivery; ready to be discharged home today  Denies HA, visual changes, RUQ/epigastric pain              Tolerating po/ No nausea or vomiting / Denies dizziness or SOB             Bleeding is moderate             Pain controlled with Tylenol             Up ad lib / ambulatory / voiding QS  Newborn breast feeding - going okay; supplementing with formula due to SGA; reports baby has a small mouth and mom with large nipples, so having some difficulty latching; using nipple shield and plans to pump at home   O:               VS: BP (!) 148/85 (BP Location: Left Arm)   Pulse 88   Temp 98.1 F (36.7 C) (Axillary)   Resp 18   Ht 5' 5"  (1.651 m)   Wt 125.6 kg   LMP 10/12/2017   SpO2 98%   Breastfeeding Unknown   BMI 46.06 kg/m    07/13/18 0622  98.1 F (36.7 C)  88  -  18  148/85Abnormal   Semi-fowlers  98 %  -  - EL   07/12/18 2100  98.2 F (36.8 C)  85  -  18  131/87  -  -  -  - JL   07/12/18 1416  98.2 F (36.8 C)  89  -  16  132/84  Semi-fowlers  -  -  - MH   07/12/18 0822  98.4 F (36.9 C)  76  -  16  129/80  Semi-fowlers  -  -  - FJ   07/12/18 0631  97.9 F (36.6 C)  81  -  18  138/85  Semi-fowlers  -  -  - GZ   07/12/18 0325  98.6 F (37 C)  77  -  18  135/98Abnormal   Semi-fowlers  -  -  - GZ      LABS:              Recent Labs    07/11/18 2027 07/12/18 0539  WBC 20.8* 15.7*  HGB 11.9* 10.7*  PLT 306 254               Blood type: --/--/A POS, A POS (05/27 1100)  Rubella: Immune (11/15 0000)                     I&O: Intake/Output      05/28 0701 - 05/29 0700 05/29 0701 - 05/30 0700   P.O.     I.V. (mL/kg)     IV Piggyback     Total Intake(mL/kg)     Urine (mL/kg/hr)     Blood     Total Output     Net                        Physical Exam:             Alert and oriented X3  Lungs: Clear and unlabored  Heart: regular  rate and rhythm / no murmurs  Abdomen: soft, non-tender, non-distended  Fundus: firm, non-tender, U-2  Perineum: well approximated 2nd degree and right vaginal sulcus; mild edema, no erythema or ecchymosis  Lochia: small amount on pad  Extremities: trace LE edema, +1 pitting edema in feet; no calf pain or tenderness    A/P: PPD # 2, VAVD   2nd degree and right vaginal sulcus repair   Gestational Hypertension    - 2 mild range BPs, otherwise asymptomatic, no evidence of PEC   - Edema improving    - Begin HCTZ 49m PO daily x 5 days    - PEC warning s/s reviewed   ABL Anemia compounding chronic IDA   - stable on oral FE  Doing well - stable status  Routine post partum orders  Discharge home today  [facility] discharge book, instructions and warning s/s reviewed   Needs BP check at [facility] in 1 week; then 6 week PP visit   Consult with Dr. CGarwin Brothersfor plan of care   MLars Pinks MSN, [facility]

## 2018-07-13 NOTE — Discharge Summary (Signed)
Obstetric Discharge Summary   Patient Name: Kathleen Hamilton DOB: Apr 19, 1995 MRN: [id number]  Date of Admission: 07/11/2018 Date of Discharge: 07/13/2018 Date of Delivery: 07/11/2018 Gestational Age at Delivery: [redacted]w[redacted]d Primary OB: [facility] - Dr. CGarwin Brothers Antepartum complications:  - New onset Gestational hypertension at 38+6 weeks  - Obesity  - GBS positive  - dx of SGA ( 5/5) : 4lb 15 ( 13%) AC (6%). Pt has had antepartum fetal surveillance including NST, BPP/AFI/Dopplers. Last study yesterday. BPP 8/8, low nl AFI( 6.4), nl dopplers - Sampson trait (FOB negative)   Prenatal Labs:  ABO, Rh: --/--/A POS (05/27 1100) Antibody: NEG (05/27 1100) Rubella:  Immune RPR:   NR HBsAg:   neg HIV:   neg GBS: Positive (04/21 0000)   Admitting Diagnosis: 38+6 weeks early labor, new onset gestational hypertension   Secondary Diagnoses: Patient Active Problem List   Diagnosis Date Noted  . Indication for care in labor or delivery 07/11/2018  . Gestational hypertension 07/11/2018  . Status post vacuum-assisted vaginal delivery 07/11/2018  . Postpartum care following vaginal delivery 5/27 07/11/2018  . Perineal laceration, second degree 07/11/2018  . VISUAL ACUITY, DECREASED 04/19/2010  . OVERWEIGHT 04/15/2010  . DYSMENORRHEA 04/15/2010  . MENORRHAGIA 04/15/2010    Augmentation: Pitocin  Date of Delivery: 07/11/2018 Delivered By: Dr. CGarwin Brothers Delivery Type:  VAVD for terminal bradycardia  Anesthesia: epidural, local  Placenta: spontaneous, sent to pathology  Laceration: 2nd degree perineal ; right Vaginal sulcus Episiotomy: none  Newborn Data: Live born female  Birth Weight: 5 lb 0.8 oz (2290 g) APGAR: 2, 8  Newborn Delivery   Birth date/time:  07/11/2018 18:27:00 Delivery type:  Vaginal, Vacuum (Extractor)     Hospital/Postpartum Course  (Vaginal Delivery): Pt. Admitted at 38+6 weeks in early latent phase of labor with new onset gestational hypertension.  She received IV  PCN for GBS positive status, and progressed with IV Pitocin for labor augmentation.  She has spontaneous rupture of membranes.  See notes and delivery summary for details.  She delivered by VAVD for terminal bradycardia. Patient had an uncomplicated postpartum course.  By time of discharge on PPD#2, her pain was controlled on oral pain medications; she had appropriate lochia and was ambulating, voiding without difficulty and tolerating regular diet.  She had 2 mild range blood pressures during postpartum, with dependent edema, otherwise benign assessment, and she was sent home with HCTZ 27mPO daily x 5 days. Pre-eclampsia precautions reviewed. She was deemed stable for discharge to home.     Labs: CBC Latest Ref Rng & Units 07/12/2018 07/11/2018 07/11/2018  WBC 4.0 - 10.5 K/uL 15.7(H) 20.8(H) 12.5(H)  Hemoglobin 12.0 - 15.0 g/dL 10.7(L) 11.9(L) 11.6(L)  Hematocrit 36.0 - 46.0 % 30.7(L) 34.3(L) 33.2(L)  Platelets 150 - 400 K/uL 254 306 272   A POS  Physical exam:  BP (!) 148/85 (BP Location: Left Arm)   Pulse 88   Temp 98.1 F (36.7 C) (Axillary)   Resp 18   Ht 5' 5"  (1.651 m)   Wt 125.6 kg   LMP 10/12/2017   SpO2 98%   Breastfeeding Unknown   BMI 46.06 kg/m  General: alert and no distress Pulm: normal respiratory effort Lochia: appropriate Abdomen: soft, NT Uterine Fundus: firm, below umbilicus Perineum: healing well, no significant erythema, no significant edema Extremities: No evidence of DVT seen on physical exam. +1 pedal edema, trace lower extremity edema.   Disposition: stable, discharge to home Baby Feeding: breast milk and formula Baby  Disposition: home with mom  Rh Immune globulin given: N/A Rubella vaccine given: N/A Tdap vaccine given in AP or PP setting: UTD Flu vaccine given in AP or PP setting: UTD   Plan:  [place] was discharged to home in good condition. Follow-up appointment at [facility] in 1 week for BP check.   Discharge Instructions: Per  After Visit Summary. Refer to After Visit Summary and [facility] discharge booklet  Activity: Advance as tolerated. Pelvic rest for 6 weeks.   Diet: Regular, Heart Healthy Discharge Medications: Allergies as of 07/13/2018      Reactions   Aspirin Other (See Comments)   Reaction:  Stomach bleeding    Fish Allergy    Naproxen Other (See Comments)   Reaction:  Stomach bleeding    Nsaids    GI BLEED   Shellfish Allergy Diarrhea, Nausea And Vomiting, Swelling, Rash      Medication List    STOP taking these medications   pantoprazole 40 MG tablet Commonly known as:  PROTONIX     TAKE these medications   acetaminophen 325 MG tablet Commonly known as:  Tylenol Take 2 tablets (650 mg total) by mouth every 4 (four) hours as needed (for pain scale < 4).   hydrochlorothiazide 25 MG tablet Commonly known as:  HYDRODIURIL Take 1 tablet (25 mg total) by mouth daily.   iron polysaccharides 150 MG capsule Commonly known as:  NIFEREX Take 1 capsule (150 mg total) by mouth daily. Start taking on:  Jul 14, 2018   prenatal multivitamin Tabs tablet Take 1 tablet by mouth daily at 12 noon.            Discharge Care Instructions  (From admission, onward)         Start     Ordered   07/13/18 0000  Discharge wound care:    Comments:  Warm water sitz baths 2-3 times per day as needed for 1 week   07/13/18 1231         Outpatient follow up:  Follow-up Information    Servando Salina, MD. Schedule an appointment as soon as possible for a visit in 1 week(s).   Specialty:  Obstetrics and Gynecology Why:  Blood pressure check; then 6 weeks postpartum visit  Contact information: [place] [address] [phone]           Signed:  Lars Pinks, MSN, CNM [facility]

## 2018-07-20 DIAGNOSIS — R030 Elevated blood-pressure reading, without diagnosis of hypertension: Secondary | ICD-10-CM | POA: Diagnosis not present

## 2018-08-21 DIAGNOSIS — Z124 Encounter for screening for malignant neoplasm of cervix: Secondary | ICD-10-CM | POA: Diagnosis not present

## 2018-08-21 DIAGNOSIS — Z130 Encounter for screening for diseases of the blood and blood-forming organs and certain disorders involving the immune mechanism: Secondary | ICD-10-CM | POA: Diagnosis not present

## 2018-08-23 DIAGNOSIS — F53 Postpartum depression: Secondary | ICD-10-CM | POA: Diagnosis not present

## 2018-08-24 ENCOUNTER — Encounter (HOSPITAL_COMMUNITY): Payer: Self-pay

## 2018-08-24 ENCOUNTER — Ambulatory Visit (HOSPITAL_COMMUNITY)
Admission: EM | Admit: 2018-08-24 | Discharge: 2018-08-24 | Disposition: A | Discharge status: Home / Self Care | Payer: BC Managed Care – PPO | Attending: Internal Medicine | Admitting: Internal Medicine

## 2018-08-24 DIAGNOSIS — R1012 Left upper quadrant pain: Secondary | ICD-10-CM

## 2018-08-24 DIAGNOSIS — R1010 Upper abdominal pain, unspecified: Secondary | ICD-10-CM | POA: Diagnosis not present

## 2018-08-24 DIAGNOSIS — R748 Abnormal levels of other serum enzymes: Secondary | ICD-10-CM

## 2018-08-24 DIAGNOSIS — Z3202 Encounter for pregnancy test, result negative: Secondary | ICD-10-CM | POA: Diagnosis not present

## 2018-08-24 DIAGNOSIS — R109 Unspecified abdominal pain: Secondary | ICD-10-CM

## 2018-08-24 DIAGNOSIS — R1084 Generalized abdominal pain: Secondary | ICD-10-CM

## 2018-08-24 LAB — POCT URINALYSIS DIP (DEVICE)
Bilirubin Urine: NEGATIVE
Glucose, UA: NEGATIVE mg/dL
Hgb urine dipstick: NEGATIVE
Ketones, ur: NEGATIVE mg/dL
Nitrite: NEGATIVE
Protein, ur: NEGATIVE mg/dL
Specific Gravity, Urine: 1.02 (ref 1.005–1.030)
Urobilinogen, UA: 0.2 mg/dL (ref 0.0–1.0)
pH: 7 (ref 5.0–8.0)

## 2018-08-24 LAB — POCT PREGNANCY, URINE: Preg Test, Ur: NEGATIVE

## 2018-08-24 MED ORDER — LIDOCAINE VISCOUS HCL 2 % MT SOLN
OROMUCOSAL | Status: AC
  Filled 2018-08-24: qty 15

## 2018-08-24 MED ORDER — ESOMEPRAZOLE MAGNESIUM 20 MG PO CPDR: 20.0000 mg | DELAYED_RELEASE_CAPSULE | Freq: Every day | ORAL | 0 refills | Status: DC

## 2018-08-24 MED ORDER — ALUM & MAG HYDROXIDE-SIMETH 200-200-20 MG/5ML PO SUSP
ORAL | Status: AC
  Filled 2018-08-24: qty 30

## 2018-08-24 MED ORDER — FAMOTIDINE 20 MG PO TABS: 20.0000 mg | ORAL_TABLET | Freq: Two times a day (BID) | ORAL | 0 refills | Status: DC

## 2018-08-24 MED ORDER — LIDOCAINE VISCOUS HCL 2 % MT SOLN
15.0000 mL | Freq: Once | OROMUCOSAL | Status: AC
  Administered 2018-08-24: 15 mL via ORAL

## 2018-08-24 MED ORDER — ALUM & MAG HYDROXIDE-SIMETH 200-200-20 MG/5ML PO SUSP
30.0000 mL | Freq: Once | ORAL | Status: AC
  Administered 2018-08-24: 30 mL via ORAL

## 2018-08-24 NOTE — Discharge Instructions (Addendum)
Make sure you touch base with your GI doctor for consideration for an endoscopy or imaging such as a RUQ U/S to r/o gall bladder disease or CT scan of the abdomen.

## 2018-08-24 NOTE — ED Triage Notes (Signed)
Pt has a hx of ulcerative colitis and states this is different. Has been having abdominal pain since the 4th of July. Is breastfeeding. Lack of appetite, nausea and upper central abdominal pain that radiates to the LUQ. Has tried tylenol without relief. Postpartum 6 weeks.

## 2018-08-24 NOTE — ED Provider Notes (Addendum)
MRN: [id number] DOB: [birth year]  Subjective:   Kathleen Hamilton is a 23 y.o. female presenting for 1 week history of acute onset worsening persistent upper-left sided abdominal pain worse immediately after eating. Pain is burning type sensation, has intermittent occasional sharp pains that shoot from left side to the anus/rectal area. Has been eating much healthier in an effort to help her bowels. Has tried cutting back on her acidic foods as well. Takes stool softeners because she has had a hemorrhoid in pregnancy, has had mild bright red blood per rectum. Has a hx of ulcerative colitis, diagnosed in 2015, was treated for this that year but has since been without treatment in 2019. Is working with a GI doctor. She is currently breastfeeding.   No current facility-administered medications for this encounter.   Current Outpatient Medications:  .  acetaminophen (TYLENOL) 325 MG tablet, Take 2 tablets (650 mg total) by mouth every 4 (four) hours as needed (for pain scale < 4)., Disp: 100 tablet, Rfl: 0 .  NORLYDA 0.35 MG tablet, TK 1 T PO D, Disp: , Rfl:  .  hydrochlorothiazide (HYDRODIURIL) 25 MG tablet, Take 1 tablet (25 mg total) by mouth daily., Disp: 5 tablet, Rfl: 0 .  iron polysaccharides (NIFEREX) 150 MG capsule, Take 1 capsule (150 mg total) by mouth daily., Disp: 30 capsule, Rfl: 3 .  Prenatal Vit-Fe Fumarate-FA (PRENATAL MULTIVITAMIN) TABS tablet, Take 1 tablet by mouth daily at 12 noon., Disp: , Rfl:    Allergies  Allergen Reactions  . Aspirin Other (See Comments)    Reaction:  Stomach bleeding   . Fish Allergy   . Naproxen Other (See Comments)    Reaction:  Stomach bleeding   . Nsaids     GI BLEED  . Shellfish Allergy Diarrhea, Nausea And Vomiting, Swelling and Rash    Past Medical History:  Diagnosis Date  . Dysmenorrhea   . Endometriosis   . Heart murmur   . Ulcerative colitis [facility])      Past Surgical History:  Procedure Laterality Date  . BIOPSY BOWEL    . COLON  BIOPSY     hx of colon problems.  ? mega colon, awaiting bx results  . NO PAST SURGERIES      ROS  Objective:   Vitals: BP (!) 142/103 (BP Location: Left Arm)   Pulse 91   Temp 99.2 F (37.3 C) (Oral)   Resp 18   Wt 245 lb (111.1 kg)   LMP 10/12/2017   SpO2 98%   Breastfeeding Yes   BMI 40.77 kg/m   Physical Exam Constitutional:      General: She is not in acute distress.    Appearance: Normal appearance. She is well-developed and normal weight. She is not ill-appearing, toxic-appearing or diaphoretic.  HENT:     Head: Normocephalic and atraumatic.     Right Ear: External ear normal.     Left Ear: External ear normal.     Nose: Nose normal.     Mouth/Throat:     Mouth: Mucous membranes are moist.     Pharynx: Oropharynx is clear.  Eyes:     General: No scleral icterus.    Extraocular Movements: Extraocular movements intact.     Pupils: Pupils are equal, round, and reactive to light.  Cardiovascular:     Rate and Rhythm: Normal rate and regular rhythm.     Heart sounds: Normal heart sounds. No murmur. No friction rub. No gallop.   Pulmonary:  Effort: Pulmonary effort is normal. No respiratory distress.     Breath sounds: Normal breath sounds. No stridor. No wheezing, rhonchi or rales.  Abdominal:     General: Bowel sounds are normal. There is no distension.     Palpations: Abdomen is soft. There is no mass.     Tenderness: There is generalized abdominal tenderness and tenderness in the epigastric area, left upper quadrant and left lower quadrant. There is no right CVA tenderness, left CVA tenderness, guarding or rebound.  Skin:    General: Skin is warm and dry.     Coloration: Skin is not pale.     Findings: No rash.  Neurological:     General: No focal deficit present.     Mental Status: She is alert and oriented to person, place, and time.  Psychiatric:        Mood and Affect: Mood normal.        Behavior: Behavior normal.        Thought Content: Thought  content normal.        Judgment: Judgment normal.     Results for orders placed or performed during the hospital encounter of 08/24/18 (from the past 24 hour(s))  POCT urinalysis dip (device)     Status: Abnormal   Collection Time: 08/24/18  6:32 PM  Result Value Ref Range   Glucose, UA NEGATIVE NEGATIVE mg/dL   Bilirubin Urine NEGATIVE NEGATIVE   Ketones, ur NEGATIVE NEGATIVE mg/dL   Specific Gravity, Urine 1.020 1.005 - 1.030   Hgb urine dipstick NEGATIVE NEGATIVE   pH 7.0 5.0 - 8.0   Protein, ur NEGATIVE NEGATIVE mg/dL   Urobilinogen, UA 0.2 0.0 - 1.0 mg/dL   Nitrite NEGATIVE NEGATIVE   Leukocytes,Ua TRACE (A) NEGATIVE  Pregnancy, urine POC     Status: None   Collection Time: 08/24/18  6:45 PM  Result Value Ref Range   Preg Test, Ur NEGATIVE NEGATIVE    Assessment and Plan :   1. Upper abdominal pain   2. Generalized abdominal pain   3. LUQ pain   4. Left sided abdominal pain   5. Elevated alkaline phosphatase level     Discussed different etiologies including possible GERD, gastritis, PUD including H. Pylori, recurrent UC, cholelithiasis.  Counseled that follow-up with her PCP and GI doctor is the most viable option as I do not believe patient has an emergency patient right now.  Recommended further work-up including repeat of LFTs, consideration for endoscopy, right upper quadrant ultrasound or CT abdomen.  Patient is agreeable to this and will start GERD treatment, maintain GERD friendly diet.  ER precautions reviewed. Counseled patient on potential for adverse effects with medications prescribed today, patient verbalized understanding.    Jaynee Eagles, PA-C 08/24/18 1922    Jaynee Eagles, [place] 08/24/18 1922

## 2018-08-26 ENCOUNTER — Other Ambulatory Visit: Payer: Self-pay

## 2018-08-26 ENCOUNTER — Encounter (HOSPITAL_COMMUNITY): Payer: Self-pay

## 2018-08-26 DIAGNOSIS — R51 Headache: Secondary | ICD-10-CM | POA: Diagnosis not present

## 2018-08-26 DIAGNOSIS — H5712 Ocular pain, left eye: Secondary | ICD-10-CM | POA: Diagnosis not present

## 2018-08-26 DIAGNOSIS — I517 Cardiomegaly: Secondary | ICD-10-CM | POA: Diagnosis not present

## 2018-08-26 NOTE — ED Triage Notes (Signed)
Pt reports pain behind her L eye and in her nose x 3 days. She states that she has taken tylenol for pain. Pt is breastfeeding. Endorses photosensitivity.

## 2018-08-27 ENCOUNTER — Emergency Department (HOSPITAL_COMMUNITY): Payer: BC Managed Care – PPO

## 2018-08-27 ENCOUNTER — Emergency Department (HOSPITAL_COMMUNITY)
Admission: EM | Admit: 2018-08-27 | Discharge: 2018-08-27 | Disposition: A | Discharge status: Home / Self Care | Payer: BC Managed Care – PPO | Attending: Emergency Medicine | Admitting: Emergency Medicine

## 2018-08-27 DIAGNOSIS — I517 Cardiomegaly: Secondary | ICD-10-CM | POA: Diagnosis not present

## 2018-08-27 DIAGNOSIS — R519 Headache, unspecified: Secondary | ICD-10-CM

## 2018-08-27 DIAGNOSIS — H5712 Ocular pain, left eye: Secondary | ICD-10-CM | POA: Diagnosis not present

## 2018-08-27 LAB — CBC WITH DIFFERENTIAL/PLATELET
Abs Immature Granulocytes: 0.04 10*3/uL (ref 0.00–0.07)
Basophils Absolute: 0.1 10*3/uL (ref 0.0–0.1)
Basophils Relative: 1 %
Eosinophils Absolute: 0.7 10*3/uL — ABNORMAL HIGH (ref 0.0–0.5)
Eosinophils Relative: 5 %
HCT: 38 % (ref 36.0–46.0)
Hemoglobin: 12.7 g/dL (ref 12.0–15.0)
Immature Granulocytes: 0 %
Lymphocytes Relative: 23 %
Lymphs Abs: 3.2 10*3/uL (ref 0.7–4.0)
MCH: 31.1 pg (ref 26.0–34.0)
MCHC: 33.4 g/dL (ref 30.0–36.0)
MCV: 93.1 fL (ref 80.0–100.0)
Monocytes Absolute: 0.6 10*3/uL (ref 0.1–1.0)
Monocytes Relative: 4 %
Neutro Abs: 9.4 10*3/uL — ABNORMAL HIGH (ref 1.7–7.7)
Neutrophils Relative %: 67 %
Platelets: 480 10*3/uL — ABNORMAL HIGH (ref 150–400)
RBC: 4.08 MIL/uL (ref 3.87–5.11)
RDW: 12.3 % (ref 11.5–15.5)
WBC: 14 10*3/uL — ABNORMAL HIGH (ref 4.0–10.5)
nRBC: 0 % (ref 0.0–0.2)

## 2018-08-27 LAB — URINALYSIS, ROUTINE W REFLEX MICROSCOPIC
Bilirubin Urine: NEGATIVE
Glucose, UA: NEGATIVE mg/dL
Hgb urine dipstick: NEGATIVE
Ketones, ur: NEGATIVE mg/dL
Nitrite: NEGATIVE
Protein, ur: NEGATIVE mg/dL
Specific Gravity, Urine: 1.013 (ref 1.005–1.030)
pH: 6 (ref 5.0–8.0)

## 2018-08-27 LAB — COMPREHENSIVE METABOLIC PANEL
ALT: 15 U/L (ref 0–44)
AST: 21 U/L (ref 15–41)
Albumin: 3.8 g/dL (ref 3.5–5.0)
Alkaline Phosphatase: 56 U/L (ref 38–126)
Anion gap: 10 (ref 5–15)
BUN: 10 mg/dL (ref 6–20)
CO2: 24 mmol/L (ref 22–32)
Calcium: 9.3 mg/dL (ref 8.9–10.3)
Chloride: 106 mmol/L (ref 98–111)
Creatinine, Ser: 0.71 mg/dL (ref 0.44–1.00)
GFR calc Af Amer: >60 mL/min (ref >60)
GFR calc non Af Amer: >60 mL/min (ref >60)
Glucose, Bld: 95 mg/dL (ref 70–99)
Potassium: 4 mmol/L (ref 3.5–5.1)
Sodium: 140 mmol/L (ref 135–145)
Total Bilirubin: 0.2 mg/dL — ABNORMAL LOW (ref 0.3–1.2)
Total Protein: 7.7 g/dL (ref 6.5–8.1)

## 2018-08-27 MED ORDER — METOCLOPRAMIDE HCL 5 MG/ML IJ SOLN
10.0000 mg | Freq: Once | INTRAMUSCULAR | Status: AC
  Administered 2018-08-27: 10 mg via INTRAVENOUS
  Filled 2018-08-27: qty 2

## 2018-08-27 MED ORDER — DIPHENHYDRAMINE HCL 50 MG/ML IJ SOLN
25.0000 mg | Freq: Once | INTRAMUSCULAR | Status: AC
  Administered 2018-08-27: 04:00:00 25 mg via INTRAVENOUS
  Filled 2018-08-27: qty 1

## 2018-08-27 MED ORDER — SODIUM CHLORIDE 0.9 % IV BOLUS
1000.0000 mL | Freq: Once | INTRAVENOUS | Status: AC
  Administered 2018-08-27: 04:00:00 1000 mL via INTRAVENOUS

## 2018-08-27 MED ORDER — METHYLPREDNISOLONE SODIUM SUCC 125 MG IJ SOLR
125.0000 mg | Freq: Once | INTRAMUSCULAR | Status: AC
  Administered 2018-08-27: 125 mg via INTRAVENOUS
  Filled 2018-08-27: qty 2

## 2018-08-27 NOTE — ED Provider Notes (Signed)
Emergency Department Provider Note   I have reviewed the triage vital signs and the nursing notes.   HISTORY  Chief Complaint Eye Pain   HPI Kathleen Hamilton is a 23 y.o. female who presents the emergency department with a headache.  Patient states that she gave birth approximately 6 weeks ago and a few days ago she noticed she was having some epigastric left upper quadrant all pain so she went to urgent care they told her to follow-up with her GI doctor.  Afterwards she started having some pain behind her left eye which is progressively worsened.  Seems to be throbbing in nature and undulating in severity but never really goes away.  Has progressively worsened.  Has photophobia and nausea associated with it.  She has not noticed any lower extremity swelling or shortness of breath.  No cough.  She states upon review of systems that she did have high blood pressure and pregnancy and thus was actually induced for that.  I started her on some blood pressure medication to take 5 days after she went home but then she stopped it.  She does normally have any blood pressure issues.  Baby is otherwise healthy and breast-feeds.  Patient does have any history of fever, neck pain or other associated symptoms.   No other associated or modifying symptoms.    Past Medical History:  Diagnosis Date  . Dysmenorrhea   . Endometriosis   . Heart murmur   . Ulcerative colitis [facility])     Patient Active Problem List   Diagnosis Date Noted  . Indication for care in labor or delivery 07/11/2018  . Gestational hypertension 07/11/2018  . Status post vacuum-assisted vaginal delivery 07/11/2018  . Postpartum care following vaginal delivery 5/27 07/11/2018  . Perineal laceration, second degree 07/11/2018  . VISUAL ACUITY, DECREASED 04/19/2010  . OVERWEIGHT 04/15/2010  . DYSMENORRHEA 04/15/2010  . MENORRHAGIA 04/15/2010    Past Surgical History:  Procedure Laterality Date  . BIOPSY BOWEL    . COLON BIOPSY      hx of colon problems.  ? mega colon, awaiting bx results  . NO PAST SURGERIES      Current Outpatient Rx  . Order #: [id number] Class: Normal  . Order #: [id number] Class: Normal  . Order #: [id number] Class: Historical Med  . Order #: [id number] Class: Historical Med  . Order #: [id number] Class: Historical Med  . Order #: [id number] Class: Historical Med    Allergies Aspirin, Fish allergy, Naproxen, Nsaids, and Shellfish allergy  Family History  Problem Relation Age of Onset  . Diabetes Maternal Grandmother   . Hypertension Maternal Grandmother   . Heart disease Maternal Grandmother   . Breast cancer Maternal Grandmother        unsure age of onset  . Diabetes Paternal Grandmother   . Heart disease Paternal Grandmother   . Hypertension Paternal Grandmother   . Diabetes Paternal Grandfather   . Heart disease Paternal Grandfather   . Hypertension Paternal Grandfather     Social History Social History   Tobacco Use  . Smoking status: Never Smoker  . Smokeless tobacco: Never Used  Substance Use Topics  . Alcohol use: No  . Drug use: No    Review of Systems  All other systems negative except as documented in the HPI. All pertinent positives and negatives as reviewed in the HPI. ____________________________________________   PHYSICAL EXAM:  VITAL SIGNS: ED Triage Vitals  Enc Vitals Group     BP 08/26/18 2155 (!) 172/114  Pulse Rate 08/26/18 2155 75     Resp 08/26/18 2155 18     Temp 08/26/18 2155 98.2 F (36.8 C)     Temp Source 08/26/18 2155 Oral     SpO2 08/26/18 2155 100 %     Weight 08/26/18 2155 245 lb (111.1 kg)     Height 08/26/18 2155 5' 5"  (1.651 m)    Constitutional: Alert and oriented. Well appearing and in no acute distress. Eyes: Conjunctivae are normal. PERRL. EOMI. Consensual photophobia. Head: Atraumatic. Nose: No congestion/rhinnorhea. Mouth/Throat: Mucous membranes are moist.  Oropharynx non-erythematous. Neck: No stridor.  No meningeal  signs.   Cardiovascular: Normal rate, regular rhythm. Good peripheral circulation. Grossly normal heart sounds.   Respiratory: Normal respiratory effort.  No retractions. Lungs CTAB. Gastrointestinal: Soft and nontender. No distention.  Musculoskeletal: No lower extremity tenderness nor edema. No gross deformities of extremities. Neurologic:  Normal speech and language. No gross focal neurologic deficits are appreciated.  Skin:  Skin is warm, dry and intact. No rash noted.   ____________________________________________   LABS (all labs ordered are listed, but only abnormal results are displayed)  Labs Reviewed  URINALYSIS, ROUTINE W REFLEX MICROSCOPIC - Abnormal; Notable for the following components:      Result Value   Leukocytes,Ua TRACE (*)    Bacteria, UA RARE (*)    All other components within normal limits  CBC WITH DIFFERENTIAL/PLATELET - Abnormal; Notable for the following components:   WBC 14.0 (*)    Platelets 480 (*)    Neutro Abs 9.4 (*)    Eosinophils Absolute 0.7 (*)    All other components within normal limits  COMPREHENSIVE METABOLIC PANEL - Abnormal; Notable for the following components:   Total Bilirubin 0.2 (*)    All other components within normal limits   ____________________________________________  EKG   EKG Interpretation  Date/Time:  Monday August 27 2018 01:41:21 EDT Ventricular Rate:  79 PR Interval:    QRS Duration: 103 QT Interval:  351 QTC Calculation: 403 R Axis:   28 Text Interpretation:  Sinus rhythm Borderline Q waves in lateral leads No significant change since last tracing Confirmed by Merrily Pew [phone]) on 08/27/2018 2:08:40 AM       ____________________________________________  RADIOLOGY  Ct Head Wo Contrast  Result Date: 08/27/2018 CLINICAL DATA:  Left eye pain, photosensitivity EXAM: CT HEAD WITHOUT CONTRAST TECHNIQUE: Contiguous axial images were obtained from the base of the skull through the vertex without intravenous  contrast. COMPARISON:  None. FINDINGS: Brain: No evidence of acute infarction, hemorrhage, hydrocephalus, extra-axial collection or mass lesion/mass effect. Vascular: No hyperdense vessel or unexpected calcification. Skull: Normal. Negative for fracture or focal lesion. Sinuses/Orbits: No acute finding. Patchy mucosal thickening in the ethmoid sinuses. Other: None IMPRESSION: Negative non contrasted CT appearance of the brain Electronically Signed   By: Donavan Foil M.D.   On: 08/27/2018 02:17   Dg Chest Portable 1 View  Result Date: 08/27/2018 CLINICAL DATA:  Pain in left eye EXAM: PORTABLE CHEST 1 VIEW COMPARISON:  03/13/2018 FINDINGS: Borderline cardiomegaly. No consolidation or effusion. No pneumothorax. IMPRESSION: No active disease.  Borderline cardiomegaly Electronically Signed   By: Donavan Foil M.D.   On: 08/27/2018 02:24    ____________________________________________   PROCEDURES  Procedure(s) performed:   Procedures   ____________________________________________   INITIAL IMPRESSION / ASSESSMENT AND PLAN / ED COURSE  Work-up for preeclampsia.  BP improved without intervention. Headache improved with cocktail. Ct/labs reassuring. No RF for temporal arteritis. Will dc to  fu w/ pcp for better headache control.      Pertinent labs & imaging results that were available during my care of the patient were reviewed by me and considered in my medical decision making (see chart for details).  A medical screening exam was performed and I feel the patient has had an appropriate workup for their chief complaint at this time and likelihood of emergent condition existing is low. They have been counseled on decision, discharge, follow up and which symptoms necessitate immediate return to the emergency department. They or their family verbally stated understanding and agreement with plan and discharged in stable condition.   ____________________________________________  FINAL CLINICAL  IMPRESSION(S) / ED DIAGNOSES  Final diagnoses:  Nonintractable headache, unspecified chronicity pattern, unspecified headache type     MEDICATIONS GIVEN DURING THIS VISIT:  Medications  metoCLOPramide (REGLAN) injection 10 mg (10 mg Intravenous Given 08/27/18 0410)  diphenhydrAMINE (BENADRYL) injection 25 mg (25 mg Intravenous Given 08/27/18 0409)  methylPREDNISolone sodium succinate (SOLU-MEDROL) 125 mg/2 mL injection 125 mg (125 mg Intravenous Given 08/27/18 0407)  sodium chloride 0.9 % bolus 1,000 mL (0 mLs Intravenous Stopped 08/27/18 0509)     NEW OUTPATIENT MEDICATIONS STARTED DURING THIS VISIT:  Discharge Medication List as of 08/27/2018  5:50 AM      Note:  This note was prepared with assistance of Dragon voice recognition software. Occasional wrong-word or sound-a-like substitutions may have occurred due to the inherent limitations of voice recognition software.   Drew Herman, Corene Cornea, MD 08/27/18 [phone]

## 2018-08-27 NOTE — ED Notes (Signed)
Pt ambulated to the bathroom without assistance. Gait steady

## 2018-08-27 NOTE — ED Notes (Signed)
Urine culture sent to the lab. 

## 2018-08-27 NOTE — ED Notes (Signed)
Patient transported to CT 

## 2018-08-27 NOTE — ED Notes (Signed)
Pt verbalized discharge instructions and follow up care. Alert and ambulatory. No IV.  

## 2018-08-29 DIAGNOSIS — J0100 Acute maxillary sinusitis, unspecified: Secondary | ICD-10-CM | POA: Diagnosis not present

## 2018-08-29 DIAGNOSIS — H1032 Unspecified acute conjunctivitis, left eye: Secondary | ICD-10-CM | POA: Diagnosis not present

## 2018-08-30 DIAGNOSIS — F53 Postpartum depression: Secondary | ICD-10-CM | POA: Diagnosis not present

## 2018-09-13 DIAGNOSIS — F53 Postpartum depression: Secondary | ICD-10-CM | POA: Diagnosis not present

## 2018-09-17 DIAGNOSIS — K5190 Ulcerative colitis, unspecified, without complications: Secondary | ICD-10-CM | POA: Diagnosis not present

## 2018-09-17 DIAGNOSIS — R1013 Epigastric pain: Secondary | ICD-10-CM | POA: Diagnosis not present

## 2018-09-20 DIAGNOSIS — F53 Postpartum depression: Secondary | ICD-10-CM | POA: Diagnosis not present

## 2018-10-04 DIAGNOSIS — F4323 Adjustment disorder with mixed anxiety and depressed mood: Secondary | ICD-10-CM | POA: Diagnosis not present

## 2018-10-11 DIAGNOSIS — F4323 Adjustment disorder with mixed anxiety and depressed mood: Secondary | ICD-10-CM | POA: Diagnosis not present

## 2018-10-18 DIAGNOSIS — F4323 Adjustment disorder with mixed anxiety and depressed mood: Secondary | ICD-10-CM | POA: Diagnosis not present

## 2018-10-19 DIAGNOSIS — Z1159 Encounter for screening for other viral diseases: Secondary | ICD-10-CM | POA: Diagnosis not present

## 2018-10-22 DIAGNOSIS — F4323 Adjustment disorder with mixed anxiety and depressed mood: Secondary | ICD-10-CM | POA: Diagnosis not present

## 2018-10-25 DIAGNOSIS — Z8719 Personal history of other diseases of the digestive system: Secondary | ICD-10-CM | POA: Diagnosis not present

## 2018-10-25 DIAGNOSIS — R1013 Epigastric pain: Secondary | ICD-10-CM | POA: Diagnosis not present

## 2018-10-25 DIAGNOSIS — K648 Other hemorrhoids: Secondary | ICD-10-CM | POA: Diagnosis not present

## 2018-10-25 DIAGNOSIS — R109 Unspecified abdominal pain: Secondary | ICD-10-CM | POA: Diagnosis not present

## 2018-10-30 ENCOUNTER — Other Ambulatory Visit: Payer: Self-pay | Admitting: Gastroenterology

## 2018-10-30 DIAGNOSIS — R109 Unspecified abdominal pain: Secondary | ICD-10-CM

## 2018-10-31 DIAGNOSIS — F4323 Adjustment disorder with mixed anxiety and depressed mood: Secondary | ICD-10-CM | POA: Diagnosis not present

## 2018-11-06 ENCOUNTER — Ambulatory Visit
Admission: RE | Admit: 2018-11-06 | Discharge: 2018-11-06 | Disposition: A | Discharge status: Home / Self Care | Payer: Medicaid Other | Source: Ambulatory Visit | Attending: Gastroenterology | Admitting: Gastroenterology

## 2018-11-06 DIAGNOSIS — R109 Unspecified abdominal pain: Secondary | ICD-10-CM | POA: Diagnosis not present

## 2018-11-06 DIAGNOSIS — F4323 Adjustment disorder with mixed anxiety and depressed mood: Secondary | ICD-10-CM | POA: Diagnosis not present

## 2018-11-14 DIAGNOSIS — F4323 Adjustment disorder with mixed anxiety and depressed mood: Secondary | ICD-10-CM | POA: Diagnosis not present

## 2018-11-15 DIAGNOSIS — Z0000 Encounter for general adult medical examination without abnormal findings: Secondary | ICD-10-CM | POA: Diagnosis not present

## 2018-11-21 DIAGNOSIS — F4323 Adjustment disorder with mixed anxiety and depressed mood: Secondary | ICD-10-CM | POA: Diagnosis not present

## 2018-11-22 DIAGNOSIS — Z0000 Encounter for general adult medical examination without abnormal findings: Secondary | ICD-10-CM | POA: Diagnosis not present

## 2018-11-22 DIAGNOSIS — Z23 Encounter for immunization: Secondary | ICD-10-CM | POA: Diagnosis not present

## 2018-11-22 DIAGNOSIS — Z13220 Encounter for screening for lipoid disorders: Secondary | ICD-10-CM | POA: Diagnosis not present

## 2018-12-05 DIAGNOSIS — F4323 Adjustment disorder with mixed anxiety and depressed mood: Secondary | ICD-10-CM | POA: Diagnosis not present

## 2018-12-07 DIAGNOSIS — D72829 Elevated white blood cell count, unspecified: Secondary | ICD-10-CM | POA: Diagnosis not present

## 2018-12-20 DIAGNOSIS — F4323 Adjustment disorder with mixed anxiety and depressed mood: Secondary | ICD-10-CM | POA: Diagnosis not present

## 2018-12-26 DIAGNOSIS — F4323 Adjustment disorder with mixed anxiety and depressed mood: Secondary | ICD-10-CM | POA: Diagnosis not present

## 2019-01-02 DIAGNOSIS — F4323 Adjustment disorder with mixed anxiety and depressed mood: Secondary | ICD-10-CM | POA: Diagnosis not present

## 2019-01-09 DIAGNOSIS — F4323 Adjustment disorder with mixed anxiety and depressed mood: Secondary | ICD-10-CM | POA: Diagnosis not present

## 2019-01-16 DIAGNOSIS — F4323 Adjustment disorder with mixed anxiety and depressed mood: Secondary | ICD-10-CM | POA: Diagnosis not present

## 2019-01-24 ENCOUNTER — Other Ambulatory Visit: Payer: Self-pay

## 2019-01-24 DIAGNOSIS — Z20822 Contact with and (suspected) exposure to covid-19: Secondary | ICD-10-CM

## 2019-01-25 LAB — NOVEL CORONAVIRUS, NAA: SARS-CoV-2, NAA: NOT DETECTED

## 2019-01-28 DIAGNOSIS — Z113 Encounter for screening for infections with a predominantly sexual mode of transmission: Secondary | ICD-10-CM | POA: Diagnosis not present

## 2019-01-30 DIAGNOSIS — F4323 Adjustment disorder with mixed anxiety and depressed mood: Secondary | ICD-10-CM | POA: Diagnosis not present

## 2019-02-11 DIAGNOSIS — D72829 Elevated white blood cell count, unspecified: Secondary | ICD-10-CM | POA: Diagnosis not present

## 2019-02-20 DIAGNOSIS — U071 COVID-19: Secondary | ICD-10-CM | POA: Diagnosis not present

## 2019-02-20 DIAGNOSIS — Z6839 Body mass index (BMI) 39.0-39.9, adult: Secondary | ICD-10-CM | POA: Diagnosis not present

## 2019-03-19 DIAGNOSIS — L509 Urticaria, unspecified: Secondary | ICD-10-CM | POA: Diagnosis not present

## 2019-05-06 ENCOUNTER — Emergency Department (HOSPITAL_COMMUNITY)
Admission: EM | Admit: 2019-05-06 | Discharge: 2019-05-06 | Disposition: A | Discharge status: Home / Self Care | Payer: Medicaid Other | Attending: Emergency Medicine | Admitting: Emergency Medicine

## 2019-05-06 ENCOUNTER — Other Ambulatory Visit: Payer: Self-pay

## 2019-05-06 ENCOUNTER — Encounter (HOSPITAL_COMMUNITY): Payer: Self-pay | Admitting: Emergency Medicine

## 2019-05-06 DIAGNOSIS — R112 Nausea with vomiting, unspecified: Secondary | ICD-10-CM

## 2019-05-06 DIAGNOSIS — Z79899 Other long term (current) drug therapy: Secondary | ICD-10-CM | POA: Diagnosis not present

## 2019-05-06 DIAGNOSIS — R197 Diarrhea, unspecified: Secondary | ICD-10-CM | POA: Diagnosis not present

## 2019-05-06 DIAGNOSIS — R1110 Vomiting, unspecified: Secondary | ICD-10-CM | POA: Diagnosis present

## 2019-05-06 LAB — CBC WITH DIFFERENTIAL/PLATELET
Abs Immature Granulocytes: 0.04 10*3/uL (ref 0.00–0.07)
Basophils Absolute: 0 10*3/uL (ref 0.0–0.1)
Basophils Relative: 0 %
Eosinophils Absolute: 0 10*3/uL (ref 0.0–0.5)
Eosinophils Relative: 0 %
HCT: 39.2 % (ref 36.0–46.0)
Hemoglobin: 13 g/dL (ref 12.0–15.0)
Immature Granulocytes: 0 %
Lymphocytes Relative: 6 %
Lymphs Abs: 0.6 10*3/uL — ABNORMAL LOW (ref 0.7–4.0)
MCH: 29 pg (ref 26.0–34.0)
MCHC: 33.2 g/dL (ref 30.0–36.0)
MCV: 87.3 fL (ref 80.0–100.0)
Monocytes Absolute: 0.5 10*3/uL (ref 0.1–1.0)
Monocytes Relative: 5 %
Neutro Abs: 8.8 10*3/uL — ABNORMAL HIGH (ref 1.7–7.7)
Neutrophils Relative %: 89 %
Platelets: 424 10*3/uL — ABNORMAL HIGH (ref 150–400)
RBC: 4.49 MIL/uL (ref 3.87–5.11)
RDW: 15.4 % (ref 11.5–15.5)
WBC: 10 10*3/uL (ref 4.0–10.5)
nRBC: 0 % (ref 0.0–0.2)

## 2019-05-06 LAB — COMPREHENSIVE METABOLIC PANEL
ALT: 15 U/L (ref 0–44)
AST: 17 U/L (ref 15–41)
Albumin: 3.8 g/dL (ref 3.5–5.0)
Alkaline Phosphatase: 47 U/L (ref 38–126)
Anion gap: 12 (ref 5–15)
BUN: 11 mg/dL (ref 6–20)
CO2: 23 mmol/L (ref 22–32)
Calcium: 8.5 mg/dL — ABNORMAL LOW (ref 8.9–10.3)
Chloride: 104 mmol/L (ref 98–111)
Creatinine, Ser: 0.49 mg/dL (ref 0.44–1.00)
GFR calc Af Amer: >60 mL/min (ref >60)
GFR calc non Af Amer: >60 mL/min (ref >60)
Glucose, Bld: 106 mg/dL — ABNORMAL HIGH (ref 70–99)
Potassium: 3.6 mmol/L (ref 3.5–5.1)
Sodium: 139 mmol/L (ref 135–145)
Total Bilirubin: 0.8 mg/dL (ref 0.3–1.2)
Total Protein: 7.7 g/dL (ref 6.5–8.1)

## 2019-05-06 LAB — I-STAT BETA HCG BLOOD, ED (MC, WL, AP ONLY): I-stat hCG, quantitative: <5 m[IU]/mL (ref <5)

## 2019-05-06 LAB — LIPASE, BLOOD: Lipase: 17 U/L (ref 11–51)

## 2019-05-06 MED ORDER — SODIUM CHLORIDE 0.9 % IV BOLUS
1000.0000 mL | Freq: Once | INTRAVENOUS | Status: AC
  Administered 2019-05-06: 14:00:00 1000 mL via INTRAVENOUS

## 2019-05-06 MED ORDER — ALUMINUM-MAGNESIUM-SIMETHICONE 200-200-20 MG/5ML PO SUSP: 30.0000 mL | Freq: Three times a day (TID) | ORAL | 0 refills | Status: DC

## 2019-05-06 MED ORDER — ONDANSETRON HCL 4 MG/2ML IJ SOLN
4.0000 mg | Freq: Once | INTRAMUSCULAR | Status: AC
  Administered 2019-05-06: 14:00:00 4 mg via INTRAVENOUS
  Filled 2019-05-06: qty 2

## 2019-05-06 MED ORDER — ONDANSETRON 4 MG PO TBDP: 4.0000 mg | ORAL_TABLET | Freq: Three times a day (TID) | ORAL | 0 refills | Status: DC | PRN

## 2019-05-06 MED ORDER — MORPHINE SULFATE (PF) 4 MG/ML IV SOLN
4.0000 mg | Freq: Once | INTRAVENOUS | Status: AC
  Administered 2019-05-06: 14:00:00 4 mg via INTRAVENOUS
  Filled 2019-05-06: qty 1

## 2019-05-06 NOTE — ED Triage Notes (Signed)
Patient here from home with complaints of nausea, vomiting, diarrhea that started yesterday with generalized body aches. Reports that she is unable to control bowel and bladder.

## 2019-05-06 NOTE — ED Provider Notes (Signed)
[place] DEPT Provider Note   CSN: [id number] Arrival date & time: 05/06/19  1308     History Chief Complaint  Patient presents with  . Nausea  . Emesis  . Diarrhea    Kathleen Hamilton is a 24 y.o. female with a past medical history ulcerative colitis, presenting to the ED with chief complaint of vomiting and diarrhea.  This morning about 12 hours ago had acute onset of cramping abdominal pain, several episodes of nonbloody, nonbilious emesis and nonbloody diarrhea.  She denies any suspicious food ingestions and has not taken any medications to help with her symptoms.  She reports frequent urination but denies any dysuria.  No sick contacts with similar symptoms.  She feels that this is different from her usual ulcerative colitis flareups and is not concerned about a flareup.  She denies any fever, possibility of pregnancy, vaginal complaints, chest pain or shortness of breath.  HPI     Past Medical History:  Diagnosis Date  . Dysmenorrhea   . Endometriosis   . Heart murmur   . Ulcerative colitis [facility])     Patient Active Problem List   Diagnosis Date Noted  . Indication for care in labor or delivery 07/11/2018  . Gestational hypertension 07/11/2018  . Status post vacuum-assisted vaginal delivery 07/11/2018  . Postpartum care following vaginal delivery 5/27 07/11/2018  . Perineal laceration, second degree 07/11/2018  . VISUAL ACUITY, DECREASED 04/19/2010  . OVERWEIGHT 04/15/2010  . DYSMENORRHEA 04/15/2010  . MENORRHAGIA 04/15/2010    Past Surgical History:  Procedure Laterality Date  . BIOPSY BOWEL    . COLON BIOPSY     hx of colon problems.  ? mega colon, awaiting bx results  . NO PAST SURGERIES       OB History    Gravida  2   Para  1   Term  1   Preterm      AB  1   Living  1     SAB  1   TAB      Ectopic      Multiple  0   Live Births  1           Family History  Problem Relation Age of Onset  .  Diabetes Maternal Grandmother   . Hypertension Maternal Grandmother   . Heart disease Maternal Grandmother   . Breast cancer Maternal Grandmother        unsure age of onset  . Diabetes Paternal Grandmother   . Heart disease Paternal Grandmother   . Hypertension Paternal Grandmother   . Diabetes Paternal Grandfather   . Heart disease Paternal Grandfather   . Hypertension Paternal Grandfather     Social History   Tobacco Use  . Smoking status: Never Smoker  . Smokeless tobacco: Never Used  Substance Use Topics  . Alcohol use: No  . Drug use: No    Home Medications Prior to Admission medications   Medication Sig Start Date End Date Taking? Authorizing Provider  acetaminophen (TYLENOL) 325 MG tablet Take 2 tablets (650 mg total) by mouth every 4 (four) hours as needed (for pain scale < 4). 07/13/18  Yes Sigmon, Meredith C, CNM  aluminum-magnesium hydroxide-simethicone (MAALOX) 660-630-16 MG/5ML SUSP Take 30 mLs by mouth 4 (four) times daily -  before meals and at bedtime. 05/06/19   Lavert Matousek, PA-C  esomeprazole (NEXIUM) 20 MG capsule Take 1 capsule (20 mg total) by mouth daily before breakfast. Patient not taking: Reported on  05/06/2019 08/24/18   Jaynee Eagles, PA-C  ondansetron (ZOFRAN ODT) 4 MG disintegrating tablet Take 1 tablet (4 mg total) by mouth every 8 (eight) hours as needed for nausea or vomiting. 05/06/19   Elaria Osias, PA-C  famotidine (PEPCID) 20 MG tablet Take 1 tablet (20 mg total) by mouth 2 (two) times daily. Patient not taking: Reported on 08/27/2018 08/24/18 08/27/18  Jaynee Eagles, PA-C  hydrochlorothiazide (HYDRODIURIL) 25 MG tablet Take 1 tablet (25 mg total) by mouth daily. Patient not taking: Reported on 08/27/2018 07/13/18 08/27/18  Darliss Cheney, CNM  iron polysaccharides (NIFEREX) 150 MG capsule Take 1 capsule (150 mg total) by mouth daily. Patient not taking: Reported on 08/27/2018 07/14/18 08/27/18  Darliss Cheney, CNM    Allergies    Aspirin, Fish  allergy, Naproxen, Nsaids, and Shellfish allergy  Review of Systems   Review of Systems  Constitutional: Negative for appetite change, chills and fever.  HENT: Negative for ear pain, rhinorrhea, sneezing and sore throat.   Eyes: Negative for photophobia and visual disturbance.  Respiratory: Negative for cough, chest tightness, shortness of breath and wheezing.   Cardiovascular: Negative for chest pain and palpitations.  Gastrointestinal: Positive for abdominal pain, diarrhea, nausea and vomiting. Negative for blood in stool and constipation.  Genitourinary: Positive for frequency. Negative for dysuria, hematuria and urgency.  Musculoskeletal: Negative for myalgias.  Skin: Negative for rash.  Neurological: Negative for dizziness, weakness and light-headedness.    Physical Exam Updated Vital Signs BP (!) 157/94   Pulse 93   Temp 98.6 F (37 C) (Oral)   Resp 15   SpO2 100%   Physical Exam Vitals and nursing note reviewed.  Constitutional:      General: She is not in acute distress.    Appearance: She is well-developed.  HENT:     Head: Normocephalic and atraumatic.     Nose: Nose normal.  Eyes:     General: No scleral icterus.       Left eye: No discharge.     Conjunctiva/sclera: Conjunctivae normal.  Cardiovascular:     Rate and Rhythm: Normal rate and regular rhythm.     Heart sounds: Normal heart sounds. No murmur. No friction rub. No gallop.   Pulmonary:     Effort: Pulmonary effort is normal. No respiratory distress.     Breath sounds: Normal breath sounds.  Abdominal:     General: Bowel sounds are normal. There is no distension.     Palpations: Abdomen is soft.     Tenderness: There is abdominal tenderness. There is no guarding.    Musculoskeletal:        General: Normal range of motion.     Cervical back: Normal range of motion and neck supple.  Skin:    General: Skin is warm and dry.     Findings: No rash.  Neurological:     Mental Status: She is alert.      Motor: No abnormal muscle tone.     Coordination: Coordination normal.     ED Results / Procedures / Treatments   Labs (all labs ordered are listed, but only abnormal results are displayed) Labs Reviewed  COMPREHENSIVE METABOLIC PANEL - Abnormal; Notable for the following components:      Result Value   Glucose, Bld 106 (*)    Calcium 8.5 (*)    All other components within normal limits  CBC WITH DIFFERENTIAL/PLATELET - Abnormal; Notable for the following components:   Platelets 424 (*)  Neutro Abs 8.8 (*)    Lymphs Abs 0.6 (*)    All other components within normal limits  LIPASE, BLOOD  I-STAT BETA HCG BLOOD, ED ([facility], [facility], [facility] ONLY)    EKG None  Radiology No results found.  Procedures Procedures (including critical care time)  Medications Ordered in ED Medications  morphine 4 MG/ML injection 4 mg (4 mg Intravenous Given 05/06/19 1420)  sodium chloride 0.9 % bolus 1,000 mL (0 mLs Intravenous Stopped 05/06/19 1445)  ondansetron (ZOFRAN) injection 4 mg (4 mg Intravenous Given 05/06/19 1420)    ED Course  I have reviewed the triage vital signs and the nursing notes.  Pertinent labs & imaging results that were available during my care of the patient were reviewed by me and considered in my medical decision making (see chart for details).    MDM Rules/Calculators/A&P                      24 year old female with a past medical history of ulcerative colitis presents to the ED with a chief complaint of vomiting and diarrhea.  12 hours ago had acute onset of cramping abdominal pain, several episodes of nonbloody, nonbilious emesis and nonbloody diarrhea.  On exam patient has some tenderness palpation of the abdomen without any focal lower tenderness.  She denies any urinary symptoms or possibility of pregnancy.  Work appears significant for no leukocytosis, normal CMP, lipase and negative hCG.  Patient was given IV fluids, Zofran and morphine here with significant improvement in  her symptoms.  Repeat abdominal exams are benign.  She is able to tolerate liquids without difficulty.  Suspect that her symptoms could be viral in nature.  Doubt appendicitis, cholecystitis, ulcerative colitis flareup or other emergent cause of her symptoms. No antibiotic use recently would predispose her to C. difficile.  She is comfortable with discharged home with PCP follow-up and symptomatic control. Advised to slowly advance her diet as tolerated.  Patient is hemodynamically stable, in NAD, and able to ambulate in the ED. Evaluation does not show pathology that would require ongoing emergent intervention or inpatient treatment. I have personally reviewed and interpreted all lab work and imaging at today's ED visit. I explained the diagnosis to the patient. Pain has been managed and has no complaints prior to discharge. Patient is comfortable with above plan and is stable for discharge at this time. All questions were answered prior to disposition. Strict return precautions for returning to the ED were discussed. Encouraged follow up with PCP.   An After Visit Summary was printed and given to the patient.   Portions of this note were generated with [employer]. Dictation errors may occur despite best attempts at proofreading.  Final Clinical Impression(s) / ED Diagnoses Final diagnoses:  Nausea vomiting and diarrhea    Rx / DC Orders ED Discharge Orders         Ordered    ondansetron (ZOFRAN ODT) 4 MG disintegrating tablet  Every 8 hours PRN     05/06/19 1500    aluminum-magnesium hydroxide-simethicone (MAALOX) 200-200-20 MG/5ML SUSP  3 times daily before meals & bedtime     05/06/19 1500           Delia Heady, PA-C 05/06/19 1518    Tegeler, Gwenyth Allegra, MD 05/06/19 2032

## 2019-05-06 NOTE — ED Notes (Signed)
Bedside commode has been placed.

## 2019-05-06 NOTE — ED Notes (Signed)
Attempt peripheral IV to left fa and right hand unsuccessful; but was able to obtain labs from right hand with attempt.

## 2019-05-06 NOTE — Discharge Instructions (Addendum)
Take the medications AS NEEDED to help with your symptoms. It is important for you to increase your hydration with water and other fluids. Slowly advance her diet as tolerated. Return to the ED if you start to experience worsening abdominal pain, bloody stools or bloody vomiting, uncontrollable vomiting or fever.

## 2019-05-16 DIAGNOSIS — F4323 Adjustment disorder with mixed anxiety and depressed mood: Secondary | ICD-10-CM | POA: Diagnosis not present

## 2019-05-24 DIAGNOSIS — R1010 Upper abdominal pain, unspecified: Secondary | ICD-10-CM | POA: Diagnosis not present

## 2019-05-27 DIAGNOSIS — F4323 Adjustment disorder with mixed anxiety and depressed mood: Secondary | ICD-10-CM | POA: Diagnosis not present

## 2019-06-06 DIAGNOSIS — F4323 Adjustment disorder with mixed anxiety and depressed mood: Secondary | ICD-10-CM | POA: Diagnosis not present

## 2019-06-20 DIAGNOSIS — F4323 Adjustment disorder with mixed anxiety and depressed mood: Secondary | ICD-10-CM | POA: Diagnosis not present

## 2019-07-04 DIAGNOSIS — F4323 Adjustment disorder with mixed anxiety and depressed mood: Secondary | ICD-10-CM | POA: Diagnosis not present

## 2019-07-18 DIAGNOSIS — F4323 Adjustment disorder with mixed anxiety and depressed mood: Secondary | ICD-10-CM | POA: Diagnosis not present

## 2019-07-25 DIAGNOSIS — F4323 Adjustment disorder with mixed anxiety and depressed mood: Secondary | ICD-10-CM | POA: Diagnosis not present

## 2019-08-01 DIAGNOSIS — F4323 Adjustment disorder with mixed anxiety and depressed mood: Secondary | ICD-10-CM | POA: Diagnosis not present

## 2019-08-15 DIAGNOSIS — F4323 Adjustment disorder with mixed anxiety and depressed mood: Secondary | ICD-10-CM | POA: Diagnosis not present

## 2019-08-22 DIAGNOSIS — F4323 Adjustment disorder with mixed anxiety and depressed mood: Secondary | ICD-10-CM | POA: Diagnosis not present

## 2019-09-05 DIAGNOSIS — F4323 Adjustment disorder with mixed anxiety and depressed mood: Secondary | ICD-10-CM | POA: Diagnosis not present

## 2019-09-14 ENCOUNTER — Other Ambulatory Visit: Payer: Self-pay

## 2019-09-14 ENCOUNTER — Encounter (HOSPITAL_COMMUNITY): Payer: Self-pay | Admitting: Urgent Care

## 2019-09-14 ENCOUNTER — Ambulatory Visit (HOSPITAL_COMMUNITY)
Admission: EM | Admit: 2019-09-14 | Discharge: 2019-09-14 | Disposition: A | Discharge status: Home / Self Care | Payer: Medicaid Other | Attending: Urgent Care | Admitting: Urgent Care

## 2019-09-14 DIAGNOSIS — Z3202 Encounter for pregnancy test, result negative: Secondary | ICD-10-CM

## 2019-09-14 DIAGNOSIS — R1030 Lower abdominal pain, unspecified: Secondary | ICD-10-CM

## 2019-09-14 DIAGNOSIS — Z7251 High risk heterosexual behavior: Secondary | ICD-10-CM

## 2019-09-14 DIAGNOSIS — R102 Pelvic and perineal pain unspecified side: Secondary | ICD-10-CM

## 2019-09-14 DIAGNOSIS — R300 Dysuria: Secondary | ICD-10-CM

## 2019-09-14 DIAGNOSIS — N390 Urinary tract infection, site not specified: Secondary | ICD-10-CM

## 2019-09-14 DIAGNOSIS — M545 Low back pain, unspecified: Secondary | ICD-10-CM

## 2019-09-14 LAB — POCT URINALYSIS DIP (DEVICE)
Bilirubin Urine: NEGATIVE
Glucose, UA: NEGATIVE mg/dL
Hgb urine dipstick: NEGATIVE
Ketones, ur: NEGATIVE mg/dL
Nitrite: NEGATIVE
Protein, ur: NEGATIVE mg/dL
Specific Gravity, Urine: 1.02 (ref 1.005–1.030)
Urobilinogen, UA: 0.2 mg/dL (ref 0.0–1.0)
pH: 7 (ref 5.0–8.0)

## 2019-09-14 LAB — POC URINE PREG, ED: Preg Test, Ur: NEGATIVE

## 2019-09-14 MED ORDER — CEFTRIAXONE SODIUM 500 MG IJ SOLR
500.0000 mg | Freq: Once | INTRAMUSCULAR | Status: AC
  Administered 2019-09-14: 500 mg via INTRAMUSCULAR

## 2019-09-14 MED ORDER — AZITHROMYCIN 250 MG PO TABS
1000.0000 mg | ORAL_TABLET | Freq: Once | ORAL | Status: AC
  Administered 2019-09-14: 1000 mg via ORAL

## 2019-09-14 MED ORDER — AZITHROMYCIN 250 MG PO TABS
ORAL_TABLET | ORAL | Status: AC
  Filled 2019-09-14: qty 4

## 2019-09-14 MED ORDER — LIDOCAINE HCL (PF) 1 % IJ SOLN
INTRAMUSCULAR | Status: AC
  Filled 2019-09-14: qty 2

## 2019-09-14 MED ORDER — CEFTRIAXONE SODIUM 500 MG IJ SOLR
INTRAMUSCULAR | Status: AC
  Filled 2019-09-14: qty 500

## 2019-09-14 NOTE — ED Provider Notes (Signed)
[place]   MRN: [id number] DOB: 11-Nov-1995  Subjective:   Kathleen Hamilton is a 24 y.o. female presenting for 2-day history of acute onset lower abdominal/pelvic pain, dysuria, malodorous urine, cloudy urine, lower back pain.  Patient states that she was previously sexually active with one female partner, was using condoms.  But they broke up and then she started having sex with her father's baby again, no condom use.  She had her period this past week.  Would like to make sure she does not have STIs as well.  No current facility-administered medications for this encounter.  Current Outpatient Medications:  .  Probiotic Product (PROBIOTIC-10 PO), Take by mouth., Disp: , Rfl:  .  acetaminophen (TYLENOL) 325 MG tablet, Take 2 tablets (650 mg total) by mouth every 4 (four) hours as needed (for pain scale < 4)., Disp: 100 tablet, Rfl: 0 .  aluminum-magnesium hydroxide-simethicone (MAALOX) 528-413-24 MG/5ML SUSP, Take 30 mLs by mouth 4 (four) times daily -  before meals and at bedtime., Disp: 1680 mL, Rfl: 0 .  esomeprazole (NEXIUM) 20 MG capsule, Take 1 capsule (20 mg total) by mouth daily before breakfast. (Patient not taking: Reported on 05/06/2019), Disp: 60 capsule, Rfl: 0 .  ondansetron (ZOFRAN ODT) 4 MG disintegrating tablet, Take 1 tablet (4 mg total) by mouth every 8 (eight) hours as needed for nausea or vomiting., Disp: 4 tablet, Rfl: 0   Allergies  Allergen Reactions  . Aspirin Other (See Comments)    Reaction:  Stomach bleeding   . Fish Allergy   . Naproxen Other (See Comments)    Reaction:  Stomach bleeding   . Nsaids     GI BLEED  . Shellfish Allergy Diarrhea, Nausea And Vomiting, Swelling and Rash    Past Medical History:  Diagnosis Date  . Dysmenorrhea   . Endometriosis   . Heart murmur   . Ulcerative colitis [facility])      Past Surgical History:  Procedure Laterality Date  . BIOPSY BOWEL    . COLON BIOPSY     hx of colon problems.  ? mega colon, awaiting bx  results  . NO PAST SURGERIES      Family History  Problem Relation Age of Onset  . Diabetes Maternal Grandmother   . Hypertension Maternal Grandmother   . Heart disease Maternal Grandmother   . Breast cancer Maternal Grandmother        unsure age of onset  . Diabetes Paternal Grandmother   . Heart disease Paternal Grandmother   . Hypertension Paternal Grandmother   . Diabetes Paternal Grandfather   . Heart disease Paternal Grandfather   . Hypertension Paternal Grandfather     Social History   Tobacco Use  . Smoking status: Never Smoker  . Smokeless tobacco: Never Used  Vaping Use  . Vaping Use: Never used  Substance Use Topics  . Alcohol use: No  . Drug use: No    ROS   Objective:   Vitals: Ht 5' 5"  (1.651 m)   Wt (!) 230 lb (104.3 kg)   BMI 38.27 kg/m   Physical Exam Constitutional:      General: She is not in acute distress.    Appearance: Normal appearance. She is well-developed and normal weight. She is not ill-appearing, toxic-appearing or diaphoretic.  HENT:     Head: Normocephalic and atraumatic.     Right Ear: External ear normal.     Left Ear: External ear normal.     Nose: Nose normal.  Mouth/Throat:     Mouth: Mucous membranes are moist.     Pharynx: Oropharynx is clear.  Eyes:     General: No scleral icterus.    Extraocular Movements: Extraocular movements intact.     Pupils: Pupils are equal, round, and reactive to light.  Cardiovascular:     Rate and Rhythm: Normal rate and regular rhythm.     Pulses: Normal pulses.     Heart sounds: Normal heart sounds. No murmur heard.  No friction rub. No gallop.   Pulmonary:     Effort: Pulmonary effort is normal. No respiratory distress.     Breath sounds: Normal breath sounds. No stridor. No wheezing, rhonchi or rales.  Abdominal:     General: Bowel sounds are normal. There is no distension.     Palpations: Abdomen is soft. There is no mass.     Tenderness: There is no abdominal tenderness.  There is no right CVA tenderness, left CVA tenderness, guarding or rebound.  Skin:    General: Skin is warm and dry.     Coloration: Skin is not pale.     Findings: No rash.  Neurological:     General: No focal deficit present.     Mental Status: She is alert and oriented to person, place, and time.  Psychiatric:        Mood and Affect: Mood normal.        Behavior: Behavior normal.        Thought Content: Thought content normal.        Judgment: Judgment normal.     Results for orders placed or performed during the hospital encounter of 09/14/19 (from the past 24 hour(s))  POCT urinalysis dip (device)     Status: Abnormal   Collection Time: 09/14/19  3:25 PM  Result Value Ref Range   Glucose, UA NEGATIVE NEGATIVE mg/dL   Bilirubin Urine NEGATIVE NEGATIVE   Ketones, ur NEGATIVE NEGATIVE mg/dL   Specific Gravity, Urine 1.020 1.005 - 1.030   Hgb urine dipstick NEGATIVE NEGATIVE   pH 7.0 5.0 - 8.0   Protein, ur NEGATIVE NEGATIVE mg/dL   Urobilinogen, UA 0.2 0.0 - 1.0 mg/dL   Nitrite NEGATIVE NEGATIVE   Leukocytes,Ua TRACE (A) NEGATIVE  POC urine pregnancy     Status: None   Collection Time: 09/14/19  3:31 PM  Result Value Ref Range   Preg Test, Ur NEGATIVE NEGATIVE    Assessment and Plan :   PDMP not reviewed this encounter.  1. Pelvic pain   2. Dysuria   3. Unprotected sex   4. Lower abdominal pain   5. Acute low back pain without sciatica, unspecified back pain laterality     Patient treated empirically as per CDC guidelines with IM ceftriaxone, azithromycin in clinic to assure compliance.  Labs including urine culture pending.   Counseled on safe sex practices including abstaining for 1 week following treatment.  Counseled patient on potential for adverse effects with medications prescribed/recommended today, ER and return-to-clinic precautions discussed, patient verbalized understanding.    Jaynee Eagles, PA-C 09/14/19 1600

## 2019-09-14 NOTE — Discharge Instructions (Addendum)
Avoid all forms of sexual intercourse (oral, vaginal, anal) for the next 7 days to avoid spreading/reinfecting. Return if symptoms worsen/do not resolve, you develop fever, abdominal pain, blood in your urine, or are re-exposed to an STI.

## 2019-09-14 NOTE — ED Triage Notes (Signed)
Pt c/o dysuria, strong urine odor, cloudy urine, pelvic and lower back painx2 days.

## 2019-09-16 ENCOUNTER — Telehealth (HOSPITAL_COMMUNITY): Payer: Self-pay | Admitting: Emergency Medicine

## 2019-09-16 LAB — URINE CULTURE: Culture: >=100000 — AB

## 2019-09-16 LAB — CERVICOVAGINAL ANCILLARY ONLY
Bacterial Vaginitis (gardnerella): POSITIVE — AB
Candida Glabrata: NEGATIVE
Candida Vaginitis: NEGATIVE
Chlamydia: NEGATIVE
Comment: NEGATIVE
Comment: NEGATIVE
Comment: NEGATIVE
Comment: NEGATIVE
Comment: NEGATIVE
Comment: NORMAL
Neisseria Gonorrhea: NEGATIVE
Trichomonas: NEGATIVE

## 2019-09-16 MED ORDER — NITROFURANTOIN MONOHYD MACRO 100 MG PO CAPS: 100.0000 mg | ORAL_CAPSULE | Freq: Two times a day (BID) | ORAL | 0 refills | Status: DC

## 2019-09-17 ENCOUNTER — Telehealth (HOSPITAL_COMMUNITY): Payer: Self-pay

## 2020-01-06 ENCOUNTER — Ambulatory Visit
Admission: EM | Admit: 2020-01-06 | Discharge: 2020-01-06 | Disposition: A | Discharge status: Home / Self Care | Payer: Medicaid Other | Attending: Emergency Medicine | Admitting: Emergency Medicine

## 2020-01-06 ENCOUNTER — Other Ambulatory Visit: Payer: Self-pay

## 2020-01-06 DIAGNOSIS — R6889 Other general symptoms and signs: Secondary | ICD-10-CM

## 2020-01-06 MED ORDER — OSELTAMIVIR PHOSPHATE 75 MG PO CAPS
75.0000 mg | ORAL_CAPSULE | Freq: Two times a day (BID) | ORAL | 0 refills | Status: AC
Start: 2020-01-06 — End: 2020-01-11

## 2020-01-06 NOTE — ED Provider Notes (Signed)
[facility]    CSN: [id number] Arrival date & time: 01/06/20  1619      History   Chief Complaint Chief Complaint  Patient presents with  . Generalized Body Aches  . Dizziness    HPI Kathleen Hamilton is a 24 y.o. female  Kathleen Hamilton is a 24 y.o. female who presents for evaluation of influenza like symptoms. Symptoms include chills, headache, myalgias and fever and have been present for 1 day. She has tried to alleviate the symptoms with acetaminophen with moderate relief. High risk factors for influenza complications: none.  The following portions of the patient's history were reviewed and updated as appropriate: allergies, current medications, past family history, past medical history, past social history, past surgical history and problem list.     Past Medical History:  Diagnosis Date  . Dysmenorrhea   . Endometriosis   . Heart murmur   . Ulcerative colitis [facility])     Patient Active Problem List   Diagnosis Date Noted  . Indication for care in labor or delivery 07/11/2018  . Gestational hypertension 07/11/2018  . Status post vacuum-assisted vaginal delivery 07/11/2018  . Postpartum care following vaginal delivery 5/27 07/11/2018  . Perineal laceration, second degree 07/11/2018  . VISUAL ACUITY, DECREASED 04/19/2010  . OVERWEIGHT 04/15/2010  . DYSMENORRHEA 04/15/2010  . MENORRHAGIA 04/15/2010    Past Surgical History:  Procedure Laterality Date  . BIOPSY BOWEL    . COLON BIOPSY     hx of colon problems.  ? mega colon, awaiting bx results  . NO PAST SURGERIES      OB History    Gravida  2   Para  1   Term  1   Preterm      AB  1   Living  1     SAB  1   TAB      Ectopic      Multiple  0   Live Births  1            Home Medications    Prior to Admission medications   Medication Sig Start Date End Date Taking? Authorizing Provider  acetaminophen (TYLENOL) 325 MG tablet Take 2 tablets (650 mg total) by mouth every 4  (four) hours as needed (for pain scale < 4). 07/13/18   Sigmon, Tyler Deis, CNM  aluminum-magnesium hydroxide-simethicone (MAALOX) 324-401-02 MG/5ML SUSP Take 30 mLs by mouth 4 (four) times daily -  before meals and at bedtime. 05/06/19   Khatri, Hina, PA-C  nitrofurantoin, macrocrystal-monohydrate, (MACROBID) 100 MG capsule Take 1 capsule (100 mg total) by mouth 2 (two) times daily. 09/16/19   Chase Picket, MD  ondansetron (ZOFRAN ODT) 4 MG disintegrating tablet Take 1 tablet (4 mg total) by mouth every 8 (eight) hours as needed for nausea or vomiting. 05/06/19   Khatri, Hina, PA-C  oseltamivir (TAMIFLU) 75 MG capsule Take 1 capsule (75 mg total) by mouth 2 (two) times daily for 5 days. 01/06/20 01/11/20  Hall-Potvin, [place], PA-C  Probiotic Product (PROBIOTIC-10 PO) Take by mouth.    [provider]  esomeprazole (NEXIUM) 20 MG capsule Take 1 capsule (20 mg total) by mouth daily before breakfast. Patient not taking: Reported on 05/06/2019 08/24/18 01/06/20  Jaynee Eagles, PA-C  famotidine (PEPCID) 20 MG tablet Take 1 tablet (20 mg total) by mouth 2 (two) times daily. Patient not taking: Reported on 08/27/2018 08/24/18 08/27/18  Jaynee Eagles, PA-C  hydrochlorothiazide (HYDRODIURIL) 25 MG tablet Take 1 tablet (25 mg  total) by mouth daily. Patient not taking: Reported on 08/27/2018 07/13/18 08/27/18  Darliss Cheney, CNM  iron polysaccharides (NIFEREX) 150 MG capsule Take 1 capsule (150 mg total) by mouth daily. Patient not taking: Reported on 08/27/2018 07/14/18 08/27/18  Darliss Cheney, CNM    Family History Family History  Problem Relation Age of Onset  . Diabetes Maternal Grandmother   . Hypertension Maternal Grandmother   . Heart disease Maternal Grandmother   . Breast cancer Maternal Grandmother        unsure age of onset  . Diabetes Paternal Grandmother   . Heart disease Paternal Grandmother   . Hypertension Paternal Grandmother   . Diabetes Paternal Grandfather   . Heart  disease Paternal Grandfather   . Hypertension Paternal Grandfather   . Healthy Mother   . Healthy Father     Social History Social History   Tobacco Use  . Smoking status: Never Smoker  . Smokeless tobacco: Never Used  Vaping Use  . Vaping Use: Never used  Substance Use Topics  . Alcohol use: No  . Drug use: No     Allergies   Aspirin, Fish allergy, Naproxen, Nsaids, and Shellfish allergy   Review of Systems Review of Systems  Constitutional: Positive for activity change, appetite change, chills, fatigue and fever.  HENT: Positive for congestion. Negative for dental problem, ear pain, facial swelling, hearing loss, sinus pain, sore throat, trouble swallowing and voice change.   Eyes: Negative for photophobia, pain, redness and visual disturbance.  Respiratory: Negative for cough and shortness of breath.   Cardiovascular: Negative for chest pain and palpitations.  Gastrointestinal: Negative for abdominal pain, diarrhea and vomiting.  Musculoskeletal: Positive for myalgias. Negative for arthralgias.  Skin: Negative for rash and wound.  Neurological: Positive for headaches. Negative for dizziness and syncope.     Physical Exam Triage Vital Signs ED Triage Vitals  Enc Vitals Group     BP 01/06/20 1721 122/81     Pulse Rate 01/06/20 1721 (!) 106     Resp 01/06/20 1721 18     Temp 01/06/20 1721 (!) 101.5 F (38.6 C)     Temp Source 01/06/20 1721 Oral     SpO2 01/06/20 1721 97 %     Weight --      Height --      Head Circumference --      Peak Flow --      Pain Score 01/06/20 1718 0     Pain Loc --      Pain Edu? --      Excl. in [place]? --    No data found.  Updated Vital Signs BP 122/81 (BP Location: Left Arm)   Pulse (!) 106   Temp (!) 101.5 F (38.6 C) (Oral)   Resp 18   LMP 12/31/2019 (Exact Date)   SpO2 97%   Breastfeeding No   Visual Acuity Right Eye Distance:   Left Eye Distance:   Bilateral Distance:    Right Eye Near:   Left Eye Near:      Bilateral Near:     Physical Exam Constitutional:      General: She is not in acute distress.    Appearance: She is ill-appearing. She is not toxic-appearing or diaphoretic.  HENT:     Head: Normocephalic and atraumatic.     Right Ear: Tympanic membrane and ear canal normal.     Left Ear: Tympanic membrane and ear canal normal.     Mouth/Throat:  Mouth: Mucous membranes are moist.     Pharynx: Oropharynx is clear. No oropharyngeal exudate or posterior oropharyngeal erythema.  Eyes:     General: No scleral icterus.    Pupils: Pupils are equal, round, and reactive to light.  Cardiovascular:     Rate and Rhythm: Normal rate and regular rhythm.  Pulmonary:     Effort: Pulmonary effort is normal. No respiratory distress.     Breath sounds: No wheezing.  Skin:    Coloration: Skin is not jaundiced or pale.  Neurological:     Mental Status: She is alert and oriented to person, place, and time.       UC Treatments / Results  Labs (all labs ordered are listed, but only abnormal results are displayed) Labs Reviewed  NOVEL CORONAVIRUS, NAA    EKG   Radiology No results found.  Procedures Procedures (including critical care time)  Medications Ordered in UC Medications - No data to display  Initial Impression / Assessment and Plan / UC Course  I have reviewed the triage vital signs and the nursing notes.  Pertinent labs & imaging results that were available during my care of the patient were reviewed by me and considered in my medical decision making (see chart for details).     Patient febrile, nontoxic, with SpO2 97%.  Covid PCR pending.  Patient to quarantine until results are back.  We will treat supportively as outlined below for suspected flu.  Return precautions discussed, patient verbalized understanding and is agreeable to plan. Final Clinical Impressions(s) / UC Diagnoses   Final diagnoses:  Flu-like symptoms     Discharge Instructions     Tamiflu 2  times a day for 5 days. Drink water! Your COVID test is pending - it is important to quarantine / isolate at home until your results are back. If you test positive and would like further evaluation for persistent or worsening symptoms, you may schedule an E-visit or virtual (video) visit throughout the [facility] app or website.  PLEASE NOTE: If you develop severe chest pain or shortness of breath please go to the ER or call [phone] for further evaluation --> DO NOT schedule electronic or virtual visits for this. Please call our office for further guidance / recommendations as needed.  For information about the Covid vaccine, please visit [email or web address]    ED Prescriptions    Medication Sig Dispense Auth. Provider   oseltamivir (TAMIFLU) 75 MG capsule Take 1 capsule (75 mg total) by mouth 2 (two) times daily for 5 days. 10 capsule Hall-Potvin, [place], PA-C     PDMP not reviewed this encounter.   Hall-Potvin, [place], [place] 01/06/20 1900

## 2020-01-06 NOTE — Discharge Instructions (Signed)
Tamiflu 2 times a day for 5 days. Drink water! Your COVID test is pending - it is important to quarantine / isolate at home until your results are back. If you test positive and would like further evaluation for persistent or worsening symptoms, you may schedule an E-visit or virtual (video) visit throughout the [facility] app or website.  PLEASE NOTE: If you develop severe chest pain or shortness of breath please go to the ER or call 9-1-1 for further evaluation --> DO NOT schedule electronic or virtual visits for this. Please call our office for further guidance / recommendations as needed.  For information about the Covid vaccine, please visit [email or web address]

## 2020-01-06 NOTE — ED Triage Notes (Signed)
Pt is here with dizziness and body aches that started last night, pt has taken Tylenol to relieve discomfort.

## 2020-01-07 LAB — SARS-COV-2, NAA 2 DAY TAT

## 2020-01-07 LAB — NOVEL CORONAVIRUS, NAA: SARS-CoV-2, NAA: NOT DETECTED

## 2020-02-21 ENCOUNTER — Ambulatory Visit
Admission: EM | Admit: 2020-02-21 | Discharge: 2020-02-21 | Disposition: A | Discharge status: Home / Self Care | Payer: 59 | Attending: Emergency Medicine | Admitting: Emergency Medicine

## 2020-02-21 ENCOUNTER — Encounter: Payer: Self-pay | Admitting: Emergency Medicine

## 2020-02-21 ENCOUNTER — Other Ambulatory Visit: Payer: Self-pay

## 2020-02-21 DIAGNOSIS — R1310 Dysphagia, unspecified: Secondary | ICD-10-CM

## 2020-02-21 MED ORDER — PREDNISONE 20 MG PO TABS: 40.0000 mg | ORAL_TABLET | Freq: Every day | ORAL | 0 refills | Status: AC

## 2020-02-21 NOTE — ED Triage Notes (Signed)
Patient c/o throat tightness x 2 days.   Patient denies pain.  Patient endorses "a lump in throat" upon onset of symptoms.  Patient endorses it's hard to swallow.   Patient is able to hold a full sentence without any abnormal pauses.   Patient has taken benadryl last night w/ some relief of symptoms.   Patient tested positive for COVID on 02/12/2020.

## 2020-02-21 NOTE — Discharge Instructions (Signed)
Begin prednisone daily for the next 5 days-take with food May try over-the-counter Pepcid twice daily for any underlying acid Warm compresses to swollen lymph nodes Follow-up if not improving or worsening

## 2020-02-21 NOTE — ED Provider Notes (Signed)
[facility]    CSN: [id number] Arrival date & time: 02/21/20  1558      History   Chief Complaint Chief Complaint  Patient presents with   Throat Tightness    HPI Kathleen Hamilton is a 25 y.o. female presenting today for evaluation of throat discomfort.  Reports that she has had tightness in her throat for approximately 2 days.  Reports it feels as if there is a lump in her throat and it is hard to swallow.  Took Benadryl last night with some mild relief.  Patient positive for Covid on 12/29.  Denies any specific soreness.  Denies any associated URI symptoms.  HPI  Past Medical History:  Diagnosis Date   Dysmenorrhea    Endometriosis    Heart murmur    Ulcerative colitis [facility])     Patient Active Problem List   Diagnosis Date Noted   Indication for care in labor or delivery 07/11/2018   Gestational hypertension 07/11/2018   Status post vacuum-assisted vaginal delivery 07/11/2018   Postpartum care following vaginal delivery 5/27 07/11/2018   Perineal laceration, second degree 07/11/2018   VISUAL ACUITY, DECREASED 04/19/2010   OVERWEIGHT 04/15/2010   DYSMENORRHEA 04/15/2010   MENORRHAGIA 04/15/2010    Past Surgical History:  Procedure Laterality Date   BIOPSY BOWEL     COLON BIOPSY     hx of colon problems.  ? mega colon, awaiting bx results   NO PAST SURGERIES      OB History    Gravida  2   Para  1   Term  1   Preterm      AB  1   Living  1     SAB  1   IAB      Ectopic      Multiple  0   Live Births  1            Home Medications    Prior to Admission medications   Medication Sig Start Date End Date Taking? Authorizing Provider  predniSONE (DELTASONE) 20 MG tablet Take 2 tablets (40 mg total) by mouth daily for 5 days. 02/21/20 02/26/20 Yes Syrina Wake C, PA-C  acetaminophen (TYLENOL) 325 MG tablet Take 2 tablets (650 mg total) by mouth every 4 (four) hours as needed (for pain scale < 4). 07/13/18    Sigmon, Tyler Deis, CNM  aluminum-magnesium hydroxide-simethicone (MAALOX) 536-644-03 MG/5ML SUSP Take 30 mLs by mouth 4 (four) times daily -  before meals and at bedtime. 05/06/19   Khatri, Hina, PA-C  nitrofurantoin, macrocrystal-monohydrate, (MACROBID) 100 MG capsule Take 1 capsule (100 mg total) by mouth 2 (two) times daily. 09/16/19   Chase Picket, MD  ondansetron (ZOFRAN ODT) 4 MG disintegrating tablet Take 1 tablet (4 mg total) by mouth every 8 (eight) hours as needed for nausea or vomiting. 05/06/19   Khatri, Hina, PA-C  Probiotic Product (PROBIOTIC-10 PO) Take by mouth.    [provider]  esomeprazole (NEXIUM) 20 MG capsule Take 1 capsule (20 mg total) by mouth daily before breakfast. Patient not taking: Reported on 05/06/2019 08/24/18 01/06/20  Jaynee Eagles, PA-C  famotidine (PEPCID) 20 MG tablet Take 1 tablet (20 mg total) by mouth 2 (two) times daily. Patient not taking: Reported on 08/27/2018 08/24/18 08/27/18  Jaynee Eagles, PA-C  hydrochlorothiazide (HYDRODIURIL) 25 MG tablet Take 1 tablet (25 mg total) by mouth daily. Patient not taking: Reported on 08/27/2018 07/13/18 08/27/18  Darliss Cheney, CNM  iron polysaccharides (NIFEREX)  150 MG capsule Take 1 capsule (150 mg total) by mouth daily. Patient not taking: Reported on 08/27/2018 07/14/18 08/27/18  Darliss Cheney, CNM    Family History Family History  Problem Relation Age of Onset   Diabetes Maternal Grandmother    Hypertension Maternal Grandmother    Heart disease Maternal Grandmother    Breast cancer Maternal Grandmother        unsure age of onset   Diabetes Paternal Grandmother    Heart disease Paternal Grandmother    Hypertension Paternal Grandmother    Diabetes Paternal Grandfather    Heart disease Paternal Grandfather    Hypertension Paternal Grandfather    Healthy Mother    Healthy Father     Social History Social History   Tobacco Use   Smoking status: Never Smoker   Smokeless  tobacco: Never Used  Scientific laboratory technician Use: Never used  Substance Use Topics   Alcohol use: No   Drug use: No     Allergies   Aspirin, Fish allergy, Naproxen, Nsaids, and Shellfish allergy   Review of Systems Review of Systems  Constitutional: Negative for activity change, appetite change, chills, fatigue and fever.  HENT: Positive for trouble swallowing. Negative for congestion, ear pain, rhinorrhea, sinus pressure and sore throat.   Eyes: Negative for discharge and redness.  Respiratory: Negative for cough, chest tightness and shortness of breath.   Cardiovascular: Negative for chest pain.  Gastrointestinal: Negative for abdominal pain, diarrhea, nausea and vomiting.  Musculoskeletal: Negative for myalgias.  Skin: Negative for rash.  Neurological: Negative for dizziness, light-headedness and headaches.     Physical Exam Triage Vital Signs ED Triage Vitals  Enc Vitals Group     BP 02/21/20 1823 115/72     Pulse Rate 02/21/20 1823 60     Resp 02/21/20 1823 14     Temp 02/21/20 1823 98.3 F (36.8 C)     Temp Source 02/21/20 1823 Oral     SpO2 02/21/20 1823 98 %     Weight --      Height --      Head Circumference --      Peak Flow --      Pain Score 02/21/20 1616 0     Pain Loc --      Pain Edu? --      Excl. in [place]? --    No data found.  Updated Vital Signs BP 115/72 (BP Location: Left Arm)    Pulse 60    Temp 98.3 F (36.8 C) (Oral)    Resp 14    LMP 02/20/2020    SpO2 98%   Visual Acuity Right Eye Distance:   Left Eye Distance:   Bilateral Distance:    Right Eye Near:   Left Eye Near:    Bilateral Near:     Physical Exam Vitals and nursing note reviewed.  Constitutional:      Appearance: She is well-developed and well-nourished.     Comments: No acute distress  HENT:     Head: Normocephalic and atraumatic.     Ears:     Comments: Bilateral ears without tenderness to palpation of external auricle, tragus and mastoid, EAC's without erythema or  swelling, TM's with good bony landmarks and cone of light. Non erythematous.     Nose: Nose normal.     Mouth/Throat:     Comments: Oral mucosa pink and moist, no tonsillar enlargement or exudate. Posterior pharynx patent and nonerythematous, no uvula  deviation or swelling. Normal phonation. Eyes:     Conjunctiva/sclera: Conjunctivae normal.  Neck:     Comments: Very mild tonsillar lymphadenopathy Cardiovascular:     Rate and Rhythm: Normal rate.  Pulmonary:     Effort: Pulmonary effort is normal. No respiratory distress.     Comments: Breathing comfortably at rest, CTABL, no wheezing, rales or other adventitious sounds auscultated  Abdominal:     General: There is no distension.  Musculoskeletal:        General: Normal range of motion.     Cervical back: Neck supple.  Skin:    General: Skin is warm and dry.  Neurological:     Mental Status: She is alert and oriented to person, place, and time.  Psychiatric:        Mood and Affect: Mood and affect normal.      UC Treatments / Results  Labs (all labs ordered are listed, but only abnormal results are displayed) Labs Reviewed - No data to display  EKG   Radiology No results found.  Procedures Procedures (including critical care time)  Medications Ordered in UC Medications - No data to display  Initial Impression / Assessment and Plan / UC Course  I have reviewed the triage vital signs and the nursing notes.  Pertinent labs & imaging results that were available during my care of the patient were reviewed by me and considered in my medical decision making (see chart for details).     Patient currently positive, exam reassuring, no signs of tonsillitis, peritonsillar abscess or deep space infection at this time, opted to provide a course of prednisone given patient's initial sensation felt as if she was having an allergic reaction, discussed possibility of possible underlying etiology or correlation to mildly swollen  lymph nodes.  Close monitoring,Discussed strict return precautions. Patient verbalized understanding and is agreeable with plan.  Final Clinical Impressions(s) / UC Diagnoses   Final diagnoses:  Dysphagia, unspecified type     Discharge Instructions     Begin prednisone daily for the next 5 days-take with food May try over-the-counter Pepcid twice daily for any underlying acid Warm compresses to swollen lymph nodes Follow-up if not improving or worsening    ED Prescriptions    Medication Sig Dispense Auth. Provider   predniSONE (DELTASONE) 20 MG tablet Take 2 tablets (40 mg total) by mouth daily for 5 days. 10 tablet Tydarius Yawn, [place] C, PA-C     PDMP not reviewed this encounter.   Janith Lima, [place] 02/22/20 [phone]

## 2020-04-07 ENCOUNTER — Encounter: Payer: Self-pay | Admitting: Emergency Medicine

## 2020-04-07 ENCOUNTER — Other Ambulatory Visit: Payer: Self-pay

## 2020-04-07 ENCOUNTER — Ambulatory Visit
Admission: EM | Admit: 2020-04-07 | Discharge: 2020-04-07 | Disposition: A | Discharge status: Home / Self Care | Payer: 59 | Attending: Family Medicine | Admitting: Family Medicine

## 2020-04-07 DIAGNOSIS — J0140 Acute pansinusitis, unspecified: Secondary | ICD-10-CM

## 2020-04-07 DIAGNOSIS — R0981 Nasal congestion: Secondary | ICD-10-CM

## 2020-04-07 DIAGNOSIS — H5789 Other specified disorders of eye and adnexa: Secondary | ICD-10-CM

## 2020-04-07 MED ORDER — AMOXICILLIN 875 MG PO TABS
875.0000 mg | ORAL_TABLET | Freq: Two times a day (BID) | ORAL | 0 refills | Status: DC
Start: 2020-04-07 — End: 2021-05-06

## 2020-04-07 MED ORDER — FLUTICASONE PROPIONATE 50 MCG/ACT NA SUSP
2.0000 | Freq: Every day | NASAL | 0 refills | Status: DC
Start: 2020-04-07 — End: 2021-05-06

## 2020-04-07 NOTE — ED Triage Notes (Signed)
Pt here for cold sx onset 2 weeks associated w/facial pressure  Denies f/v/n/d  Taking OTC allergy med w/no relief.

## 2020-04-07 NOTE — ED Provider Notes (Signed)
[facility]    CSN: [id number] Arrival date & time: 04/07/20  1827      History   Chief Complaint Chief Complaint  Patient presents with   URI    HPI Kathleen Hamilton is a 25 y.o. female.   HPI  Patient presents today with 2 weeks of left-sided facial pressure with left eye drainage.  She has been negative for fever.  She reports this is the same course in which her prior sinus infection presented.  She has been taken over-the-counter Benadryl and use an over-the-counter decongestant nasal spray without relief of symptoms.  She is afebrile.  Denies any cough or sore throat associated with her current symptoms.  Past Medical History:  Diagnosis Date   Dysmenorrhea    Endometriosis    Heart murmur    Ulcerative colitis [facility])     Patient Active Problem List   Diagnosis Date Noted   Indication for care in labor or delivery 07/11/2018   Gestational hypertension 07/11/2018   Status post vacuum-assisted vaginal delivery 07/11/2018   Postpartum care following vaginal delivery 5/27 07/11/2018   Perineal laceration, second degree 07/11/2018   VISUAL ACUITY, DECREASED 04/19/2010   OVERWEIGHT 04/15/2010   DYSMENORRHEA 04/15/2010   MENORRHAGIA 04/15/2010    Past Surgical History:  Procedure Laterality Date   BIOPSY BOWEL     COLON BIOPSY     hx of colon problems.  ? mega colon, awaiting bx results   NO PAST SURGERIES      OB History    Gravida  2   Para  1   Term  1   Preterm      AB  1   Living  1     SAB  1   IAB      Ectopic      Multiple  0   Live Births  1            Home Medications    Prior to Admission medications   Medication Sig Start Date End Date Taking? Authorizing Provider  amoxicillin (AMOXIL) 875 MG tablet Take 1 tablet (875 mg total) by mouth 2 (two) times daily. 04/07/20  Yes Scot Jun, FNP  fluticasone (FLONASE) 50 MCG/ACT nasal spray Place 2 sprays into both nostrils daily. 04/07/20  Yes  Scot Jun, FNP  acetaminophen (TYLENOL) 325 MG tablet Take 2 tablets (650 mg total) by mouth every 4 (four) hours as needed (for pain scale < 4). 07/13/18   Sigmon, Tyler Deis, CNM  aluminum-magnesium hydroxide-simethicone (MAALOX) 973-532-99 MG/5ML SUSP Take 30 mLs by mouth 4 (four) times daily -  before meals and at bedtime. 05/06/19   Khatri, Hina, PA-C  nitrofurantoin, macrocrystal-monohydrate, (MACROBID) 100 MG capsule Take 1 capsule (100 mg total) by mouth 2 (two) times daily. 09/16/19   Chase Picket, MD  ondansetron (ZOFRAN ODT) 4 MG disintegrating tablet Take 1 tablet (4 mg total) by mouth every 8 (eight) hours as needed for nausea or vomiting. 05/06/19   Khatri, Hina, PA-C  Probiotic Product (PROBIOTIC-10 PO) Take by mouth.    [provider]  esomeprazole (NEXIUM) 20 MG capsule Take 1 capsule (20 mg total) by mouth daily before breakfast. Patient not taking: Reported on 05/06/2019 08/24/18 01/06/20  Jaynee Eagles, PA-C  famotidine (PEPCID) 20 MG tablet Take 1 tablet (20 mg total) by mouth 2 (two) times daily. Patient not taking: Reported on 08/27/2018 08/24/18 08/27/18  Jaynee Eagles, PA-C  hydrochlorothiazide (HYDRODIURIL) 25 MG tablet Take  1 tablet (25 mg total) by mouth daily. Patient not taking: Reported on 08/27/2018 07/13/18 08/27/18  Darliss Cheney, CNM  iron polysaccharides (NIFEREX) 150 MG capsule Take 1 capsule (150 mg total) by mouth daily. Patient not taking: Reported on 08/27/2018 07/14/18 08/27/18  Darliss Cheney, CNM    Family History Family History  Problem Relation Age of Onset   Diabetes Maternal Grandmother    Hypertension Maternal Grandmother    Heart disease Maternal Grandmother    Breast cancer Maternal Grandmother        unsure age of onset   Diabetes Paternal Grandmother    Heart disease Paternal Grandmother    Hypertension Paternal Grandmother    Diabetes Paternal Grandfather    Heart disease Paternal Grandfather    Hypertension  Paternal Grandfather    Healthy Mother    Healthy Father     Social History Social History   Tobacco Use   Smoking status: Never Smoker   Smokeless tobacco: Never Used  Scientific laboratory technician Use: Never used  Substance Use Topics   Alcohol use: No   Drug use: No     Allergies   Aspirin, Fish allergy, Naproxen, Nsaids, and Shellfish allergy   Review of Systems Review of Systems Pertinent negatives listed in HPI  Physical Exam Triage Vital Signs ED Triage Vitals  Enc Vitals Group     BP 04/07/20 2012 109/73     Pulse Rate 04/07/20 2012 73     Resp 04/07/20 2012 18     Temp 04/07/20 2012 98.5 F (36.9 C)     Temp Source 04/07/20 2012 Oral     SpO2 04/07/20 2012 98 %     Weight --      Height --      Head Circumference --      Peak Flow --      Pain Score 04/07/20 2013 7     Pain Loc --      Pain Edu? --      Excl. in [place]? --    No data found.  Updated Vital Signs BP 109/73 (BP Location: Left Arm)    Pulse 73    Temp 98.5 F (36.9 C) (Oral)    Resp 18    LMP 02/15/2020    SpO2 98%    Breastfeeding No   Visual Acuity Right Eye Distance:   Left Eye Distance:   Bilateral Distance:    Right Eye Near:   Left Eye Near:    Bilateral Near:     Physical Exam  General Appearance:    Alert, cooperative, no distress  HENT:   Normocephalic, ears normal, nares mucosal edema with congestion, rhinorrhea, oropharynx clear  Eyes:    PERRL, left eye exudative drainage present , EOM's intact       Lungs:     Clear to auscultation bilaterally, respirations unlabored  Heart:    Regular rate and rhythm  Neurologic:   Awake, alert, oriented x 3. No apparent focal neurological           defect.      UC Treatments / Results  Labs (all labs ordered are listed, but only abnormal results are displayed) Labs Reviewed - No data to display  EKG   Radiology No results found.  Procedures Procedures (including critical care time)  Medications Ordered in  UC Medications - No data to display  Initial Impression / Assessment and Plan / UC Course  I have reviewed the  triage vital signs and the nursing notes.  Pertinent labs & imaging results that were available during my care of the patient were reviewed by me and considered in my medical decision making (see chart for details).     Acute sinusitis with associated nasal congestion drainage covering with amoxicillin 875 twice daily x10 days.  Advised to start Flonase and continue daily for management of nasal symptoms.  Follow-up with primary care provider if symptoms recur.  Symptoms present greater than 2 weeks therefore no concern for Covid. Final Clinical Impressions(s) / UC Diagnoses   Final diagnoses:  Acute non-recurrent pansinusitis  Nasal congestion  Eye drainage   Discharge Instructions   None    ED Prescriptions    Medication Sig Dispense Auth. Provider   amoxicillin (AMOXIL) 875 MG tablet Take 1 tablet (875 mg total) by mouth 2 (two) times daily. 20 tablet Scot Jun, FNP   fluticasone (FLONASE) 50 MCG/ACT nasal spray Place 2 sprays into both nostrils daily. 16 g Scot Jun, FNP     PDMP not reviewed this encounter.   Scot Jun, [place] 04/07/20 2051

## 2020-04-27 ENCOUNTER — Other Ambulatory Visit: Payer: Self-pay | Admitting: Physician Assistant

## 2020-04-27 DIAGNOSIS — R1030 Lower abdominal pain, unspecified: Secondary | ICD-10-CM

## 2020-05-01 ENCOUNTER — Other Ambulatory Visit: Payer: 59

## 2020-05-14 ENCOUNTER — Ambulatory Visit
Admission: RE | Admit: 2020-05-14 | Discharge: 2020-05-14 | Disposition: A | Discharge status: Home / Self Care | Payer: 59 | Source: Ambulatory Visit | Attending: Physician Assistant | Admitting: Physician Assistant

## 2020-05-14 DIAGNOSIS — R1030 Lower abdominal pain, unspecified: Secondary | ICD-10-CM

## 2020-05-14 MED ORDER — IOPAMIDOL (ISOVUE-300) INJECTION 61%
100.0000 mL | Freq: Once | INTRAVENOUS | Status: AC | PRN
  Administered 2020-05-14: 100 mL via INTRAVENOUS

## 2020-06-02 ENCOUNTER — Emergency Department (HOSPITAL_COMMUNITY)
Admission: EM | Admit: 2020-06-02 | Discharge: 2020-06-02 | Disposition: A | Discharge status: Home / Self Care | Payer: 59 | Attending: Emergency Medicine | Admitting: Emergency Medicine

## 2020-06-02 ENCOUNTER — Encounter (HOSPITAL_COMMUNITY): Payer: Self-pay | Admitting: Emergency Medicine

## 2020-06-02 DIAGNOSIS — R197 Diarrhea, unspecified: Secondary | ICD-10-CM

## 2020-06-02 DIAGNOSIS — R112 Nausea with vomiting, unspecified: Secondary | ICD-10-CM

## 2020-06-02 DIAGNOSIS — K51919 Ulcerative colitis, unspecified with unspecified complications: Secondary | ICD-10-CM | POA: Diagnosis not present

## 2020-06-02 LAB — I-STAT BETA HCG BLOOD, ED (MC, WL, AP ONLY): I-stat hCG, quantitative: <5 m[IU]/mL (ref <5)

## 2020-06-02 LAB — URINALYSIS, ROUTINE W REFLEX MICROSCOPIC
Bilirubin Urine: NEGATIVE
Glucose, UA: NEGATIVE mg/dL
Ketones, ur: NEGATIVE mg/dL
Nitrite: NEGATIVE
Protein, ur: 100 mg/dL — AB
RBC / HPF: >50 RBC/hpf — ABNORMAL HIGH (ref 0–5)
Specific Gravity, Urine: 1.014 (ref 1.005–1.030)
pH: 6 (ref 5.0–8.0)

## 2020-06-02 LAB — COMPREHENSIVE METABOLIC PANEL
ALT: 19 U/L (ref 0–44)
AST: 19 U/L (ref 15–41)
Albumin: 4.2 g/dL (ref 3.5–5.0)
Alkaline Phosphatase: 48 U/L (ref 38–126)
Anion gap: 8 (ref 5–15)
BUN: 16 mg/dL (ref 6–20)
CO2: 21 mmol/L — ABNORMAL LOW (ref 22–32)
Calcium: 9.1 mg/dL (ref 8.9–10.3)
Chloride: 110 mmol/L (ref 98–111)
Creatinine, Ser: 0.44 mg/dL (ref 0.44–1.00)
GFR, Estimated: >60 mL/min (ref >60)
Glucose, Bld: 115 mg/dL — ABNORMAL HIGH (ref 70–99)
Potassium: 4.2 mmol/L (ref 3.5–5.1)
Sodium: 139 mmol/L (ref 135–145)
Total Bilirubin: 0.8 mg/dL (ref 0.3–1.2)
Total Protein: 8.6 g/dL — ABNORMAL HIGH (ref 6.5–8.1)

## 2020-06-02 LAB — CBC
HCT: 43.1 % (ref 36.0–46.0)
Hemoglobin: 14.8 g/dL (ref 12.0–15.0)
MCH: 31 pg (ref 26.0–34.0)
MCHC: 34.3 g/dL (ref 30.0–36.0)
MCV: 90.2 fL (ref 80.0–100.0)
Platelets: 464 10*3/uL — ABNORMAL HIGH (ref 150–400)
RBC: 4.78 MIL/uL (ref 3.87–5.11)
RDW: 13.2 % (ref 11.5–15.5)
WBC: 13.7 10*3/uL — ABNORMAL HIGH (ref 4.0–10.5)
nRBC: 0 % (ref 0.0–0.2)

## 2020-06-02 LAB — LIPASE, BLOOD: Lipase: 28 U/L (ref 11–51)

## 2020-06-02 LAB — C DIFFICILE QUICK SCREEN W PCR REFLEX
C Diff antigen: NEGATIVE
C Diff interpretation: NOT DETECTED
C Diff toxin: NEGATIVE

## 2020-06-02 MED ORDER — SODIUM CHLORIDE 0.9 % IV BOLUS
1000.0000 mL | Freq: Once | INTRAVENOUS | Status: AC
  Administered 2020-06-02: 1000 mL via INTRAVENOUS

## 2020-06-02 MED ORDER — ONDANSETRON HCL 4 MG/2ML IJ SOLN
4.0000 mg | Freq: Once | INTRAMUSCULAR | Status: AC
  Administered 2020-06-02: 4 mg via INTRAVENOUS
  Filled 2020-06-02: qty 2

## 2020-06-02 MED ORDER — ONDANSETRON 4 MG PO TBDP: 4.0000 mg | ORAL_TABLET | Freq: Three times a day (TID) | ORAL | 0 refills | Status: DC | PRN

## 2020-06-02 NOTE — ED Provider Notes (Signed)
[facility] DEPT Provider Note   CSN: [id number] Arrival date & time: 06/02/20  5027     History Chief Complaint  Patient presents with  . Emesis  . Abdominal Pain  . Nausea  . Diarrhea    Kathleen Hamilton is a 25 y.o. female.  History of ulcerative colitis and is on medication.  No known cause of her symptoms, and she states that it does not feel exactly like a flare of her ulcerative colitis.  No food sources or recent antibiotics.  She did see scant blood when she wiped, but there was not blood in the toilet.  The history is provided by the patient.  Emesis Severity:  Severe Duration:  8 hours Timing:  Intermittent Emesis appearance: "like someone put water in milk" Progression:  Unchanged Chronicity:  New Recent urination:  Decreased Relieved by:  Nothing Worsened by:  Nothing Associated symptoms: abdominal pain and diarrhea   Associated symptoms: no arthralgias, no chills, no cough, no fever and no sore throat   Diarrhea:    Quality:  Copious   Severity:  Severe   Duration:  8 hours   Timing:  Constant   Progression:  Unchanged Abdominal Pain Associated symptoms: diarrhea and vomiting   Associated symptoms: no chest pain, no chills, no cough, no dysuria, no fever, no hematuria, no shortness of breath and no sore throat   Diarrhea Associated symptoms: abdominal pain and vomiting   Associated symptoms: no arthralgias, no chills and no fever        Past Medical History:  Diagnosis Date  . Dysmenorrhea   . Endometriosis   . Heart murmur   . Ulcerative colitis [facility])     Patient Active Problem List   Diagnosis Date Noted  . Indication for care in labor or delivery 07/11/2018  . Gestational hypertension 07/11/2018  . Status post vacuum-assisted vaginal delivery 07/11/2018  . Postpartum care following vaginal delivery 5/27 07/11/2018  . Perineal laceration, second degree 07/11/2018  . VISUAL ACUITY, DECREASED 04/19/2010  .  OVERWEIGHT 04/15/2010  . DYSMENORRHEA 04/15/2010  . MENORRHAGIA 04/15/2010    Past Surgical History:  Procedure Laterality Date  . BIOPSY BOWEL    . COLON BIOPSY     hx of colon problems.  ? mega colon, awaiting bx results  . NO PAST SURGERIES       OB History    Gravida  2   Para  1   Term  1   Preterm      AB  1   Living  1     SAB  1   IAB      Ectopic      Multiple  0   Live Births  1           Family History  Problem Relation Age of Onset  . Diabetes Maternal Grandmother   . Hypertension Maternal Grandmother   . Heart disease Maternal Grandmother   . Breast cancer Maternal Grandmother        unsure age of onset  . Diabetes Paternal Grandmother   . Heart disease Paternal Grandmother   . Hypertension Paternal Grandmother   . Diabetes Paternal Grandfather   . Heart disease Paternal Grandfather   . Hypertension Paternal Grandfather   . Healthy Mother   . Healthy Father     Social History   Tobacco Use  . Smoking status: Never Smoker  . Smokeless tobacco: Never Used  Vaping Use  . Vaping Use:  Never used  Substance Use Topics  . Alcohol use: No  . Drug use: No    Home Medications Prior to Admission medications   Medication Sig Start Date End Date Taking? Authorizing Provider  acetaminophen (TYLENOL) 325 MG tablet Take 2 tablets (650 mg total) by mouth every 4 (four) hours as needed (for pain scale < 4). 07/13/18   Sigmon, Tyler Deis, CNM  aluminum-magnesium hydroxide-simethicone (MAALOX) 767-341-93 MG/5ML SUSP Take 30 mLs by mouth 4 (four) times daily -  before meals and at bedtime. 05/06/19   Khatri, Hina, PA-C  amoxicillin (AMOXIL) 875 MG tablet Take 1 tablet (875 mg total) by mouth 2 (two) times daily. 04/07/20   Scot Jun, FNP  fluticasone (FLONASE) 50 MCG/ACT nasal spray Place 2 sprays into both nostrils daily. 04/07/20   Scot Jun, FNP  nitrofurantoin, macrocrystal-monohydrate, (MACROBID) 100 MG capsule Take 1 capsule  (100 mg total) by mouth 2 (two) times daily. 09/16/19   Chase Picket, MD  ondansetron (ZOFRAN ODT) 4 MG disintegrating tablet Take 1 tablet (4 mg total) by mouth every 8 (eight) hours as needed for nausea or vomiting. 05/06/19   Khatri, Hina, PA-C  Probiotic Product (PROBIOTIC-10 PO) Take by mouth.    [provider]  esomeprazole (NEXIUM) 20 MG capsule Take 1 capsule (20 mg total) by mouth daily before breakfast. Patient not taking: Reported on 05/06/2019 08/24/18 01/06/20  Jaynee Eagles, PA-C  famotidine (PEPCID) 20 MG tablet Take 1 tablet (20 mg total) by mouth 2 (two) times daily. Patient not taking: Reported on 08/27/2018 08/24/18 08/27/18  Jaynee Eagles, PA-C  hydrochlorothiazide (HYDRODIURIL) 25 MG tablet Take 1 tablet (25 mg total) by mouth daily. Patient not taking: Reported on 08/27/2018 07/13/18 08/27/18  Darliss Cheney, CNM  iron polysaccharides (NIFEREX) 150 MG capsule Take 1 capsule (150 mg total) by mouth daily. Patient not taking: Reported on 08/27/2018 07/14/18 08/27/18  Darliss Cheney, CNM    Allergies    Aspirin, Fish allergy, Naproxen, Nsaids, and Shellfish allergy  Review of Systems   Review of Systems  Constitutional: Negative for chills and fever.  HENT: Negative for ear pain and sore throat.   Eyes: Negative for pain and visual disturbance.  Respiratory: Negative for cough and shortness of breath.   Cardiovascular: Negative for chest pain and palpitations.  Gastrointestinal: Positive for abdominal pain, diarrhea and vomiting.  Genitourinary: Negative for dysuria and hematuria.  Musculoskeletal: Negative for arthralgias and back pain.  Skin: Negative for color change and rash.  Neurological: Negative for seizures and syncope.  All other systems reviewed and are negative.   Physical Exam Updated Vital Signs BP 131/77 (BP Location: Left Arm)   Pulse 90   Temp 98.5 F (36.9 C) (Oral)   Resp 16   SpO2 99%   Physical Exam Vitals and nursing note  reviewed.  HENT:     Head: Normocephalic and atraumatic.  Eyes:     General: No scleral icterus. Pulmonary:     Effort: Pulmonary effort is normal. No respiratory distress.  Abdominal:     Palpations: Abdomen is soft.     Tenderness: There is no abdominal tenderness.  Musculoskeletal:     Cervical back: Normal range of motion.  Skin:    General: Skin is warm and dry.  Neurological:     General: No focal deficit present.     Mental Status: She is alert.  Psychiatric:        Mood and Affect: Mood normal.  ED Results / Procedures / Treatments   Labs (all labs ordered are listed, but only abnormal results are displayed) Labs Reviewed  COMPREHENSIVE METABOLIC PANEL - Abnormal; Notable for the following components:      Result Value   CO2 21 (*)    Glucose, Bld 115 (*)    Total Protein 8.6 (*)    All other components within normal limits  CBC - Abnormal; Notable for the following components:   WBC 13.7 (*)    Platelets 464 (*)    All other components within normal limits  URINALYSIS, ROUTINE W REFLEX MICROSCOPIC - Abnormal; Notable for the following components:   Color, Urine RED (*)    APPearance CLOUDY (*)    Hgb urine dipstick LARGE (*)    Protein, ur 100 (*)    Leukocytes,Ua SMALL (*)    RBC / HPF >50 (*)    Bacteria, UA FEW (*)    All other components within normal limits  C DIFFICILE QUICK SCREEN W PCR REFLEX  GASTROINTESTINAL PANEL BY PCR, STOOL (REPLACES STOOL CULTURE)  LIPASE, BLOOD  I-STAT BETA HCG BLOOD, ED ([facility], [facility], [facility] ONLY)    EKG None  Radiology No results found.  Procedures Procedures   Medications Ordered in ED Medications  sodium chloride 0.9 % bolus 1,000 mL (has no administration in time range)  ondansetron (ZOFRAN) injection 4 mg (has no administration in time range)    ED Course  I have reviewed the triage vital signs and the nursing notes.  Pertinent labs & imaging results that were available during my care of the patient were  reviewed by me and considered in my medical decision making (see chart for details).    MDM Rules/Calculators/A&P                          Kathleen Hamilton has a history of ulcerative colitis and presents with nausea, vomiting, and diarrhea that seems most likely consistent with an infectious source rather than an ulcerative colitis flare.  She was given IV hydration.  I did check her for an infectious diarrhea, and her C. difficile was back at discharge.  It was negative.  Bio Restaurant manager, fast food is pending.  She was feeling symptomatically better and was able to tolerate fluids at discharge.  Her abdominal exam was soft, nontender, and not suggestive of a serious intra-abdominal pathology.  Therefore imaging was deferred at this time. Final Clinical Impression(s) / ED Diagnoses Final diagnoses:  Nausea vomiting and diarrhea  Ulcerative colitis with complication, unspecified location [facility])    Rx / DC Orders ED Discharge Orders         Ordered    ondansetron (ZOFRAN ODT) 4 MG disintegrating tablet  Every 8 hours PRN        06/02/20 1416           Arnaldo Natal, MD 06/02/20 1417

## 2020-06-02 NOTE — ED Triage Notes (Signed)
Patient here from reporting nausea, vomiting, diarrhea, and abd pain since 2am this morning. Hx of ulcerative colitis.

## 2020-06-04 LAB — URINE CULTURE: Culture: 60000 — AB

## 2020-06-05 ENCOUNTER — Telehealth: Payer: Self-pay | Admitting: Emergency Medicine

## 2020-06-05 LAB — GASTROINTESTINAL PANEL BY PCR, STOOL (REPLACES STOOL CULTURE)

## 2020-06-05 NOTE — Telephone Encounter (Signed)
Post ED Visit - Positive Culture Follow-up  Culture report reviewed by antimicrobial stewardship pharmacist: [place] Team []  Nathan Batchelder, Pharm.D. []  [address], Pharm.D., BCPS AQ-ID []  Heide Guile, Pharm.D., BCPS []  Parks Neptune, Pharm.D., BCPS []  [place], Pharm.D., BCPS, AAHIVP []  [place], Pharm.D., BCPS, AAHIVP []  Legrand Como, PharmD, BCPS []  Salome Arnt, PharmD, BCPS []  Johnnette Gourd, PharmD, BCPS []  Hughes Better, PharmD []  Leeroy Cha, PharmD, BCPS []  Laqueta Linden, PharmD  [place] Team []  [address], PharmD []  Leodis Sias, PharmD []  Lindell Spar, PharmD []  Royetta Asal, Rph []  Graylin Shiver) Rema Fendt, PharmD []  Glennon Mac, PharmD []  Arlyn Dunning, PharmD []  Netta Cedars, PharmD []  Dia Sitter, PharmD []  Leone Haven, PharmD []  Gretta Arab, PharmD []  Theodis Shove, PharmD []  Peggyann Juba, PharmD   Positive urine culture Treated with none, asymptomatic, no further patient follow-up is required at this time.  Reuel Boom 06/05/2020, 9:06 AM

## 2020-06-09 ENCOUNTER — Other Ambulatory Visit: Payer: Self-pay | Admitting: Physician Assistant

## 2020-06-09 ENCOUNTER — Other Ambulatory Visit (HOSPITAL_COMMUNITY): Payer: Self-pay | Admitting: Physician Assistant

## 2020-06-09 DIAGNOSIS — R1013 Epigastric pain: Secondary | ICD-10-CM

## 2020-06-09 DIAGNOSIS — R112 Nausea with vomiting, unspecified: Secondary | ICD-10-CM

## 2020-07-03 ENCOUNTER — Other Ambulatory Visit: Payer: Self-pay

## 2020-07-03 ENCOUNTER — Encounter (HOSPITAL_COMMUNITY)
Admission: RE | Admit: 2020-07-03 | Discharge: 2020-07-03 | Disposition: A | Discharge status: Home / Self Care | Payer: 59 | Source: Ambulatory Visit | Attending: Physician Assistant | Admitting: Physician Assistant

## 2020-07-03 DIAGNOSIS — R1013 Epigastric pain: Secondary | ICD-10-CM | POA: Diagnosis not present

## 2020-07-03 DIAGNOSIS — R112 Nausea with vomiting, unspecified: Secondary | ICD-10-CM | POA: Diagnosis present

## 2020-07-03 MED ORDER — TECHNETIUM TC 99M MEBROFENIN IV KIT
5.0000 | PACK | Freq: Once | INTRAVENOUS | Status: AC | PRN
  Administered 2020-07-03: 5 via INTRAVENOUS

## 2020-07-22 ENCOUNTER — Encounter: Payer: 59 | Attending: Physician Assistant | Admitting: Dietician

## 2020-07-22 ENCOUNTER — Other Ambulatory Visit: Payer: Self-pay

## 2020-07-22 ENCOUNTER — Encounter: Payer: Self-pay | Admitting: Dietician

## 2020-07-22 VITALS — Ht 65.0 in | Wt 230.7 lb

## 2020-07-22 DIAGNOSIS — K51919 Ulcerative colitis, unspecified with unspecified complications: Secondary | ICD-10-CM | POA: Insufficient documentation

## 2020-07-22 NOTE — Progress Notes (Signed)
Medical Nutrition Therapy  Appointment Start time:  64  Appointment End time:  3646  Primary concerns today: Ulcerative colitis Referral diagnosis: K51.90 - Ulcerative colitis Preferred learning style: No preference indicated Learning readiness: Ready   NUTRITION ASSESSMENT   Anthropometrics  Ht: 5'5" Wt: 230.7 lbs Body mass index is 38.39 kg/m.  Clinical Medical Hx: Ulcerative Colitis (since 2015) Medications: Colazal, Prilosec, Zofran Labs: CO2 - 21 (low), Glucose - 115 (high) Notable Signs/Symptoms: Bloating, diarrhea  Lifestyle & Dietary Hx Pt reports losing weight since the birth of their daughter 2 years ago, pt was about 260 pounds and is down around 230 currently. Pt reports not being able to a lot of foods they used to be eat due to severe stomach pain, nausea, diarrhea. Pt states certain foods they used to eat (egg, avocado) with no issues now cause GI distress. Pt states the foods that give them symptoms have expanded since weight loss began.  Pt reports fear of eating due to potential symptoms of UC. Pt reports mother and brother have both had bowel resections due to UC. Pt reports denial of disease from the age of 50, until they were 9. Symptoms were present, pt states they hid them. Pt recently had norovirus, had severe vomiting for 2 days, lost 10 pounds. Was given IV fluids and is feeling much better. Pt reports being hard on themselves, working with a therapist. Pt has developed good stress coping techniques. Pt has been social media free since August, found it was damaging to their self-image.  Pt reports previously counting calories but noticed it took over their life and led to a potential disordered eating pattern. Pt reports they will go long periods of time without eating, may go a whole day at times without eating. Pt states they will be "stuck in their head" at times, or crave a particular food that they can't eat due to symptoms, and will not eat anything else  because it won't satisfy their hunger. Pt likes getting manicures, going to [employer], going to the gym, going on drives, swimming, hiking, spending time with daughter, action and comedy movies, and sleep.  Foods that cause symptoms: Eggs, avocado, cheese, bell peppers, some beans, spicy foods, whey protein. Pt has a moderate fish allergy, face will swell. Pt reports diarrhea from dairy products.   Estimated daily fluid intake: 128 oz, ~100 oz water Supplements: N/A Stress / self-care: Depends on circumstances, works with therapist Current average weekly physical activity: ADLs, goes to the gym 3-4 x a week (cardio/resistance) 45-60 mins  24-Hr Dietary Recall First Meal: 24 oz water, grande vanilla bean frappaciuno Snack: Apple juice, Ruffles chips Second Meal: Bojangle's mashed potatoes Snack: water Third Meal: none Snack: 1 bite of pizza Beverages: water, frappaciuno, apple juice   NUTRITION DIAGNOSIS  NB-1.7 Undesireable food choices As related to ulcerative colitis.  As evidenced by recurring bouts of N/V/D, inconsistent fiber intake, and irregular dietary pattern that includes long periods of fasting and skipping meals.   NUTRITION INTERVENTION  Nutrition education (E-1) on the following topics:  . Educated patient on the importance of eating small, frequent meals. Recommended eating 5-6 times daily. Educated patient on the proper fiber content associated with symptoms of ulcerative colitis. Regular fiber intake when asymptomatic, low fiber intake when symptoms arise. Recommended patient continue adequate fluid intake, especially during bouts of emesis or diarrhea. Recommended patient restart taking their daily probiotic supplement, as well as begin taking a pre-natal multivitamin. Educated patient on the low FODMAP  dietary approach to minimize lower GI symptoms. Recommended that patient log all foods eaten and symptoms that result to begin an elimination diet. Educated patient on the  relationship between emotional eating and the development of selective eating. Advised patient to work on viewing food as their source of nutrition, not fulfillment.   Handouts Provided Include   Inflammatory Bowel Disease Nutrition  Therapy Nutrition Care Manual  Low FODMAP Nutrition Therapy Nutrition Care Manual  Learning Style & Readiness for Change Teaching method utilized: Visual & Auditory  Demonstrated degree of understanding via: Teach Back  Barriers to learning/adherence to lifestyle change: None  Goals Established by Pt  Restart your probiotic supplement.  Begin taking a prenatal multivitamin. Try Petra Kuba Made "Prenatal + DHA" or any other "[employer] Labs" verified supplement.  Work on eating 5-6 small meals throughout the day. Start your day with a small breakfast. It can be a protein shake and fruit.  Pea protein supplements  Log your foods and symptoms. Write down what you ate, the time that you ate it, and ANY symptoms that may result.   DO NOT log calories.  Compare your food log the food list on your Low FODMAP Nutrition Therapy handout. If you identify a particular group of food on that list that consistently cause symptoms, eliminate those foods from your diet and see if symptoms improve. Marland Kitchen    MONITORING & EVALUATION Dietary intake, weekly physical activity, and food log/symptoms in 1 month.  Next Steps  Patient is to log foods and follow up with RD.

## 2020-07-22 NOTE — Patient Instructions (Addendum)
Restart your probiotic supplement.  Begin taking a prenatal multivitamin. Try Petra Kuba Made "Prenatal + DHA" or any other "[employer]" verified supplement.  Work on eating 5-6 small meals throughout the day. Start your day with a small breakfast. It can be a protein shake and fruit. Pea protein supplements  Log your foods and symptoms. Write down what you ate, the time that you ate it, and ANY symptoms that may result.  DO NOT log calories.  Compare your food log the food list on your Low FODMAP Nutrition Therapy handout. If you identify a particular group of food on that list that consistently cause symptoms, eliminate those foods from your diet and see if symptoms improve.

## 2020-08-13 DIAGNOSIS — F4323 Adjustment disorder with mixed anxiety and depressed mood: Secondary | ICD-10-CM | POA: Diagnosis not present

## 2020-08-18 NOTE — Progress Notes (Unsigned)
Patient has appt with Dr Burt Knack

## 2020-09-01 ENCOUNTER — Encounter: Payer: Self-pay | Admitting: Dietician

## 2020-09-01 ENCOUNTER — Encounter: Payer: 59 | Attending: Physician Assistant | Admitting: Dietician

## 2020-09-01 ENCOUNTER — Other Ambulatory Visit: Payer: Self-pay

## 2020-09-01 DIAGNOSIS — K5190 Ulcerative colitis, unspecified, without complications: Secondary | ICD-10-CM | POA: Insufficient documentation

## 2020-09-01 NOTE — Progress Notes (Signed)
Medical Nutrition Therapy  Appointment Start time:  0800  Appointment End time:  [phone]  Primary concerns today: Ulcerative colitis Referral diagnosis: K51.90 - Ulcerative colitis Preferred learning style: No preference indicated Learning readiness: Ready   NUTRITION ASSESSMENT   Anthropometrics  Ht: 5'5" Wt: 230.7 lbs There is no height or weight on file to calculate BMI.  Clinical Medical Hx: Ulcerative Colitis (since 2015) Medications: Colazal, Prilosec, Zofran Labs: CO2 - 21 (low), Glucose - 115 (high) Notable Signs/Symptoms: Bloating, diarrhea  Lifestyle & Dietary Hx Pt reports having no flare-ups since our last visit. Pt states they enjoy paying attention to what they eat and how it affects their GI symptoms now. Pt reports tracking foods and noticed greasy foods caused bloating and painful gas. This includes high fat dairy, which causes diarrhea. Pt can tolerate fat-free dairy with no symptoms. Pt reports acidic foods like tomatoes cause GERD. Pt reports restarting their probiotic and has seen improvements in bowel regularity. Pt reports starting a prenatal multivitamin as well. Pt reports still struggling to eat meals regularly. Pt states they don't make time for themselves to eat. Pt reports making protein coffee in the mornings with Evolve plant based protein. Pt is trying to snack on fruit more often. Pt is still going to the gym 3 to 4 times a week in the mornings.  Foods that cause symptoms: Eggs, avocado, cheese, bell peppers, some beans, spicy foods (GERD), whey protein. Pt has a moderate fish allergy, face will swell. Pt reports diarrhea from full fat dairy products, no GI symptoms from fat-free dairy.   Estimated daily fluid intake: 128 oz, ~100 oz water Supplements: N/A Stress / self-care: Depends on circumstances, works with therapist Current average weekly physical activity: ADLs, goes to the gym 3-4 x a week (cardio/resistance) 45-60 mins   24-Hr Dietary  Recall First Meal: Celsius energy drink, water, strawberry banana smoothie Snack:  Second Meal: Chicken, rice Snack:  Third Meal:  Snack:  Beverages: Water, energy drink   NUTRITION DIAGNOSIS  NB-1.7 Undesireable food choices As related to ulcerative colitis.  As evidenced by recurring bouts of N/V/D, inconsistent fiber intake, and irregular dietary pattern that includes long periods of fasting and skipping meals.   NUTRITION INTERVENTION  Nutrition education (E-1) on the following topics:  Educated patient on the importance of eating small, frequent meals. Recommended eating 5-6 times daily. Educated patient on the proper fiber content associated with symptoms of ulcerative colitis. Regular fiber intake when asymptomatic, low fiber intake when symptoms arise. Recommended patient continue adequate fluid intake, especially during bouts of emesis or diarrhea. Recommended patient restart taking their daily probiotic supplement, as well as begin taking a pre-natal multivitamin. Educated patient on the low FODMAP dietary approach to minimize lower GI symptoms. Recommended that patient log all foods eaten and symptoms that result to begin an elimination diet. Educated patient on the relationship between emotional eating and the development of selective eating. Advised patient to work on viewing food as their source of nutrition, not fulfillment.   Handouts Provided Include  Inflammatory Bowel Disease Nutrition  Therapy Nutrition Care Manual Low FODMAP Nutrition Therapy Nutrition Care Manual  Learning Style & Readiness for Change Teaching method utilized: Visual & Auditory  Demonstrated degree of understanding via: Teach Back  Barriers to learning/adherence to lifestyle change: None  Goals Established by Pt Always choose low-fat or fat-free dairy products. Continue with your Isopure supplement at a serving size of ~20g per meal. Consider starting Benefiber supplement. Take 1 serving  each day  at the same time.  Use your list of high soluble fiber foods to ensure you are getting 5-10 g per day. Continue making wraps with the "Low-Carb" tortillas as long as they do not cause symptoms.   MONITORING & EVALUATION Dietary intake, weekly physical activity, and food log/symptoms in 6 months.  Next Steps  Patient is to log foods and follow up with RD.

## 2020-09-01 NOTE — Patient Instructions (Addendum)
Always choose low-fat or fat-free dairy products.  Continue with your Isopure supplement at a serving size of ~20g per meal.  Consider starting Benefiber supplement. Take 1 serving each day at the same time.  Use your list of high soluble fiber foods to ensure you are getting 5-10 g per day.  Continue making wraps with the "Low-Carb" tortillas as long as they do not cause symptoms.

## 2020-09-17 DIAGNOSIS — F4323 Adjustment disorder with mixed anxiety and depressed mood: Secondary | ICD-10-CM | POA: Diagnosis not present

## 2020-11-06 ENCOUNTER — Other Ambulatory Visit (HOSPITAL_BASED_OUTPATIENT_CLINIC_OR_DEPARTMENT_OTHER): Payer: Self-pay

## 2020-11-06 DIAGNOSIS — Z13220 Encounter for screening for lipoid disorders: Secondary | ICD-10-CM | POA: Diagnosis not present

## 2020-11-06 DIAGNOSIS — R002 Palpitations: Secondary | ICD-10-CM | POA: Diagnosis not present

## 2020-11-06 DIAGNOSIS — Z0000 Encounter for general adult medical examination without abnormal findings: Secondary | ICD-10-CM | POA: Diagnosis not present

## 2020-11-06 DIAGNOSIS — F411 Generalized anxiety disorder: Secondary | ICD-10-CM | POA: Diagnosis not present

## 2020-11-06 MED ORDER — FLUOXETINE HCL 20 MG PO CAPS
ORAL_CAPSULE | ORAL | 1 refills | Status: DC
  Filled 2020-11-06: qty 30, 30d supply, fill #0

## 2020-11-20 DIAGNOSIS — F4323 Adjustment disorder with mixed anxiety and depressed mood: Secondary | ICD-10-CM | POA: Diagnosis not present

## 2020-12-07 DIAGNOSIS — J0140 Acute pansinusitis, unspecified: Secondary | ICD-10-CM | POA: Diagnosis not present

## 2020-12-11 ENCOUNTER — Other Ambulatory Visit (HOSPITAL_BASED_OUTPATIENT_CLINIC_OR_DEPARTMENT_OTHER): Payer: Self-pay

## 2020-12-11 DIAGNOSIS — F411 Generalized anxiety disorder: Secondary | ICD-10-CM | POA: Diagnosis not present

## 2020-12-11 DIAGNOSIS — E669 Obesity, unspecified: Secondary | ICD-10-CM | POA: Diagnosis not present

## 2020-12-11 MED ORDER — FLUOXETINE HCL 40 MG PO CAPS
ORAL_CAPSULE | ORAL | 0 refills | Status: DC
  Filled 2020-12-11: qty 90, 90d supply, fill #0

## 2020-12-23 ENCOUNTER — Ambulatory Visit: Payer: 59 | Admitting: Dietician

## 2021-02-18 ENCOUNTER — Other Ambulatory Visit: Payer: Self-pay

## 2021-02-18 ENCOUNTER — Encounter: Payer: Self-pay | Admitting: Emergency Medicine

## 2021-02-18 ENCOUNTER — Encounter (HOSPITAL_BASED_OUTPATIENT_CLINIC_OR_DEPARTMENT_OTHER): Payer: Self-pay | Admitting: *Deleted

## 2021-02-18 ENCOUNTER — Emergency Department (HOSPITAL_BASED_OUTPATIENT_CLINIC_OR_DEPARTMENT_OTHER)
Admission: EM | Admit: 2021-02-18 | Discharge: 2021-02-18 | Disposition: A | Discharge status: Home / Self Care | Payer: 59 | Attending: Emergency Medicine | Admitting: Emergency Medicine

## 2021-02-18 ENCOUNTER — Ambulatory Visit
Admission: EM | Admit: 2021-02-18 | Discharge: 2021-02-18 | Disposition: A | Discharge status: Home / Self Care | Payer: 59

## 2021-02-18 DIAGNOSIS — R42 Dizziness and giddiness: Secondary | ICD-10-CM | POA: Insufficient documentation

## 2021-02-18 DIAGNOSIS — R002 Palpitations: Secondary | ICD-10-CM | POA: Insufficient documentation

## 2021-02-18 DIAGNOSIS — H5500 Unspecified nystagmus: Secondary | ICD-10-CM | POA: Diagnosis not present

## 2021-02-18 DIAGNOSIS — H538 Other visual disturbances: Secondary | ICD-10-CM | POA: Diagnosis not present

## 2021-02-18 MED ORDER — MECLIZINE HCL 25 MG PO TABS
25.0000 mg | ORAL_TABLET | Freq: Three times a day (TID) | ORAL | 0 refills | Status: DC | PRN
Start: 2021-02-18 — End: 2021-05-06

## 2021-02-18 NOTE — ED Notes (Signed)
D/c paperwork reviewed with pt, including prescription. Pt verbalized understanding, ambulatory to ED exit with steady gait.

## 2021-02-18 NOTE — ED Provider Notes (Signed)
[facility] Provider Note    CSN: [id number] Arrival date & time: 02/18/21 2001  History Chief Complaint  Patient presents with   Dizziness   Blurred [facility] is a 26 y.o. female with no significant PMH reports she has had some mild intermittent dizziness for the last 3 days. It was most severe the first day and has been better since. She describes it as room spinning and feeling off balance. She also reports blurry vision but clarifies that is seeing things like the digital clock on her oven jumping around and only happens when she is feeling dizzy. She reports some palpitations off and on bu no chest pains. She has not had any fever or URI symptoms. Dizziness does not seem to be positional. She was seen at [facility] where she was noted to have nystagmus and sent to the ED for evaluation.    Home Medications Prior to Admission medications   Medication Sig Start Date End Date Taking? Authorizing Provider  meclizine (ANTIVERT) 25 MG tablet Take 1 tablet (25 mg total) by mouth 3 (three) times daily as needed for dizziness. 02/18/21  Yes Truddie Hidden, MD  acetaminophen (TYLENOL) 325 MG tablet Take 2 tablets (650 mg total) by mouth every 4 (four) hours as needed (for pain scale < 4). 07/13/18   Sigmon, Tyler Deis, CNM  aluminum-magnesium hydroxide-simethicone (MAALOX) 803-212-24 MG/5ML SUSP Take 30 mLs by mouth 4 (four) times daily -  before meals and at bedtime. Patient not taking: Reported on 06/02/2020 05/06/19   Delia Heady, PA-C  amoxicillin (AMOXIL) 875 MG tablet Take 1 tablet (875 mg total) by mouth 2 (two) times daily. Patient not taking: Reported on 06/02/2020 04/07/20   Scot Jun, FNP  balsalazide (COLAZAL) 750 MG capsule Take 1,500 mg by mouth in the morning and at bedtime. 05/08/20   [provider]  FLUoxetine (PROZAC) 20 MG capsule Take 1 capsule by mouth once a day 11/06/20     FLUoxetine (PROZAC) 40 MG capsule Take 1  capsule by mouth once a day 12/11/20     fluticasone (FLONASE) 50 MCG/ACT nasal spray Place 2 sprays into both nostrils daily. Patient not taking: Reported on 06/02/2020 04/07/20   Scot Jun, FNP  nitrofurantoin, macrocrystal-monohydrate, (MACROBID) 100 MG capsule Take 1 capsule (100 mg total) by mouth 2 (two) times daily. Patient not taking: Reported on 06/02/2020 09/16/19   Chase Picket, MD  omeprazole (PRILOSEC) 20 MG capsule Take 20 capsules by mouth every morning. 04/27/20   [provider]  ondansetron (ZOFRAN ODT) 4 MG disintegrating tablet Take 1 tablet (4 mg total) by mouth every 8 (eight) hours as needed for nausea or vomiting. 06/02/20   Arnaldo Natal, MD  Probiotic Product (PROBIOTIC-10 PO) Take 1 tablet by mouth daily. Patient not taking: Reported on 07/22/2020    [provider]  esomeprazole (NEXIUM) 20 MG capsule Take 1 capsule (20 mg total) by mouth daily before breakfast. Patient not taking: Reported on 05/06/2019 08/24/18 01/06/20  Jaynee Eagles, PA-C  famotidine (PEPCID) 20 MG tablet Take 1 tablet (20 mg total) by mouth 2 (two) times daily. Patient not taking: Reported on 08/27/2018 08/24/18 08/27/18  Jaynee Eagles, PA-C  hydrochlorothiazide (HYDRODIURIL) 25 MG tablet Take 1 tablet (25 mg total) by mouth daily. Patient not taking: Reported on 08/27/2018 07/13/18 08/27/18  Darliss Cheney, CNM  iron polysaccharides (NIFEREX) 150 MG capsule Take 1 capsule (150 mg total) by mouth  daily. Patient not taking: Reported on 08/27/2018 07/14/18 08/27/18  Darliss Cheney, CNM     Allergies    Aspirin, Fish allergy, Naproxen, Nsaids, and Shellfish allergy   Review of Systems   Review of Systems Please see HPI for pertinent positives and negatives  Physical Exam BP (!) 141/90 (BP Location: Right Arm)    Pulse 63    Temp 98.5 F (36.9 C) (Oral)    Resp 20    Ht 5' 5"  (1.651 m)    Wt 106.6 kg    LMP 02/04/2021    SpO2 100%    BMI 39.11 kg/m   Physical Exam Vitals  and nursing note reviewed.  Constitutional:      Appearance: Normal appearance.  HENT:     Head: Normocephalic and atraumatic.     Right Ear: Tympanic membrane normal.     Left Ear: Tympanic membrane normal.     Nose: Nose normal.     Mouth/Throat:     Mouth: Mucous membranes are moist.  Eyes:     Extraocular Movements: Extraocular movements intact.     Conjunctiva/sclera: Conjunctivae normal.     Comments: Visual acuity is grossly normal  Cardiovascular:     Rate and Rhythm: Normal rate.  Pulmonary:     Effort: Pulmonary effort is normal.     Breath sounds: Normal breath sounds.  Abdominal:     General: Abdomen is flat.     Palpations: Abdomen is soft.     Tenderness: There is no abdominal tenderness.  Musculoskeletal:        General: No swelling. Normal range of motion.     Cervical back: Neck supple.  Skin:    General: Skin is warm and dry.  Neurological:     General: No focal deficit present.     Mental Status: She is alert and oriented to person, place, and time.     Cranial Nerves: No cranial nerve deficit.     Sensory: No sensory deficit.     Motor: No weakness.     Gait: Gait normal.     Comments: No nystagmus  Psychiatric:        Mood and Affect: Mood normal.    ED Results / Procedures / Treatments   EKG EKG Interpretation  Date/Time:  Thursday February 18 2021 20:11:15 EST Ventricular Rate:  62 PR Interval:  138 QRS Duration: 104 QT Interval:  368 QTC Calculation: 373 R Axis:   24 Text Interpretation: Normal sinus rhythm with sinus arrhythmia Normal ECG When compared with ECG of 27-Aug-2018 01:41, PREVIOUS ECG IS PRESENT No significant change since last tracing Confirmed by Calvert Cantor [id number]) on 02/18/2021 8:15:57 PM  Procedures Procedures  Medications Ordered in the ED Medications - No data to display  Initial Impression and Plan  Patient here with dizziness most likely vertiginous. She is not having any nystagmus here. Ambulates with a steady  gait. No concern for central cause. Plan discharge with Rx for Meclizine, Neuro referral if symptoms persist and advised to return for any other concerns.   ED Course       MDM Rules/Calculators/A&P Medical Decision Making Problems Addressed: Vertigo: acute illness or injury  Amount and/or Complexity of Data Reviewed ECG/medicine tests: ordered and independent interpretation performed.  Risk Prescription drug management.    Final Clinical Impression(s) / ED Diagnoses Final diagnoses:  Vertigo    Rx / DC Orders ED Discharge Orders          Ordered  meclizine (ANTIVERT) 25 MG tablet  3 times daily PRN        02/18/21 2046             Truddie Hidden, MD 02/18/21 2046

## 2021-02-18 NOTE — ED Triage Notes (Signed)
Monday began having dizzy spells where her vision would get blurry and it would feel like the room was spinning. Denies pain, loss of consciousness. Says when the episodes happen they usually last a couple hours, then slowly fade away and come back.

## 2021-02-18 NOTE — ED Provider Notes (Signed)
[facility]    CSN: [id number] Arrival date & time: 02/18/21  1844      History   Chief Complaint Chief Complaint  Patient presents with   Dizziness    HPI [facility] is a 26 y.o. female.   Patient presents with intermittent dizzy episodes that has been present for approximately 4 days.  She reports that the first dizziness episode lasted all day, but subsequent episodes last approximately 2 to 3 hours at a time.  No aggravating factors to the dizziness and no relieving factors.  She does have associated shortness of breath and palpitations but denies chest pain.  Also has associated blurry vision at times.  She reports urinary burning that started around the same time as dizziness episodes as well.  Last menstrual cycle was 02/07/2021.  Denies vaginal discharge, hematuria, irregular vaginal bleeding, fever, back pain, pelvic pain, abdominal pain.  Denies any upper respiratory symptoms. denies any known sick contacts.  No head injuries.  Denies nausea, vomiting, abdominal pain, constipation.  Denies blood in stool.  Denies history of the same.   Dizziness  Past Medical History:  Diagnosis Date   Dysmenorrhea    Endometriosis    Heart murmur    Ulcerative colitis [facility])     Patient Active Problem List   Diagnosis Date Noted   Indication for care in labor or delivery 07/11/2018   Gestational hypertension 07/11/2018   Status post vacuum-assisted vaginal delivery 07/11/2018   Postpartum care following vaginal delivery 5/27 07/11/2018   Perineal laceration, second degree 07/11/2018   VISUAL ACUITY, DECREASED 04/19/2010   OVERWEIGHT 04/15/2010   DYSMENORRHEA 04/15/2010   MENORRHAGIA 04/15/2010    Past Surgical History:  Procedure Laterality Date   BIOPSY BOWEL     COLON BIOPSY     hx of colon problems.  ? mega colon, awaiting bx results   NO PAST SURGERIES      OB History     Gravida  2   Para  1   Term  1   Preterm      AB  1   Living  1       SAB  1   IAB      Ectopic      Multiple  0   Live Births  1            Home Medications    Prior to Admission medications   Medication Sig Start Date End Date Taking? Authorizing Provider  acetaminophen (TYLENOL) 325 MG tablet Take 2 tablets (650 mg total) by mouth every 4 (four) hours as needed (for pain scale < 4). 07/13/18   Sigmon, Tyler Deis, CNM  aluminum-magnesium hydroxide-simethicone (MAALOX) 149-702-63 MG/5ML SUSP Take 30 mLs by mouth 4 (four) times daily -  before meals and at bedtime. Patient not taking: Reported on 06/02/2020 05/06/19   Delia Heady, PA-C  amoxicillin (AMOXIL) 875 MG tablet Take 1 tablet (875 mg total) by mouth 2 (two) times daily. Patient not taking: Reported on 06/02/2020 04/07/20   Scot Jun, FNP  balsalazide (COLAZAL) 750 MG capsule Take 1,500 mg by mouth in the morning and at bedtime. 05/08/20   [provider]  FLUoxetine (PROZAC) 20 MG capsule Take 1 capsule by mouth once a day 11/06/20     FLUoxetine (PROZAC) 40 MG capsule Take 1 capsule by mouth once a day 12/11/20     fluticasone (FLONASE) 50 MCG/ACT nasal spray Place 2 sprays into both nostrils daily. Patient  not taking: Reported on 06/02/2020 04/07/20   Scot Jun, FNP  nitrofurantoin, macrocrystal-monohydrate, (MACROBID) 100 MG capsule Take 1 capsule (100 mg total) by mouth 2 (two) times daily. Patient not taking: Reported on 06/02/2020 09/16/19   Chase Picket, MD  omeprazole (PRILOSEC) 20 MG capsule Take 20 capsules by mouth every morning. 04/27/20   [provider]  ondansetron (ZOFRAN ODT) 4 MG disintegrating tablet Take 1 tablet (4 mg total) by mouth every 8 (eight) hours as needed for nausea or vomiting. 06/02/20   Arnaldo Natal, MD  Probiotic Product (PROBIOTIC-10 PO) Take 1 tablet by mouth daily. Patient not taking: Reported on 07/22/2020    [provider]  esomeprazole (NEXIUM) 20 MG capsule Take 1 capsule (20 mg total) by mouth daily before  breakfast. Patient not taking: Reported on 05/06/2019 08/24/18 01/06/20  Jaynee Eagles, PA-C  famotidine (PEPCID) 20 MG tablet Take 1 tablet (20 mg total) by mouth 2 (two) times daily. Patient not taking: Reported on 08/27/2018 08/24/18 08/27/18  Jaynee Eagles, PA-C  hydrochlorothiazide (HYDRODIURIL) 25 MG tablet Take 1 tablet (25 mg total) by mouth daily. Patient not taking: Reported on 08/27/2018 07/13/18 08/27/18  Darliss Cheney, CNM  iron polysaccharides (NIFEREX) 150 MG capsule Take 1 capsule (150 mg total) by mouth daily. Patient not taking: Reported on 08/27/2018 07/14/18 08/27/18  Darliss Cheney, CNM    Family History Family History  Problem Relation Age of Onset   Diabetes Maternal Grandmother    Hypertension Maternal Grandmother    Heart disease Maternal Grandmother    Breast cancer Maternal Grandmother        unsure age of onset   Diabetes Paternal Grandmother    Heart disease Paternal Grandmother    Hypertension Paternal Grandmother    Diabetes Paternal Grandfather    Heart disease Paternal Grandfather    Hypertension Paternal Grandfather    Healthy Mother    Healthy Father     Social History Social History   Tobacco Use   Smoking status: Never   Smokeless tobacco: Never  Vaping Use   Vaping Use: Never used  Substance Use Topics   Alcohol use: No   Drug use: No     Allergies   Aspirin, Fish allergy, Naproxen, Nsaids, and Shellfish allergy   Review of Systems Review of Systems Per HPI  Physical Exam Triage Vital Signs ED Triage Vitals [02/18/21 1909]  Enc Vitals Group     BP 117/68     Pulse Rate 66     Resp 16     Temp 97.9 F (36.6 C)     Temp Source Oral     SpO2 97 %     Weight      Height      Head Circumference      Peak Flow      Pain Score 0     Pain Loc      Pain Edu?      Excl. in [place]?    No data found.  Updated Vital Signs BP 117/68 (BP Location: Left Arm)    Pulse 66    Temp 97.9 F (36.6 C) (Oral)    Resp 16    SpO2 97%    Visual Acuity Right Eye Distance:   Left Eye Distance:   Bilateral Distance:    Right Eye Near:   Left Eye Near:    Bilateral Near:     Physical Exam Constitutional:      General: She  is not in acute distress.    Appearance: Normal appearance. She is not toxic-appearing or diaphoretic.  HENT:     Head: Normocephalic and atraumatic.  Eyes:     Extraocular Movements: Extraocular movements intact.     Conjunctiva/sclera: Conjunctivae normal.  Cardiovascular:     Rate and Rhythm: Normal rate and regular rhythm.     Pulses: Normal pulses.     Heart sounds: Normal heart sounds.  Pulmonary:     Effort: Pulmonary effort is normal. No respiratory distress.     Breath sounds: Normal breath sounds.  Neurological:     General: No focal deficit present.     Mental Status: She is alert and oriented to person, place, and time. Mental status is at baseline.     Sensory: Sensation is intact.     Motor: Motor function is intact.     Coordination: Coordination is intact.     Gait: Gait is intact.     Comments: Nystagmus with extraocular movements.  Psychiatric:        Mood and Affect: Mood normal.        Behavior: Behavior normal.        Thought Content: Thought content normal.        Judgment: Judgment normal.     UC Treatments / Results  Labs (all labs ordered are listed, but only abnormal results are displayed) Labs Reviewed - No data to display  EKG   Radiology No results found.  Procedures Procedures (including critical care time)  Medications Ordered in UC Medications - No data to display  Initial Impression / Assessment and Plan / UC Course  I have reviewed the triage vital signs and the nursing notes.  Pertinent labs & imaging results that were available during my care of the patient were reviewed by me and considered in my medical decision making (see chart for details).     Patient was found to have nystagmus on physical exam with extraocular movements.   This warrants further evaluation and management at the hospital.  Advised patient that she will need to go to the hospital for further evaluation and management.  Patient was agreeable with plan.  Vital signs stable at discharge.  Agree with patient self transport to the hospital. Final Clinical Impressions(s) / UC Diagnoses   Final diagnoses:  Dizziness and giddiness  Nystagmus     Discharge Instructions      Please go to the hospital soon as you leave urgent care for further evaluation and management.    ED Prescriptions   None    PDMP not reviewed this encounter.   Teodora Medici, [place] 02/18/21 1930

## 2021-02-18 NOTE — Discharge Instructions (Addendum)
Please go to the hospital soon as you leave urgent care for further evaluation and management.

## 2021-02-18 NOTE — ED Triage Notes (Signed)
Dizziness and blurred vision x 3 days. She drove herself here.

## 2021-02-22 DIAGNOSIS — Z03818 Encounter for observation for suspected exposure to other biological agents ruled out: Secondary | ICD-10-CM | POA: Diagnosis not present

## 2021-02-22 DIAGNOSIS — B349 Viral infection, unspecified: Secondary | ICD-10-CM | POA: Diagnosis not present

## 2021-02-22 DIAGNOSIS — H6983 Other specified disorders of Eustachian tube, bilateral: Secondary | ICD-10-CM | POA: Diagnosis not present

## 2021-02-22 DIAGNOSIS — R42 Dizziness and giddiness: Secondary | ICD-10-CM | POA: Diagnosis not present

## 2021-02-23 ENCOUNTER — Other Ambulatory Visit: Payer: Self-pay | Admitting: Physician Assistant

## 2021-02-23 DIAGNOSIS — J069 Acute upper respiratory infection, unspecified: Secondary | ICD-10-CM | POA: Diagnosis not present

## 2021-02-23 DIAGNOSIS — R42 Dizziness and giddiness: Secondary | ICD-10-CM | POA: Diagnosis not present

## 2021-03-03 ENCOUNTER — Other Ambulatory Visit: Payer: 59

## 2021-03-07 ENCOUNTER — Ambulatory Visit
Admission: EM | Admit: 2021-03-07 | Discharge: 2021-03-07 | Disposition: A | Discharge status: Home / Self Care | Payer: 59 | Attending: Physician Assistant | Admitting: Physician Assistant

## 2021-03-07 ENCOUNTER — Other Ambulatory Visit: Payer: Self-pay

## 2021-03-07 ENCOUNTER — Encounter: Payer: Self-pay | Admitting: Physician Assistant

## 2021-03-07 DIAGNOSIS — J069 Acute upper respiratory infection, unspecified: Secondary | ICD-10-CM | POA: Diagnosis not present

## 2021-03-07 DIAGNOSIS — Z3A01 Less than 8 weeks gestation of pregnancy: Secondary | ICD-10-CM

## 2021-03-07 NOTE — ED Provider Notes (Signed)
[facility]    CSN: [id number] Arrival date & time: 03/07/21  1421      History   Chief Complaint Chief Complaint  Patient presents with   URI    HPI Kathleen Hamilton is a 26 y.o. female.   Patient here today for evaluation of nasal congestion, sore throat, cough, body aches and headaches that started this morning.  She reports that symptoms started suddenly around 1 AM.  She is currently [redacted] weeks pregnant and has taken Tylenol without significant relief.  She has nausea due to pregnancy but no diarrhea.  The history is provided by the patient.  URI Presenting symptoms: congestion, cough and sore throat   Presenting symptoms: no ear pain and no fever   Associated symptoms: myalgias   Associated symptoms: no wheezing    Past Medical History:  Diagnosis Date   Dysmenorrhea    Endometriosis    Heart murmur    Ulcerative colitis [facility])     Patient Active Problem List   Diagnosis Date Noted   Indication for care in labor or delivery 07/11/2018   Gestational hypertension 07/11/2018   Status post vacuum-assisted vaginal delivery 07/11/2018   Postpartum care following vaginal delivery 5/27 07/11/2018   Perineal laceration, second degree 07/11/2018   VISUAL ACUITY, DECREASED 04/19/2010   OVERWEIGHT 04/15/2010   DYSMENORRHEA 04/15/2010   MENORRHAGIA 04/15/2010    Past Surgical History:  Procedure Laterality Date   BIOPSY BOWEL     COLON BIOPSY     hx of colon problems.  ? mega colon, awaiting bx results   NO PAST SURGERIES      OB History     Gravida  2   Para  1   Term  1   Preterm      AB  1   Living  1      SAB  1   IAB      Ectopic      Multiple  0   Live Births  1            Home Medications    Prior to Admission medications   Medication Sig Start Date End Date Taking? Authorizing Provider  acetaminophen (TYLENOL) 325 MG tablet Take 2 tablets (650 mg total) by mouth every 4 (four) hours as needed (for pain scale < 4).  07/13/18   Sigmon, Tyler Deis, CNM  aluminum-magnesium hydroxide-simethicone (MAALOX) 878-676-72 MG/5ML SUSP Take 30 mLs by mouth 4 (four) times daily -  before meals and at bedtime. Patient not taking: Reported on 06/02/2020 05/06/19   Delia Heady, PA-C  amoxicillin (AMOXIL) 875 MG tablet Take 1 tablet (875 mg total) by mouth 2 (two) times daily. Patient not taking: Reported on 06/02/2020 04/07/20   Scot Jun, FNP  balsalazide (COLAZAL) 750 MG capsule Take 1,500 mg by mouth in the morning and at bedtime. 05/08/20   [provider]  FLUoxetine (PROZAC) 20 MG capsule Take 1 capsule by mouth once a day 11/06/20     FLUoxetine (PROZAC) 40 MG capsule Take 1 capsule by mouth once a day 12/11/20     fluticasone (FLONASE) 50 MCG/ACT nasal spray Place 2 sprays into both nostrils daily. Patient not taking: Reported on 06/02/2020 04/07/20   Scot Jun, FNP  meclizine (ANTIVERT) 25 MG tablet Take 1 tablet (25 mg total) by mouth 3 (three) times daily as needed for dizziness. 02/18/21   Truddie Hidden, MD  nitrofurantoin, macrocrystal-monohydrate, (MACROBID) 100 MG capsule Take 1  capsule (100 mg total) by mouth 2 (two) times daily. Patient not taking: Reported on 06/02/2020 09/16/19   Chase Picket, MD  omeprazole (PRILOSEC) 20 MG capsule Take 20 capsules by mouth every morning. 04/27/20   [provider]  ondansetron (ZOFRAN ODT) 4 MG disintegrating tablet Take 1 tablet (4 mg total) by mouth every 8 (eight) hours as needed for nausea or vomiting. 06/02/20   Arnaldo Natal, MD  Probiotic Product (PROBIOTIC-10 PO) Take 1 tablet by mouth daily. Patient not taking: Reported on 07/22/2020    [provider]  esomeprazole (NEXIUM) 20 MG capsule Take 1 capsule (20 mg total) by mouth daily before breakfast. Patient not taking: Reported on 05/06/2019 08/24/18 01/06/20  Jaynee Eagles, PA-C  famotidine (PEPCID) 20 MG tablet Take 1 tablet (20 mg total) by mouth 2 (two) times daily. Patient  not taking: Reported on 08/27/2018 08/24/18 08/27/18  Jaynee Eagles, PA-C  hydrochlorothiazide (HYDRODIURIL) 25 MG tablet Take 1 tablet (25 mg total) by mouth daily. Patient not taking: Reported on 08/27/2018 07/13/18 08/27/18  Darliss Cheney, CNM  iron polysaccharides (NIFEREX) 150 MG capsule Take 1 capsule (150 mg total) by mouth daily. Patient not taking: Reported on 08/27/2018 07/14/18 08/27/18  Darliss Cheney, CNM    Family History Family History  Problem Relation Age of Onset   Diabetes Maternal Grandmother    Hypertension Maternal Grandmother    Heart disease Maternal Grandmother    Breast cancer Maternal Grandmother        unsure age of onset   Diabetes Paternal Grandmother    Heart disease Paternal Grandmother    Hypertension Paternal Grandmother    Diabetes Paternal Grandfather    Heart disease Paternal Grandfather    Hypertension Paternal Grandfather    Healthy Mother    Healthy Father     Social History Social History   Tobacco Use   Smoking status: Never   Smokeless tobacco: Never  Vaping Use   Vaping Use: Never used  Substance Use Topics   Alcohol use: No   Drug use: No     Allergies   Aspirin, Fish allergy, Naproxen, Nsaids, and Shellfish allergy   Review of Systems Review of Systems  Constitutional:  Positive for chills. Negative for fever.  HENT:  Positive for congestion and sore throat. Negative for ear pain.   Eyes:  Negative for discharge and redness.  Respiratory:  Positive for cough. Negative for shortness of breath and wheezing.   Gastrointestinal:  Positive for nausea. Negative for abdominal pain, diarrhea and vomiting.  Musculoskeletal:  Positive for myalgias.    Physical Exam Triage Vital Signs ED Triage Vitals  Enc Vitals Group     BP 03/07/21 1453 125/75     Pulse Rate 03/07/21 1453 (!) 105     Resp 03/07/21 1453 18     Temp 03/07/21 1453 99.1 F (37.3 C)     Temp Source 03/07/21 1453 Oral     SpO2 03/07/21 1453 96 %     Weight  --      Height --      Head Circumference --      Peak Flow --      Pain Score 03/07/21 1454 6     Pain Loc --      Pain Edu? --      Excl. in [place]? --    No data found.  Updated Vital Signs BP 125/75 (BP Location: Left Arm)    Pulse (!) 105  Temp 99.1 F (37.3 C) (Oral)    Resp 18    SpO2 96%      Physical Exam Vitals and nursing note reviewed.  Constitutional:      General: She is not in acute distress.    Appearance: Normal appearance. She is not ill-appearing.  HENT:     Head: Normocephalic and atraumatic.     Nose: Congestion present.     Mouth/Throat:     Mouth: Mucous membranes are moist.     Pharynx: No oropharyngeal exudate or posterior oropharyngeal erythema.  Eyes:     Conjunctiva/sclera: Conjunctivae normal.  Cardiovascular:     Rate and Rhythm: Normal rate and regular rhythm.     Heart sounds: Normal heart sounds. No murmur heard. Pulmonary:     Effort: Pulmonary effort is normal. No respiratory distress.     Breath sounds: Normal breath sounds. No wheezing, rhonchi or rales.  Skin:    General: Skin is warm and dry.  Neurological:     Mental Status: She is alert.  Psychiatric:        Mood and Affect: Mood normal.        Thought Content: Thought content normal.     UC Treatments / Results  Labs (all labs ordered are listed, but only abnormal results are displayed) Labs Reviewed  COVID-19, FLU A+B NAA    EKG   Radiology No results found.  Procedures Procedures (including critical care time)  Medications Ordered in UC Medications - No data to display  Initial Impression / Assessment and Plan / UC Course  I have reviewed the triage vital signs and the nursing notes.  Pertinent labs & imaging results that were available during my care of the patient were reviewed by me and considered in my medical decision making (see chart for details).    Suspect viral etiology of symptoms.  Will order screening for COVID and flu.  Will await results  further recommendation.  Encouraged increase fluids and Tylenol if needed for symptom relief.  Final Clinical Impressions(s) / UC Diagnoses   Final diagnoses:  Acute upper respiratory infection  Less than [redacted] weeks gestation of pregnancy   Discharge Instructions   None    ED Prescriptions   None    PDMP not reviewed this encounter.   Francene Finders, PA-C 03/07/21 1514

## 2021-03-07 NOTE — ED Triage Notes (Signed)
Pt here for URI sx with body aches and congestion; pt sts is currently [redacted] weeks pregnant

## 2021-03-08 LAB — COVID-19, FLU A+B NAA
Influenza A, NAA: NOT DETECTED
Influenza B, NAA: NOT DETECTED
SARS-CoV-2, NAA: NOT DETECTED

## 2021-03-09 DIAGNOSIS — J029 Acute pharyngitis, unspecified: Secondary | ICD-10-CM | POA: Diagnosis not present

## 2021-03-09 DIAGNOSIS — Z3200 Encounter for pregnancy test, result unknown: Secondary | ICD-10-CM | POA: Diagnosis not present

## 2021-03-09 DIAGNOSIS — R0981 Nasal congestion: Secondary | ICD-10-CM | POA: Diagnosis not present

## 2021-03-09 DIAGNOSIS — R059 Cough, unspecified: Secondary | ICD-10-CM | POA: Diagnosis not present

## 2021-03-09 DIAGNOSIS — F4323 Adjustment disorder with mixed anxiety and depressed mood: Secondary | ICD-10-CM | POA: Diagnosis not present

## 2021-05-06 ENCOUNTER — Ambulatory Visit: Payer: Medicaid Other | Admitting: *Deleted

## 2021-05-06 ENCOUNTER — Ambulatory Visit (INDEPENDENT_AMBULATORY_CARE_PROVIDER_SITE_OTHER): Payer: Medicaid Other | Admitting: Advanced Practice Midwife

## 2021-05-06 ENCOUNTER — Encounter: Payer: Self-pay | Admitting: Advanced Practice Midwife

## 2021-05-06 ENCOUNTER — Other Ambulatory Visit: Payer: Self-pay

## 2021-05-06 VITALS — BP 126/85 | HR 108 | Wt 254.0 lb

## 2021-05-06 DIAGNOSIS — O0991 Supervision of high risk pregnancy, unspecified, first trimester: Secondary | ICD-10-CM

## 2021-05-06 DIAGNOSIS — F419 Anxiety disorder, unspecified: Secondary | ICD-10-CM | POA: Insufficient documentation

## 2021-05-06 DIAGNOSIS — Z8759 Personal history of other complications of pregnancy, childbirth and the puerperium: Secondary | ICD-10-CM | POA: Diagnosis not present

## 2021-05-06 DIAGNOSIS — Z3A13 13 weeks gestation of pregnancy: Secondary | ICD-10-CM | POA: Diagnosis not present

## 2021-05-06 DIAGNOSIS — O0990 Supervision of high risk pregnancy, unspecified, unspecified trimester: Secondary | ICD-10-CM | POA: Insufficient documentation

## 2021-05-06 DIAGNOSIS — O30049 Twin pregnancy, dichorionic/diamniotic, unspecified trimester: Secondary | ICD-10-CM | POA: Insufficient documentation

## 2021-05-06 DIAGNOSIS — Z6839 Body mass index (BMI) 39.0-39.9, adult: Secondary | ICD-10-CM

## 2021-05-06 DIAGNOSIS — O30041 Twin pregnancy, dichorionic/diamniotic, first trimester: Secondary | ICD-10-CM | POA: Diagnosis not present

## 2021-05-06 DIAGNOSIS — D573 Sickle-cell trait: Secondary | ICD-10-CM

## 2021-05-06 LAB — POCT URINALYSIS DIPSTICK OB
Blood, UA: NEGATIVE
Glucose, UA: NEGATIVE
Ketones, UA: NEGATIVE
Leukocytes, UA: NEGATIVE
Nitrite, UA: POSITIVE
POC,PROTEIN,UA: NEGATIVE

## 2021-05-06 MED ORDER — CEFADROXIL 500 MG PO CAPS
500.0000 mg | ORAL_CAPSULE | Freq: Two times a day (BID) | ORAL | 0 refills | Status: DC
Start: 2021-05-06 — End: 2021-06-30

## 2021-05-06 NOTE — Progress Notes (Signed)
? ?INITIAL OBSTETRICAL VISIT ?Patient name: Kathleen Hamilton MRN [id number]  Date of birth: [birth year] ?Chief Complaint:   ?Initial Prenatal Visit ? ?History of Present Illness:   ?Kathleen Hamilton is a 26 y.o. G56P1011  female at 66w0dby [place] 6 weeks with an Estimated Date of Delivery: 11/11/21 being seen today for her initial obstetrical visit.   ?Her obstetrical history is significant for term SVD w/o problems. Hx GHTN. She is transferring care from [facility]  Pg dx at DC/DA at 6.4 week [place]    ?Today she reports no complaints. + UTI.  ? ?  05/06/2021  ? 10:58 AM 07/22/2020  ? 10:41 AM  ?Depression screen PHQ 2/9  ?Decreased Interest 1 0  ?Down, Depressed, Hopeless 0 0  ?PHQ - 2 Score 1 0  ?Altered sleeping 1   ?Tired, decreased energy 1   ?Change in appetite 0   ?Feeling bad or failure about yourself  0   ?Trouble concentrating 0   ?Moving slowly or fidgety/restless 0   ?Suicidal thoughts 0   ?PHQ-9 Score 3   ? ? ?Patient's last menstrual period was 02/04/2021. ?Last pap 07/02/20. Results were: normal ?Review of Systems:   ?Pertinent items are noted in HPI ?Denies cramping/contractions, leakage of fluid, vaginal bleeding, abnormal vaginal discharge w/ itching/odor/irritation, headaches, visual changes, shortness of breath, chest pain, abdominal pain, severe nausea/vomiting, or problems with urination or bowel movements unless otherwise stated above.  ?Pertinent History Reviewed:  ?Reviewed past medical,surgical, social, obstetrical and family history.  ?Reviewed problem list, medications and allergies. ?OB History  ?Gravida Para Term Preterm AB Living  ?3 1 1   1 1   ?SAB IAB Ectopic Multiple Live Births  ?1     0 1  ?  ?# Outcome Date GA Lbr Len/2nd Weight Sex Delivery Anes PTL Lv  ?3 Current           ?2 Term 07/11/18 [id number]:55 / 00:32 5 lb 0.8 oz (2.29 kg) F Vag-Vacuum EPI, Local N LIV  ?   Complications: Small for gestational age  ?1 SAB           ? ?Physical Assessment:  ? ?Vitals:  ? 05/06/21 1039  ?BP: 126/85   ?Pulse: (!) 108  ?Weight: 254 lb (115.2 kg)  ?Body mass index is 42.27 kg/m?. ? ?     Physical Examination: ? General appearance - well appearing, and in no distress ? Mental status - alert, oriented to person, place, and time ? Psych:  She has a normal mood and affect ? Skin - warm and dry, normal color, no suspicious lesions noted ? Chest - effort normal ? Heart - normal rate and regular rhythm ? Abdomen - soft, nontender ? Extremities:  No swelling or varicosities noted ?   ?Results for orders placed or performed in visit on 05/06/21 (from the past 24 hour(s))  ?POC Urinalysis Dipstick OB  ? Collection Time: 05/06/21 11:25 AM  ?Result Value Ref Range  ? Color, UA    ? Clarity, UA    ? Glucose, UA Negative Negative  ? Bilirubin, UA    ? Ketones, UA neg   ? Spec Grav, UA    ? Blood, UA neg   ? pH, UA    ? POC,PROTEIN,UA Negative Negative, Trace, Small (1+), Moderate (2+), Large (3+), 4+  ? Urobilinogen, UA    ? Nitrite, UA positive   ? Leukocytes, UA Negative Negative  ? Appearance    ? Odor    ?  ?  Assessment & Plan:  ?1) High-Risk Pregnancy G3P1011 at 93w0dwith an Estimated Date of Delivery: 11/11/21  ? ?2) Hx IUGR ? ?3) DC/DA twins: monthly growth US/ testing @ 36 weeks, deliver 359? ?4)  + Nitrites:  rx duricef ? ?5)  SCT: FOB neg ur cx q trimester ? ?6) Hx ghtn, twins  hxof gastric bleed, can't take ASA ? ?Meds:  ?Meds ordered this encounter  ?Medications  ? cefadroxil (DURICEF) 500 MG capsule  ?  Sig: Take 1 capsule (500 mg total) by mouth 2 (two) times daily.  ?  Dispense:  14 capsule  ?  Refill:  0  ?  Order Specific Question:   Supervising Provider  ?  Answer:   ETania AdeH [[id number]]  ? ? ?Initial labs obtained ?Continue prenatal vitamins ?Reviewed n/v relief measures and warning s/s to report ?Reviewed recommended weight gain based on pre-gravid BMI ?Encouraged well-balanced diet ?Genetic & carrier screening discussed: requests Panorama, AFP, and Horizon , declines NT/IT ?Ultrasound discussed; fetal  survey: requested ?CCNC completed> form faxed if has or is planning to apply for medicaid ?[facility] [place] MDs and other Advanced Practice Providers was explained to patient; also emphasized that fellows, residents, and students are part of our team. ?Has home bp cuff. Check bp weekly, let [place]now if >140/90.       ? ?FJoaquim LaiCresenzo-Dishmon ?11:49 AM ? ?

## 2021-05-06 NOTE — Patient Instructions (Signed)
[employer], I greatly value your feedback.  If you receive a survey following your visit with [place] today, we appreciate you taking the time to fill it out.  ?Thanks, ?Kathleen Berthold, DNP, CNM ? ?[place]!!! ?It is now [facility] [place] at [facility] ?([address] [place], [place] [address]) ?Entrance located off of [place] ?Free 24/7 valet parking  ? Nausea & Vomiting ?Have saltine crackers or pretzels by your bed and eat a few bites before you raise your head out of bed in the morning ?Eat small frequent meals throughout the day instead of large meals ?Drink plenty of fluids throughout the day to stay hydrated, just don't drink a lot of fluids with your meals.  This can make your stomach fill up faster making you feel sick ?Do not brush your teeth right after you eat ?Products with real ginger are good for nausea, like ginger ale and ginger hard candy Make sure it says made with real ginger! ?Sucking on sour candy like lemon heads is also good for nausea ?If your prenatal vitamins make you nauseated, take them at night so you will sleep through the nausea ?Sea Bands ?If you feel like you need medicine for the nausea & vomiting please let us know ?If you are unable to keep any fluids or food down please let us know ? ? Constipation ?Drink plenty of fluid, preferably water, throughout the day ?Eat foods high in fiber such as fruits, vegetables, and grains ?Exercise, such as walking, is a good way to keep your bowels regular ?Drink warm fluids, especially warm prune juice, or decaf coffee ?Eat a 1/2 cup of real oatmeal (not instant), 1/2 cup applesauce, and 1/2-1 cup warm prune juice every day ?If needed, you may take Colace (docusate sodium) stool softener once or twice a day to help keep the stool soft.  ?If you still are having problems with constipation, you may take Miralax once daily as needed to help keep your bowels regular.  ? ?Home Blood Pressure Monitoring for  Patients  ? ?Your provider has recommended that you check your blood pressure (BP) at least once a week at home. If you do not have a blood pressure cuff at home, one will be provided for you. Contact your provider if you have not received your monitor within 1 week.  ? ?Helpful Tips for Accurate Home Blood Pressure Checks  ?Don't smoke, exercise, or drink caffeine 30 minutes before checking your BP ?Use the restroom before checking your BP (a full bladder can raise your pressure) ?Relax in a comfortable upright chair ?Feet on the ground ?Left arm resting comfortably on a flat surface at the level of your heart ?Legs uncrossed ?Back supported ?Sit quietly and don't talk ?Place the cuff on your bare arm ?Adjust snuggly, so that only two fingertips can fit between your skin and the top of the cuff ?Check 2 readings separated by at least one minute ?Keep a log of your BP readings ?For a visual, please reference this diagram: http://ccnc.care/bpdiagram ? ?Provider Name: [facility]     Phone: [phone] ? ?Zone 1: ALL CLEAR  ?Continue to monitor your symptoms:  ?BP reading is less than 140 (top number) or less than 90 (bottom number)  ?No right upper stomach pain ?No headaches or seeing spots ?No feeling nauseated or throwing up ?No swelling in face and hands ? ?Zone 2: CAUTION ?Call your doctor's office for any of the following:  ?BP  reading is greater than 140 (top number) or greater than 90 (bottom number)  ?Stomach pain under your ribs in the middle or right side ?Headaches or seeing spots ?Feeling nauseated or throwing up ?Swelling in face and hands ? ?Zone 3: EMERGENCY  ?Seek immediate medical care if you have any of the following:  ?BP reading is greater than160 (top number) or greater than 110 (bottom number) ?Severe headaches not improving with Tylenol ?Serious difficulty catching your breath ?Any worsening symptoms from Zone 2  ? ? First Trimester of Pregnancy ?The first trimester of pregnancy is from  week 1 until the end of week 12 (months 1 through 3). A week after a sperm fertilizes an egg, the egg will implant on the wall of the uterus. This embryo will begin to develop into a baby. Genes from you and your partner are forming the baby. The female genes determine whether the baby is a boy or a girl. At 6-8 weeks, the eyes and face are formed, and the heartbeat can be seen on ultrasound. At the end of 12 weeks, all the baby's organs are formed.  ?Now that you are pregnant, you will want to do everything you can to have a healthy baby. Two of the most important things are to get good prenatal care and to follow your health care provider's instructions. Prenatal care is all the medical care you receive before the baby's birth. This care will help prevent, find, and treat any problems during the pregnancy and childbirth. ?BODY CHANGES ?Your body goes through many changes during pregnancy. The changes vary from woman to woman.  ?You may gain or lose a couple of pounds at first. ?You may feel sick to your stomach (nauseous) and throw up (vomit). If the vomiting is uncontrollable, call your health care provider. ?You may tire easily. ?You may develop headaches that can be relieved by medicines approved by your health care provider. ?You may urinate more often. Painful urination may mean you have a bladder infection. ?You may develop heartburn as a result of your pregnancy. ?You may develop constipation because certain hormones are causing the muscles that push waste through your intestines to slow down. ?You may develop hemorrhoids or swollen, bulging veins (varicose veins). ?Your breasts may begin to grow larger and become tender. Your nipples may stick out more, and the tissue that surrounds them (areola) may become darker. ?Your gums may bleed and may be sensitive to brushing and flossing. ?Dark spots or blotches (chloasma, mask of pregnancy) may develop on your face. This will likely fade after the baby is  born. ?Your menstrual periods will stop. ?You may have a loss of appetite. ?You may develop cravings for certain kinds of food. ?You may have changes in your emotions from day to day, such as being excited to be pregnant or being concerned that something may go wrong with the pregnancy and baby. ?You may have more vivid and strange dreams. ?You may have changes in your hair. These can include thickening of your hair, rapid growth, and changes in texture. Some women also have hair loss during or after pregnancy, or hair that feels dry or thin. Your hair will most likely return to normal after your baby is born. ?WHAT TO EXPECT AT YOUR PRENATAL VISITS ?During a routine prenatal visit: ?You will be weighed to make sure you and the baby are growing normally. ?Your blood pressure will be taken. ?Your abdomen will be measured to track your baby's growth. ?The fetal heartbeat  will be listened to starting around week 10 or 12 of your pregnancy. ?Test results from any previous visits will be discussed. ?Your health care provider may ask you: ?How you are feeling. ?If you are feeling the baby move. ?If you have had any abnormal symptoms, such as leaking fluid, bleeding, severe headaches, or abdominal cramping. ?If you have any questions. ?Other tests that may be performed during your first trimester include: ?Blood tests to find your blood type and to check for the presence of any previous infections. They will also be used to check for low iron levels (anemia) and Rh antibodies. Later in the pregnancy, blood tests for diabetes will be done along with other tests if problems develop. ?Urine tests to check for infections, diabetes, or protein in the urine. ?An ultrasound to confirm the proper growth and development of the baby. ?An amniocentesis to check for possible genetic problems. ?Fetal screens for spina bifida and Down syndrome. ?You may need other tests to make sure you and the baby are doing well. ?HOME CARE  INSTRUCTIONS  ?Medicines ?Follow your health care provider's instructions regarding medicine use. Specific medicines may be either safe or unsafe to take during pregnancy. ?Take your prenatal vitamins as directed. ?If you

## 2021-05-07 LAB — COMPREHENSIVE METABOLIC PANEL
ALT: 19 IU/L (ref 0–32)
AST: 24 IU/L (ref 0–40)
Albumin/Globulin Ratio: 1.1 — ABNORMAL LOW (ref 1.2–2.2)
Albumin: 3.8 g/dL — ABNORMAL LOW (ref 3.9–5.0)
Alkaline Phosphatase: 60 IU/L (ref 44–121)
BUN/Creatinine Ratio: 10 (ref 9–23)
BUN: 5 mg/dL — ABNORMAL LOW (ref 6–20)
Bilirubin Total: 0.3 mg/dL (ref 0.0–1.2)
CO2: 17 mmol/L — ABNORMAL LOW (ref 20–29)
Calcium: 9.4 mg/dL (ref 8.7–10.2)
Chloride: 101 mmol/L (ref 96–106)
Creatinine, Ser: 0.52 mg/dL — ABNORMAL LOW (ref 0.57–1.00)
Globulin, Total: 3.6 g/dL (ref 1.5–4.5)
Glucose: 84 mg/dL (ref 70–99)
Potassium: 4.5 mmol/L (ref 3.5–5.2)
Sodium: 134 mmol/L (ref 134–144)
Total Protein: 7.4 g/dL (ref 6.0–8.5)
eGFR: 132 mL/min/{1.73_m2} (ref >59)

## 2021-05-07 LAB — CBC/D/PLT+RPR+RH+ABO+RUBIGG...
Antibody Screen: NEGATIVE
Basophils Absolute: 0 10*3/uL (ref 0.0–0.2)
Basos: 0 %
EOS (ABSOLUTE): 0.1 10*3/uL (ref 0.0–0.4)
Eos: 1 %
HCV Ab: NONREACTIVE
HIV Screen 4th Generation wRfx: NONREACTIVE
Hematocrit: 40.8 % (ref 34.0–46.6)
Hemoglobin: 13.3 g/dL (ref 11.1–15.9)
Hepatitis B Surface Ag: NEGATIVE
Immature Grans (Abs): 0 10*3/uL (ref 0.0–0.1)
Immature Granulocytes: 0 %
Lymphocytes Absolute: 1.7 10*3/uL (ref 0.7–3.1)
Lymphs: 16 %
MCH: 30.2 pg (ref 26.6–33.0)
MCHC: 32.6 g/dL (ref 31.5–35.7)
MCV: 93 fL (ref 79–97)
Monocytes Absolute: 0.5 10*3/uL (ref 0.1–0.9)
Monocytes: 5 %
Neutrophils Absolute: 8.4 10*3/uL — ABNORMAL HIGH (ref 1.4–7.0)
Neutrophils: 78 %
Platelets: 392 10*3/uL (ref 150–450)
RBC: 4.4 x10E6/uL (ref 3.77–5.28)
RDW: 13.6 % (ref 11.7–15.4)
RPR Ser Ql: NONREACTIVE
Rh Factor: POSITIVE
Rubella Antibodies, IGG: 4.27 index (ref >0.99)
WBC: 10.8 10*3/uL (ref 3.4–10.8)

## 2021-05-07 LAB — PROTEIN / CREATININE RATIO, URINE
Creatinine, Urine: 140.8 mg/dL
Protein, Ur: 28.1 mg/dL
Protein/Creat Ratio: 200 mg/g creat (ref 0–200)

## 2021-05-07 LAB — HEMOGLOBIN A1C
Est. average glucose Bld gHb Est-mCnc: 88 mg/dL
Hgb A1c MFr Bld: 4.7 % — ABNORMAL LOW (ref 4.8–5.6)

## 2021-05-07 LAB — HCV INTERPRETATION

## 2021-05-08 LAB — GC/CHLAMYDIA PROBE AMP
Chlamydia trachomatis, NAA: NEGATIVE
Neisseria Gonorrhoeae by PCR: NEGATIVE

## 2021-05-11 LAB — URINE CULTURE

## 2021-05-17 ENCOUNTER — Encounter: Payer: Self-pay | Admitting: Advanced Practice Midwife

## 2021-05-18 ENCOUNTER — Encounter: Payer: Self-pay | Admitting: Advanced Practice Midwife

## 2021-05-26 ENCOUNTER — Encounter: Payer: Self-pay | Admitting: Women's Health

## 2021-05-27 ENCOUNTER — Ambulatory Visit (INDEPENDENT_AMBULATORY_CARE_PROVIDER_SITE_OTHER): Payer: Medicaid Other | Admitting: Obstetrics & Gynecology

## 2021-05-27 ENCOUNTER — Encounter: Payer: Self-pay | Admitting: Advanced Practice Midwife

## 2021-05-27 ENCOUNTER — Encounter: Payer: Self-pay | Admitting: Obstetrics & Gynecology

## 2021-05-27 VITALS — BP 121/79 | HR 93 | Wt 249.8 lb

## 2021-05-27 DIAGNOSIS — O30042 Twin pregnancy, dichorionic/diamniotic, second trimester: Secondary | ICD-10-CM

## 2021-05-27 DIAGNOSIS — O2342 Unspecified infection of urinary tract in pregnancy, second trimester: Secondary | ICD-10-CM

## 2021-05-27 DIAGNOSIS — Z1379 Encounter for other screening for genetic and chromosomal anomalies: Secondary | ICD-10-CM

## 2021-05-27 DIAGNOSIS — O0992 Supervision of high risk pregnancy, unspecified, second trimester: Secondary | ICD-10-CM

## 2021-05-27 LAB — POCT URINALYSIS DIPSTICK OB
Blood, UA: NEGATIVE
Glucose, UA: NEGATIVE
Ketones, UA: NEGATIVE
Nitrite, UA: NEGATIVE

## 2021-05-27 NOTE — Progress Notes (Signed)
? ?HIGH-RISK PREGNANCY VISIT ?Patient name: Kathleen Hamilton MRN [id number]  Date of birth: [birth year] ?Chief Complaint:   ?Routine Prenatal Visit ? ?History of Present Illness:   ?Kathleen Hamilton is a 26 y.o. G41P1011 female at 10w0dwith an Estimated Date of Delivery: 11/11/21 being seen today for ongoing management of a high-risk pregnancy complicated by:  ? ?DC/DA twins ?Sickle cell trait ?Recent UTI s/p treatment, []  TOC today   ? ?Today she reports no complaints.  ? ?Contractions: Not present. Vag. Bleeding: None.  Movement: Present. denies leaking of fluid.  ? ? ?  05/06/2021  ? 10:58 AM 07/22/2020  ? 10:41 AM  ?Depression screen PHQ 2/9  ?Decreased Interest 1 0  ?Down, Depressed, Hopeless 0 0  ?PHQ - 2 Score 1 0  ?Altered sleeping 1   ?Tired, decreased energy 1   ?Change in appetite 0   ?Feeling bad or failure about yourself  0   ?Trouble concentrating 0   ?Moving slowly or fidgety/restless 0   ?Suicidal thoughts 0   ?PHQ-9 Score 3   ? ? ? ?Current Outpatient Medications  ?Medication Instructions  ? acetaminophen (TYLENOL) 650 mg, Oral, Every 4 hours PRN  ? cefadroxil (DURICEF) 500 mg, Oral, 2 times daily  ? ondansetron (ZOFRAN ODT) 4 mg, Oral, Every 8 hours PRN  ? Prenatal Vit-Fe Fumarate-FA (PRENATAL VITAMIN PO) Oral  ?  ? ?Review of Systems:   ?Pertinent items are noted in HPI ?Denies abnormal vaginal discharge w/ itching/odor/irritation, headaches, visual changes, shortness of breath, chest pain, abdominal pain, severe nausea/vomiting, or problems with urination or bowel movements unless otherwise stated above. ?Pertinent History Reviewed:  ?Reviewed past medical,surgical, social, obstetrical and family history.  ?Reviewed problem list, medications and allergies. ?Physical Assessment:  ? ?Vitals:  ? 05/27/21 1213  ?BP: 121/79  ?Pulse: 93  ?Weight: 249 lb 12.8 oz (113.3 kg)  ?Body mass index is 41.57 kg/m?. ?     ?     Physical Examination:  ? General appearance: alert, well appearing, and in no distress ? Mental  status: normal mood, behavior, speech, dress, motor activity, and thought processes ? Skin: warm & dry  ? Extremities: Edema: None  ?  Cardiovascular: normal heart rate noted ? Respiratory: normal respiratory effort, no distress ? Abdomen: gravid, soft, non-tender ? Pelvic: Cervical exam deferred        ? ?Fetal Status:     Movement: Present   FHT present 150/155 by ultrasound ? ?Fetal Surveillance Testing today: ultrasound to confirm FHT  ? ?Chaperone: N/A   ? ?Results for orders placed or performed in visit on 05/27/21 (from the past 24 hour(s))  ?POC Urinalysis Dipstick OB  ? Collection Time: 05/27/21 12:34 PM  ?Result Value Ref Range  ? Color, UA    ? Clarity, UA    ? Glucose, UA Negative Negative  ? Bilirubin, UA    ? Ketones, UA neg   ? Spec Grav, UA    ? Blood, UA neg   ? pH, UA    ? POC,PROTEIN,UA Trace Negative, Trace, Small (1+), Moderate (2+), Large (3+), 4+  ? Urobilinogen, UA    ? Nitrite, UA neg   ? Leukocytes, UA Trace (A) Negative  ? Appearance    ? Odor    ?  ? ?Assessment & Plan:  ?High-risk pregnancy: G3P1011 at 140w0dith an Estimated Date of Delivery: 11/11/21  ? ?1) DC/DA twins ?-growth q 4wks ?-anatomy scan next visit ? ?2) Recent UTI ?UA/culture sent ? ?  Meds: No orders of the defined types were placed in this encounter. ? ? ?Labs/procedures today: UA/urine culture, AFP only today ? ?Treatment Plan:  as outlined above ? ?Reviewed: Preterm labor symptoms and general obstetric precautions including but not limited to vaginal bleeding, contractions, leaking of fluid and fetal movement were reviewed in detail with the patient.  All questions were answered.  ? ?Follow-up: Return in about 4 weeks (around 06/24/2021) for HROB visit and anatomy scan- twins. ? ? ?Future Appointments  ?Date Time Provider [place]  ?06/30/2021 10:45 AM [facility] [place] [facility] None  ?06/30/2021  1:30 PM Janyth Pupa, DO [facility]  ? ? ?Orders Placed This Encounter  ?Procedures  ? Urine Culture  ? AFP,  Serum, Open Spina Bifida  ? POC Urinalysis Dipstick OB  ? ? ?Janyth Pupa, DO ?Attending [place], [facility] ?[facility] for [place] ? ? ? ?

## 2021-05-29 LAB — URINE CULTURE

## 2021-06-24 ENCOUNTER — Other Ambulatory Visit: Payer: Self-pay | Admitting: Obstetrics & Gynecology

## 2021-06-24 ENCOUNTER — Encounter: Payer: Self-pay | Admitting: Advanced Practice Midwife

## 2021-06-24 MED ORDER — ESOMEPRAZOLE MAGNESIUM 20 MG PO PACK: 20.0000 mg | PACK | Freq: Every day | ORAL | 12 refills | Status: DC

## 2021-06-25 ENCOUNTER — Other Ambulatory Visit: Payer: Self-pay | Admitting: Obstetrics & Gynecology

## 2021-06-25 DIAGNOSIS — Z363 Encounter for antenatal screening for malformations: Secondary | ICD-10-CM

## 2021-06-25 DIAGNOSIS — O30042 Twin pregnancy, dichorionic/diamniotic, second trimester: Secondary | ICD-10-CM

## 2021-06-29 ENCOUNTER — Telehealth: Payer: Self-pay | Admitting: *Deleted

## 2021-06-29 ENCOUNTER — Other Ambulatory Visit: Payer: Self-pay | Admitting: Women's Health

## 2021-06-29 MED ORDER — FAMOTIDINE 20 MG PO TABS: 20.0000 mg | ORAL_TABLET | Freq: Two times a day (BID) | ORAL | 4 refills | Status: DC

## 2021-06-30 ENCOUNTER — Ambulatory Visit (INDEPENDENT_AMBULATORY_CARE_PROVIDER_SITE_OTHER): Payer: Medicaid Other | Admitting: Obstetrics & Gynecology

## 2021-06-30 ENCOUNTER — Ambulatory Visit (INDEPENDENT_AMBULATORY_CARE_PROVIDER_SITE_OTHER): Payer: Medicaid Other

## 2021-06-30 ENCOUNTER — Encounter: Payer: Self-pay | Admitting: Obstetrics & Gynecology

## 2021-06-30 VITALS — BP 126/83 | HR 97 | Wt 256.2 lb

## 2021-06-30 DIAGNOSIS — Z8759 Personal history of other complications of pregnancy, childbirth and the puerperium: Secondary | ICD-10-CM

## 2021-06-30 DIAGNOSIS — O30042 Twin pregnancy, dichorionic/diamniotic, second trimester: Secondary | ICD-10-CM

## 2021-06-30 DIAGNOSIS — Z363 Encounter for antenatal screening for malformations: Secondary | ICD-10-CM | POA: Diagnosis not present

## 2021-06-30 DIAGNOSIS — O0992 Supervision of high risk pregnancy, unspecified, second trimester: Secondary | ICD-10-CM

## 2021-06-30 DIAGNOSIS — Z3A20 20 weeks gestation of pregnancy: Secondary | ICD-10-CM

## 2021-06-30 LAB — POCT URINALYSIS DIPSTICK OB
Blood, UA: NEGATIVE
Glucose, UA: NEGATIVE
Nitrite, UA: NEGATIVE
POC,PROTEIN,UA: NEGATIVE

## 2021-06-30 NOTE — Progress Notes (Signed)
[place] 20+6 wks,DC/DA TWINS,CX length 4.9 cm,normal ovaries ?BABY A:girl,cephalic right,posterior/fundal placenta,SVP of fluid 5.9 cm,simple right choroid plexus cyst 2.7 x 2.1 mm,FHR 146 bpm,EFW 434 g 80%,anatomy complete ?BABY B: boy,cephalic left,posterior/fundal placenta gr 0,FHR 157 bpm,SVP of fluid 6.1 cm,EFW 388 g 49%,anatomy complete,no obvious abnormalities,discordance 10.6% ?

## 2021-06-30 NOTE — Progress Notes (Signed)
? ?HIGH-RISK PREGNANCY VISIT ?Patient name: Kathleen Hamilton MRN [id number]  Date of birth: 04/21/1995 ?Chief Complaint:   ?High Risk Gestation ([place] today) ? ?History of Present Illness:   ?Kathleen Hamilton is a 26 y.o. G21P1011 female at 25w6dwith an Estimated Date of Delivery: 11/11/21 being seen today for ongoing management of a high-risk pregnancy complicated by: ? ?DC/DA twins ? ?Today she reports no complaints.  ? ?Contractions: Not present. Vag. Bleeding: None.  Movement: Present. denies leaking of fluid.  ? ? ?  05/06/2021  ? 10:58 AM 07/22/2020  ? 10:41 AM  ?Depression screen PHQ 2/9  ?Decreased Interest 1 0  ?Down, Depressed, Hopeless 0 0  ?PHQ - 2 Score 1 0  ?Altered sleeping 1   ?Tired, decreased energy 1   ?Change in appetite 0   ?Feeling bad or failure about yourself  0   ?Trouble concentrating 0   ?Moving slowly or fidgety/restless 0   ?Suicidal thoughts 0   ?PHQ-9 Score 3   ? ? ? ?Current Outpatient Medications  ?Medication Instructions  ? acetaminophen (TYLENOL) 650 mg, Oral, Every 4 hours PRN  ? famotidine (PEPCID) 20 mg, Oral, 2 times daily  ? ondansetron (ZOFRAN ODT) 4 mg, Oral, Every 8 hours PRN  ? Prenatal Vit-Fe Fumarate-FA (PRENATAL VITAMIN PO) Oral  ?  ? ?Review of Systems:   ?Pertinent items are noted in HPI ?Denies abnormal vaginal discharge w/ itching/odor/irritation, headaches, visual changes, shortness of breath, chest pain, abdominal pain, severe nausea/vomiting, or problems with urination or bowel movements unless otherwise stated above. ?Pertinent History Reviewed:  ?Reviewed past medical,surgical, social, obstetrical and family history.  ?Reviewed problem list, medications and allergies. ?Physical Assessment:  ? ?Vitals:  ? 06/30/21 1216  ?BP: 126/83  ?Pulse: 97  ?Weight: 256 lb 3.2 oz (116.2 kg)  ?Body mass index is 42.63 kg/m?. ?     ?     Physical Examination:  ? General appearance: alert, well appearing, and in no distress ? Mental status: normal mood, behavior, speech, dress, motor  activity, and thought processes ? Skin: warm & dry  ? Extremities: Edema: Trace  ?  Cardiovascular: normal heart rate noted ? Respiratory: normal respiratory effort, no distress ? Abdomen: gravid, soft, non-tender ? Pelvic: Cervical exam deferred        ? ?Fetal Status:     Movement: Present   ? ?Fetal Surveillance Testing today: [place]20+6 wks,DC/DA TWINS,CX length 4.9 cm,normal ovaries ?BABY A:girl,cephalic right,posterior/fundal placenta,SVP of fluid 5.9 cm,simple right choroid plexus cyst 2.7 x 2.1 mm,FHR 146 bpm,EFW 434 g 80%,anatomy complete ?BABY B: boy,cephalic left,posterior/fundal placenta gr 0,FHR 157 bpm,SVP of fluid 6.1 cm,EFW 388 g 49%,anatomy complete,no obvious abnormalities,discordance 10.6%  ? ?Chaperone: N/A   ? ?Results for orders placed or performed in visit on 06/30/21 (from the past 24 hour(s))  ?POC Urinalysis Dipstick OB  ? Collection Time: 06/30/21 12:17 PM  ?Result Value Ref Range  ? Color, UA    ? Clarity, UA    ? Glucose, UA Negative Negative  ? Bilirubin, UA    ? Ketones, UA 2+   ? Spec Grav, UA    ? Blood, UA neg   ? pH, UA    ? POC,PROTEIN,UA Negative Negative, Trace, Small (1+), Moderate (2+), Large (3+), 4+  ? Urobilinogen, UA    ? Nitrite, UA neg   ? Leukocytes, UA Trace (A) Negative  ? Appearance    ? Odor    ?  ? ?Assessment & Plan:  ?High-risk  pregnancy: G3P1011 at 4w6dwith an Estimated Date of Delivery: 11/11/21  ? ?1) DC/DA twins ?-reviewed today's [place] ?-continue growth q 4wks ? ?2) SCT ?-repeat UA and culture next visit ? ?Meds: No orders of the defined types were placed in this encounter. ? ? ?Labs/procedures today: none ? ?Treatment Plan:  as outlined above ? ?Reviewed: Preterm labor symptoms and general obstetric precautions including but not limited to vaginal bleeding, contractions, leaking of fluid and fetal movement were reviewed in detail with the patient.  All questions were answered. Pt has home bp cuff. Check bp weekly, let [place] if >140/90.  ? ?Follow-up:  Return in about 4 weeks (around 07/28/2021) for HROB visit and ultrasound growth twins. ? ? ?Future Appointments  ?Date Time Provider [place] ?06/30/2021  1:30 PM OJanyth Pupa DO [facility]  ?08/11/2021  1:30 PM [facility] [place] None  ?08/11/2021  3:10 PM OJanyth Pupa DO [facility]  ? ? ?Orders Placed This Encounter  ?Procedures  ? POC Urinalysis Dipstick OB  ? ? ?JJanyth Pupa DO ?[facility] ?[facility] [place]? ? ? ?

## 2021-07-15 ENCOUNTER — Encounter: Payer: Self-pay | Admitting: Obstetrics & Gynecology

## 2021-07-19 ENCOUNTER — Encounter: Payer: Self-pay | Admitting: Obstetrics & Gynecology

## 2021-07-19 NOTE — Telephone Encounter (Signed)
error 

## 2021-07-20 ENCOUNTER — Telehealth: Payer: Self-pay | Admitting: Obstetrics & Gynecology

## 2021-07-20 ENCOUNTER — Other Ambulatory Visit: Payer: Self-pay

## 2021-07-20 ENCOUNTER — Inpatient Hospital Stay (HOSPITAL_BASED_OUTPATIENT_CLINIC_OR_DEPARTMENT_OTHER): Payer: Medicaid Other

## 2021-07-20 ENCOUNTER — Encounter (HOSPITAL_COMMUNITY): Payer: Self-pay | Admitting: Obstetrics and Gynecology

## 2021-07-20 ENCOUNTER — Inpatient Hospital Stay (HOSPITAL_COMMUNITY)
Admission: AD | Admit: 2021-07-20 | Discharge: 2021-07-20 | Disposition: A | Discharge status: Home / Self Care | Payer: Medicaid Other | Attending: Obstetrics and Gynecology | Admitting: Obstetrics and Gynecology

## 2021-07-20 DIAGNOSIS — Z3A23 23 weeks gestation of pregnancy: Secondary | ICD-10-CM | POA: Insufficient documentation

## 2021-07-20 DIAGNOSIS — O368121 Decreased fetal movements, second trimester, fetus 1: Secondary | ICD-10-CM

## 2021-07-20 DIAGNOSIS — O368122 Decreased fetal movements, second trimester, fetus 2: Secondary | ICD-10-CM | POA: Diagnosis not present

## 2021-07-20 DIAGNOSIS — O09892 Supervision of other high risk pregnancies, second trimester: Secondary | ICD-10-CM | POA: Insufficient documentation

## 2021-07-20 DIAGNOSIS — O30042 Twin pregnancy, dichorionic/diamniotic, second trimester: Secondary | ICD-10-CM

## 2021-07-20 DIAGNOSIS — Z8759 Personal history of other complications of pregnancy, childbirth and the puerperium: Secondary | ICD-10-CM

## 2021-07-20 DIAGNOSIS — Z711 Person with feared health complaint in whom no diagnosis is made: Secondary | ICD-10-CM

## 2021-07-20 DIAGNOSIS — O0992 Supervision of high risk pregnancy, unspecified, second trimester: Secondary | ICD-10-CM

## 2021-07-20 NOTE — MAU Provider Note (Incomplete)
  History     CSN: [id number]  Arrival date and time: 07/20/21 1610   Event Date/Time   First Provider Initiated Contact with Patient 07/20/21 1711      Chief Complaint  Patient presents with  . Decreased Fetal Movement   HPI  {GYN/OB [id number]}  Past Medical History:  Diagnosis Date  . Anxiety   . Asthma   . Dysmenorrhea   . Endometriosis   . Heart murmur   . Pregnancy induced hypertension   . Ulcerative colitis [facility])     Past Surgical History:  Procedure Laterality Date  . BIOPSY BOWEL    . COLON BIOPSY     hx of colon problems.  ? mega colon, awaiting bx results  . NO PAST SURGERIES      Family History  Problem Relation Age of Onset  . Healthy Mother   . Healthy Father   . GI problems Brother   . Diabetes Maternal Grandmother   . Hypertension Maternal Grandmother   . Heart disease Maternal Grandmother   . Breast cancer Maternal Grandmother        unsure age of onset  . Diabetes Paternal Grandmother   . Heart disease Paternal Grandmother   . Hypertension Paternal Grandmother   . Diabetes Paternal Grandfather   . Heart disease Paternal Grandfather   . Hypertension Paternal Grandfather     Social History   Tobacco Use  . Smoking status: Never  . Smokeless tobacco: Never  Vaping Use  . Vaping Use: Never used  Substance Use Topics  . Alcohol use: No  . Drug use: No    Allergies:  Allergies  Allergen Reactions  . Aspirin Other (See Comments)    Reaction:  Stomach bleeding   . Fish Allergy   . Naproxen Other (See Comments)    Reaction:  Stomach bleeding   . Nsaids     GI BLEED  . Shellfish Allergy Diarrhea, Nausea And Vomiting, Swelling and Rash    Medications Prior to Admission  Medication Sig Dispense Refill Last Dose  . acetaminophen (TYLENOL) 325 MG tablet Take 2 tablets (650 mg total) by mouth every 4 (four) hours as needed (for pain scale < 4). 100 tablet 0 07/19/2021  . ondansetron (ZOFRAN ODT) 4 MG disintegrating tablet Take 1 tablet  (4 mg total) by mouth every 8 (eight) hours as needed for nausea or vomiting. 4 tablet 0 Past Month  . Prenatal Vit-Fe Fumarate-FA (PRENATAL VITAMIN PO) Take by mouth.   07/19/2021  . famotidine (PEPCID) 20 MG tablet Take 1 tablet (20 mg total) by mouth 2 (two) times daily. (Patient not taking: Reported on 06/30/2021) 30 tablet 4     Review of Systems Physical Exam   Blood pressure 127/75, pulse 99, temperature 98.4 F (36.9 C), temperature source Oral, resp. rate 16, height 5' 5"  (1.651 m), weight 117.5 kg, last menstrual period 02/04/2021, SpO2 97 %.  Physical Exam  MAU Course  Procedures  MDM ***  Assessment and Plan  ***  Kathleen Hamilton 07/20/2021, 5:11 PM

## 2021-07-20 NOTE — MAU Note (Signed)
...  Kathleen Hamilton is a 26 y.o. at 38w5dhere in MAU reporting: DFM since this past Sunday of baby B. She reports she is feeling Baby A move consistently and reports "she moves like she is in cycling class." She reports she has only felt two movements from Baby B on her left side. She is feeling all of her movements on her right side.  Pain score: Denies pain.  FHT: twin gestation, unable to [facility] to [facility] in triage.

## 2021-07-20 NOTE — MAU Provider Note (Incomplete)
History     CSN: [id number]  Arrival date and time: 07/20/21 1610   Event Date/Time   First Provider Initiated Contact with Patient 07/20/21 1711      Chief Complaint  Patient presents with   Decreased Fetal Movement   [place], a  26 y.o. G3P1011 at 27w5dpresents to MAU, decreased fetal movement on the left side since 2 days ago. Last [place] patient states that they were side by side. A on the tight baby B on the left. Denies VB LOK cramping abnormal discharge. No pain, does not endorse a large fetal movement.         OB History     Gravida  3   Para  1   Term  1   Preterm      AB  1   Living  1      SAB  1   IAB      Ectopic      Multiple  0   Live Births  1           Past Medical History:  Diagnosis Date   Anxiety    Asthma    Dysmenorrhea    Endometriosis    Heart murmur    Pregnancy induced hypertension    [place]     Past Surgical History:  Procedure Laterality Date   BIOPSY BOWEL     COLON BIOPSY     hx of colon problems.  ? mega colon, awaiting bx results   NO PAST SURGERIES      Family History  Problem Relation Age of Onset   Healthy Mother    Healthy Father    GI problems Brother    Diabetes Maternal Grandmother    Hypertension Maternal Grandmother    Heart disease Maternal Grandmother    Breast cancer Maternal Grandmother        unsure age of onset   Diabetes Paternal Grandmother    Heart disease Paternal Grandmother    Hypertension Paternal Grandmother    Diabetes Paternal Grandfather    Heart disease Paternal Grandfather    Hypertension Paternal Grandfather     Social History   Tobacco Use   Smoking status: Never   Smokeless tobacco: Never  Vaping Use   Vaping Use: Never used  Substance Use Topics   Alcohol use: No   Drug use: No    Allergies:  Allergies  Allergen Reactions   Aspirin Other (See Comments)    Reaction:  Stomach bleeding    Fish Allergy    Naproxen Other  (See Comments)    Reaction:  Stomach bleeding    Nsaids     GI BLEED   Shellfish Allergy Diarrhea, Nausea And Vomiting, Swelling and Rash    Medications Prior to Admission  Medication Sig Dispense Refill Last Dose   acetaminophen (TYLENOL) 325 MG tablet Take 2 tablets (650 mg total) by mouth every 4 (four) hours as needed (for pain scale < 4). 100 tablet 0 07/19/2021   ondansetron (ZOFRAN ODT) 4 MG disintegrating tablet Take 1 tablet (4 mg total) by mouth every 8 (eight) hours as needed for nausea or vomiting. 4 tablet 0 Past Month   Prenatal Vit-Fe Fumarate-FA (PRENATAL VITAMIN PO) Take by mouth.   07/19/2021   famotidine (PEPCID) 20 MG tablet Take 1 tablet (20 mg total) by mouth 2 (two) times daily. (Patient not taking: Reported on 06/30/2021) 30 tablet 4     Review  of Systems Physical Exam   Blood pressure 127/75, pulse 99, temperature 98.4 F (36.9 C), temperature source Oral, resp. rate 16, height 5' 5"  (1.651 m), weight 117.5 kg, last menstrual period 02/04/2021, SpO2 97 %.  Physical Exam  MAU Course  Procedures  MDM ***  Assessment and Plan  ***  Kathleen Hamilton 07/20/2021, 5:15 PM

## 2021-08-10 ENCOUNTER — Other Ambulatory Visit: Payer: Self-pay | Admitting: Obstetrics & Gynecology

## 2021-08-10 DIAGNOSIS — O30042 Twin pregnancy, dichorionic/diamniotic, second trimester: Secondary | ICD-10-CM

## 2021-08-10 DIAGNOSIS — Z363 Encounter for antenatal screening for malformations: Secondary | ICD-10-CM

## 2021-08-11 ENCOUNTER — Ambulatory Visit (INDEPENDENT_AMBULATORY_CARE_PROVIDER_SITE_OTHER): Payer: Medicaid Other

## 2021-08-11 ENCOUNTER — Ambulatory Visit (INDEPENDENT_AMBULATORY_CARE_PROVIDER_SITE_OTHER): Payer: Medicaid Other | Admitting: Obstetrics & Gynecology

## 2021-08-11 ENCOUNTER — Encounter: Payer: Self-pay | Admitting: Obstetrics & Gynecology

## 2021-08-11 VITALS — BP 121/75 | HR 96 | Wt 262.0 lb

## 2021-08-11 DIAGNOSIS — O0992 Supervision of high risk pregnancy, unspecified, second trimester: Secondary | ICD-10-CM

## 2021-08-11 DIAGNOSIS — O30042 Twin pregnancy, dichorionic/diamniotic, second trimester: Secondary | ICD-10-CM | POA: Diagnosis not present

## 2021-08-11 DIAGNOSIS — Z3A26 26 weeks gestation of pregnancy: Secondary | ICD-10-CM | POA: Diagnosis not present

## 2021-08-11 DIAGNOSIS — Z8759 Personal history of other complications of pregnancy, childbirth and the puerperium: Secondary | ICD-10-CM

## 2021-08-11 NOTE — Progress Notes (Signed)
HIGH-RISK PREGNANCY VISIT Patient name: Kathleen Hamilton MRN [id number]  Date of birth: April 10, 1995 Chief Complaint:   Routine Prenatal Visit  History of Present Illness:   Kathleen Hamilton is a 26 y.o. G38P1011 female at 22w6dwith an Estimated Date of Delivery: 11/11/21 being seen today for ongoing management of a high-risk pregnancy complicated by: DC/DA twins.    Today she reports occasional contractions.   Contractions: Not present. Vag. Bleeding: None.  Movement: Present. denies leaking of fluid.      05/06/2021   10:58 AM 07/22/2020   10:41 AM  Depression screen PHQ 2/9  Decreased Interest 1 0  Down, Depressed, Hopeless 0 0  PHQ - 2 Score 1 0  Altered sleeping 1   Tired, decreased energy 1   Change in appetite 0   Feeling bad or failure about yourself  0   Trouble concentrating 0   Moving slowly or fidgety/restless 0   Suicidal thoughts 0   PHQ-9 Score 3      Current Outpatient Medications  Medication Instructions   acetaminophen (TYLENOL) 650 mg, Oral, Every 4 hours PRN   famotidine (PEPCID) 20 mg, Oral, 2 times daily   ondansetron (ZOFRAN ODT) 4 mg, Oral, Every 8 hours PRN   Prenatal Vit-Fe Fumarate-FA (PRENATAL VITAMIN PO) Oral     Review of Systems:   Pertinent items are noted in HPI Denies abnormal vaginal discharge w/ itching/odor/irritation, headaches, visual changes, shortness of breath, chest pain, abdominal pain, severe nausea/vomiting, or problems with urination or bowel movements unless otherwise stated above. Pertinent History Reviewed:  Reviewed past medical,surgical, social, obstetrical and family history.  Reviewed problem list, medications and allergies. Physical Assessment:   Vitals:   08/11/21 1431  BP: 121/75  Pulse: 96  Weight: 262 lb (118.8 kg)  Body mass index is 43.6 kg/m.           Physical Examination:   General appearance: alert, well appearing, and in no distress  Mental status: normal mood, behavior, speech, dress, motor activity,  and thought processes  Skin: warm & dry   Extremities: Edema: Trace    Cardiovascular: normal heart rate noted  Respiratory: normal respiratory effort, no distress  Abdomen: gravid, soft, non-tender  Pelvic: Cervical exam deferred         Fetal Status:     Movement: Present    Fetal Surveillance Testing today: [place]26+6 wks,DC/DA twins,CX 4.6 cm BABY A: female breech right,posterior fundal placenta gr 0,SVP of fluid 4 cm,FHR 152 bpm,resolved CPC,EFW 933 g 23% BABY B: female,superior transverse head left,posterior fundal placenta gr 0,SVP of fluid 5.8 cm,FHR 142 bpm,efw 925 g 21%,discordance .8%   Chaperone: N/A    No results found for this or any previous visit (from the past 24 hour(s)).   Assessment & Plan:  High-risk pregnancy: G3P1011 at 278w6dith an Estimated Date of Delivery: 11/11/21   1) DC/DA twins -growth AGA x 2, discordance appropriate -briefly discussed route of delivery pending fetal presentation -discussed tubal ligation, she does not want another child; however, not sure about sterilization -plans to review contraception on her own, []  follow up next visit  2) Sickle cell trait []  UCx next visit  Meds: No orders of the defined types were placed in this encounter.  Labs/procedures today: growth scan  Treatment Plan:  continue growth q 4wks, PN2 to be scheduled at next available  Reviewed: Preterm labor symptoms and general obstetric precautions including but not limited to vaginal bleeding, contractions, leaking of fluid and  fetal movement were reviewed in detail with the patient.  All questions were answered. Pt has home bp cuff. Check bp weekly, let us know if >140/90.   Follow-up: Return in about 4 weeks (around 09/08/2021) for HROB visit and growth scan if possible Dr. Elonda Husky, needs PN2 now (please schedule as separate appt).   Future Appointments  Date Time Provider [place]  08/11/2021  3:10 PM Janyth Pupa, DO [facility]  08/20/2021  9:35 AM  [facility]  09/08/2021 11:15 AM [facility] [place] [facility] None    Janyth Pupa, DO Attending [place], [place] for [employer] [employer]

## 2021-08-11 NOTE — Progress Notes (Signed)
[place] 26+6 wks,DC/DA twins,CX 4.6 cm BABY A: female breech right,posterior fundal placenta gr 0,SVP of fluid 4 cm,FHR 152 bpm,resolved CPC,EFW 933 g 23% BABY B: female,superior transverse head left,posterior fundal placenta gr 0,SVP of fluid 5.8 cm,FHR 142 bpm,efw 925 g 21%,discordance .8%

## 2021-08-15 ENCOUNTER — Ambulatory Visit
Admission: EM | Admit: 2021-08-15 | Discharge: 2021-08-15 | Disposition: A | Discharge status: Home / Self Care | Payer: Medicaid Other | Attending: Internal Medicine | Admitting: Internal Medicine

## 2021-08-15 ENCOUNTER — Encounter: Payer: Self-pay | Admitting: Emergency Medicine

## 2021-08-15 DIAGNOSIS — T63301A Toxic effect of unspecified spider venom, accidental (unintentional), initial encounter: Secondary | ICD-10-CM

## 2021-08-15 DIAGNOSIS — L03113 Cellulitis of right upper limb: Secondary | ICD-10-CM

## 2021-08-15 MED ORDER — CEPHALEXIN 500 MG PO CAPS: 500.0000 mg | ORAL_CAPSULE | Freq: Four times a day (QID) | ORAL | 0 refills | Status: AC

## 2021-08-15 NOTE — ED Provider Notes (Signed)
[facility]    CSN: [id number] Arrival date & time: 08/15/21  1446      History   Chief Complaint Chief Complaint  Patient presents with   Insect Bite    HPI Kathleen Hamilton is a 26 y.o. female.   Patient presents with possible spider bite to right forearm that she noticed about a week ago.  Patient reports that she noticed a spider crawling on her but did not see or feel it bite her.  Denies any purulent drainage from the area but does report that the redness and swelling has been increasing since it occurred.  She has applied hydrocortisone cream with no improvement.  Denies fever, body aches, chills, nausea, vomiting.  She is currently [redacted] weeks pregnant.  She states that her last tetanus vaccine was about 3 years ago.     Past Medical History:  Diagnosis Date   Anxiety    Asthma    Dysmenorrhea    Endometriosis    Heart murmur    Pregnancy induced hypertension    Ulcerative colitis [facility])     Patient Active Problem List   Diagnosis Date Noted   Anxiety 05/06/2021   Supervision of high-risk pregnancy 05/06/2021   History of gestational hypertension 05/06/2021   Dichorionic diamniotic twin gestation 05/06/2021   History of prior pregnancy with IUGR newborn 05/06/2021   Sickle cell trait ([place]) 05/06/2021   VISUAL ACUITY, DECREASED 04/19/2010   OVERWEIGHT 04/15/2010   DYSMENORRHEA 04/15/2010   MENORRHAGIA 04/15/2010    Past Surgical History:  Procedure Laterality Date   BIOPSY BOWEL     COLON BIOPSY     hx of colon problems.  ? mega colon, awaiting bx results   NO PAST SURGERIES      OB History     Gravida  3   Para  1   Term  1   Preterm      AB  1   Living  1      SAB  1   IAB      Ectopic      Multiple  0   Live Births  1            Home Medications    Prior to Admission medications   Medication Sig Start Date End Date Taking? Authorizing Provider  cephALEXin (KEFLEX) 500 MG capsule Take 1 capsule (500 mg total)  by mouth 4 (four) times daily for 5 days. 08/15/21 08/20/21 Yes , Michele Rockers, FNP  acetaminophen (TYLENOL) 325 MG tablet Take 2 tablets (650 mg total) by mouth every 4 (four) hours as needed (for pain scale < 4). 07/13/18   Sigmon, Tyler Deis, CNM  famotidine (PEPCID) 20 MG tablet Take 1 tablet (20 mg total) by mouth 2 (two) times daily. 06/29/21   Roma Schanz, CNM  ondansetron (ZOFRAN ODT) 4 MG disintegrating tablet Take 1 tablet (4 mg total) by mouth every 8 (eight) hours as needed for nausea or vomiting. Patient not taking: Reported on 08/11/2021 06/02/20   Arnaldo Natal, MD  Prenatal Vit-Fe Fumarate-FA (PRENATAL VITAMIN PO) Take by mouth.    [provider]  hydrochlorothiazide (HYDRODIURIL) 25 MG tablet Take 1 tablet (25 mg total) by mouth daily. Patient not taking: Reported on 08/27/2018 07/13/18 08/27/18  Darliss Cheney, CNM  iron polysaccharides (NIFEREX) 150 MG capsule Take 1 capsule (150 mg total) by mouth daily. Patient not taking: Reported on 08/27/2018 07/14/18 08/27/18  Darliss Cheney, CNM  Family History Family History  Problem Relation Age of Onset   Healthy Mother    Healthy Father    GI problems Brother    Diabetes Maternal Grandmother    Hypertension Maternal Grandmother    Heart disease Maternal Grandmother    Breast cancer Maternal Grandmother        unsure age of onset   Diabetes Paternal Grandmother    Heart disease Paternal Grandmother    Hypertension Paternal Grandmother    Diabetes Paternal Grandfather    Heart disease Paternal Grandfather    Hypertension Paternal Grandfather     Social History Social History   Tobacco Use   Smoking status: Never   Smokeless tobacco: Never  Vaping Use   Vaping Use: Never used  Substance Use Topics   Alcohol use: No   Drug use: No     Allergies   Aspirin, Fish allergy, Naproxen, Nsaids, and Shellfish allergy   Review of Systems Review of Systems Per HPI  Physical Exam Triage Vital Signs ED  Triage Vitals  Enc Vitals Group     BP 08/15/21 1511 108/78     Pulse Rate 08/15/21 1511 81     Resp 08/15/21 1511 18     Temp 08/15/21 1511 98.2 F (36.8 C)     Temp Source 08/15/21 1511 Oral     SpO2 08/15/21 1511 96 %     Weight 08/15/21 1513 262 lb (118.8 kg)     Height 08/15/21 1513 5' 5"  (1.651 m)     Head Circumference --      Peak Flow --      Pain Score 08/15/21 1513 0     Pain Loc --      Pain Edu? --      Excl. in [place]? --    No data found.  Updated Vital Signs BP 108/78 (BP Location: Left Arm)   Pulse 81   Temp 98.2 F (36.8 C) (Oral)   Resp 18   Ht 5' 5"  (1.651 m)   Wt 262 lb (118.8 kg)   LMP 02/04/2021   SpO2 96%   BMI 43.60 kg/m   Visual Acuity Right Eye Distance:   Left Eye Distance:   Bilateral Distance:    Right Eye Near:   Left Eye Near:    Bilateral Near:     Physical Exam Constitutional:      General: She is not in acute distress.    Appearance: Normal appearance. She is not toxic-appearing or diaphoretic.  HENT:     Head: Normocephalic and atraumatic.  Eyes:     Extraocular Movements: Extraocular movements intact.     Conjunctiva/sclera: Conjunctivae normal.  Pulmonary:     Effort: Pulmonary effort is normal.  Skin:    Comments: Approximately 2 to 3 inch in diameter circular, indurated erythematous area present to distal right forearm.  No obvious purulent drainage.  Patient has full range of motion of arm.  Grip strength 5/5.  Neurovascular intact.  No black discoloration.  Neurological:     General: No focal deficit present.     Mental Status: She is alert and oriented to person, place, and time. Mental status is at baseline.  Psychiatric:        Mood and Affect: Mood normal.        Behavior: Behavior normal.        Thought Content: Thought content normal.        Judgment: Judgment normal.      UC  Treatments / Results  Labs (all labs ordered are listed, but only abnormal results are displayed) Labs Reviewed - No data to  display  EKG   Radiology No results found.  Procedures Procedures (including critical care time)  Medications Ordered in UC Medications - No data to display  Initial Impression / Assessment and Plan / UC Course  I have reviewed the triage vital signs and the nursing notes.  Pertinent labs & imaging results that were available during my care of the patient were reviewed by me and considered in my medical decision making (see chart for details).     Suspicious of spider bite to right forearm.  Patient has increasing erythema and induration so there is concern for infection.  Will treat with cephalexin antibiotic as this is safe with pregnancy.  Patient's tetanus vaccine is up-to-date.  Patient advised to monitor for signs of worsening infection or necrosis.  There is no concern for systemic complications so will defer blood work at this time.  Discussed return precautions.  Patient verbalized understanding and was agreeable with plan. Final Clinical Impressions(s) / UC Diagnoses   Final diagnoses:  Spider bite wound, accidental or unintentional, initial encounter  Cellulitis of right arm     Discharge Instructions      Spider bite is concerning for infection.  You have been prescribed antibiotic.  This is safe with pregnancy.  Also recommend cool compresses.  Follow-up if any black discoloration or any increased symptoms develop.  Also recommend that you follow-up if fever, body aches, chills develop.    ED Prescriptions     Medication Sig Dispense Auth. Provider   cephALEXin (KEFLEX) 500 MG capsule Take 1 capsule (500 mg total) by mouth 4 (four) times daily for 5 days. 20 capsule Teodora Medici, [place]      PDMP not reviewed this encounter.   Teodora Medici, [place] 08/15/21 [phone]

## 2021-08-15 NOTE — ED Triage Notes (Signed)
Patient states that she has a spider bite on right forearm x 1 week.  Redness, itching, applied hydrocortisone cream.

## 2021-08-15 NOTE — Discharge Instructions (Signed)
Spider bite is concerning for infection.  You have been prescribed antibiotic.  This is safe with pregnancy.  Also recommend cool compresses.  Follow-up if any black discoloration or any increased symptoms develop.  Also recommend that you follow-up if fever, body aches, chills develop.

## 2021-08-20 ENCOUNTER — Other Ambulatory Visit: Payer: Medicaid Other

## 2021-08-20 DIAGNOSIS — Z131 Encounter for screening for diabetes mellitus: Secondary | ICD-10-CM

## 2021-08-20 DIAGNOSIS — Z3A27 27 weeks gestation of pregnancy: Secondary | ICD-10-CM

## 2021-08-20 DIAGNOSIS — O0992 Supervision of high risk pregnancy, unspecified, second trimester: Secondary | ICD-10-CM

## 2021-08-21 LAB — HIV ANTIBODY (ROUTINE TESTING W REFLEX): HIV Screen 4th Generation wRfx: NONREACTIVE

## 2021-08-21 LAB — CBC
Hematocrit: 36.7 % (ref 34.0–46.6)
Hemoglobin: 12.2 g/dL (ref 11.1–15.9)
MCH: 31.3 pg (ref 26.6–33.0)
MCHC: 33.2 g/dL (ref 31.5–35.7)
MCV: 94 fL (ref 79–97)
Platelets: 311 10*3/uL (ref 150–450)
RBC: 3.9 x10E6/uL (ref 3.77–5.28)
RDW: 13 % (ref 11.7–15.4)
WBC: 11.3 10*3/uL — ABNORMAL HIGH (ref 3.4–10.8)

## 2021-08-21 LAB — ANTIBODY SCREEN: Antibody Screen: NEGATIVE

## 2021-08-21 LAB — GLUCOSE TOLERANCE, 2 HOURS W/ 1HR
Glucose, 1 hour: 145 mg/dL (ref 70–179)
Glucose, 2 hour: 113 mg/dL (ref 70–152)
Glucose, Fasting: 77 mg/dL (ref 70–91)

## 2021-08-21 LAB — RPR: RPR Ser Ql: NONREACTIVE

## 2021-08-25 ENCOUNTER — Encounter: Payer: Self-pay | Admitting: Obstetrics & Gynecology

## 2021-09-06 ENCOUNTER — Encounter: Payer: Self-pay | Admitting: Obstetrics & Gynecology

## 2021-09-07 ENCOUNTER — Other Ambulatory Visit: Payer: Self-pay | Admitting: Obstetrics & Gynecology

## 2021-09-07 DIAGNOSIS — O30042 Twin pregnancy, dichorionic/diamniotic, second trimester: Secondary | ICD-10-CM

## 2021-09-08 ENCOUNTER — Ambulatory Visit (INDEPENDENT_AMBULATORY_CARE_PROVIDER_SITE_OTHER): Payer: Medicaid Other | Admitting: Obstetrics & Gynecology

## 2021-09-08 ENCOUNTER — Ambulatory Visit (INDEPENDENT_AMBULATORY_CARE_PROVIDER_SITE_OTHER): Payer: Medicaid Other

## 2021-09-08 ENCOUNTER — Encounter: Payer: Self-pay | Admitting: Obstetrics & Gynecology

## 2021-09-08 VITALS — BP 106/62 | HR 80

## 2021-09-08 DIAGNOSIS — Z23 Encounter for immunization: Secondary | ICD-10-CM | POA: Diagnosis not present

## 2021-09-08 DIAGNOSIS — Z3A30 30 weeks gestation of pregnancy: Secondary | ICD-10-CM | POA: Diagnosis not present

## 2021-09-08 DIAGNOSIS — O0993 Supervision of high risk pregnancy, unspecified, third trimester: Secondary | ICD-10-CM | POA: Diagnosis not present

## 2021-09-08 DIAGNOSIS — D573 Sickle-cell trait: Secondary | ICD-10-CM

## 2021-09-08 DIAGNOSIS — O30042 Twin pregnancy, dichorionic/diamniotic, second trimester: Secondary | ICD-10-CM

## 2021-09-08 DIAGNOSIS — O0992 Supervision of high risk pregnancy, unspecified, second trimester: Secondary | ICD-10-CM

## 2021-09-08 DIAGNOSIS — O30043 Twin pregnancy, dichorionic/diamniotic, third trimester: Secondary | ICD-10-CM

## 2021-09-08 DIAGNOSIS — F419 Anxiety disorder, unspecified: Secondary | ICD-10-CM

## 2021-09-08 DIAGNOSIS — Z8759 Personal history of other complications of pregnancy, childbirth and the puerperium: Secondary | ICD-10-CM

## 2021-09-08 MED ORDER — FLUOXETINE HCL 20 MG PO CAPS: 20.0000 mg | ORAL_CAPSULE | Freq: Every day | ORAL | 3 refills | Status: DC

## 2021-09-08 NOTE — Progress Notes (Signed)
HIGH-RISK PREGNANCY VISIT Patient name: Kathleen Hamilton MRN [id number]  Date of birth: 1995/09/08 Chief Complaint:   Routine Prenatal Visit  History of Present Illness:   Kathleen Hamilton is a 26 y.o. G7P1011 female at 62w6dwith an Estimated Date of Delivery: 11/11/21 being seen today for ongoing management of a high-risk pregnancy complicated by:  DC/DA twins Sickle cell trait.    Today she reports no complaints.   Contractions: Irritability. Vag. Bleeding: None.  Movement: Present. denies leaking of fluid.      05/06/2021   10:58 AM 07/22/2020   10:41 AM  Depression screen PHQ 2/9  Decreased Interest 1 0  Down, Depressed, Hopeless 0 0  PHQ - 2 Score 1 0  Altered sleeping 1   Tired, decreased energy 1   Change in appetite 0   Feeling bad or failure about yourself  0   Trouble concentrating 0   Moving slowly or fidgety/restless 0   Suicidal thoughts 0   PHQ-9 Score 3      Current Outpatient Medications  Medication Instructions   acetaminophen (TYLENOL) 650 mg, Oral, Every 4 hours PRN   famotidine (PEPCID) 20 mg, Oral, 2 times daily   FLUoxetine (PROZAC) 20 mg, Oral, Daily   ondansetron (ZOFRAN ODT) 4 mg, Oral, Every 8 hours PRN   Prenatal Vit-Fe Fumarate-FA (PRENATAL VITAMIN PO) Oral     Review of Systems:   Pertinent items are noted in HPI Denies abnormal vaginal discharge w/ itching/odor/irritation, headaches, visual changes, shortness of breath, chest pain, abdominal pain, severe nausea/vomiting, or problems with urination or bowel movements unless otherwise stated above. Pertinent History Reviewed:  Reviewed past medical,surgical, social, obstetrical and family history.  Reviewed problem list, medications and allergies. Physical Assessment:   Vitals:   09/08/21 1211  BP: 106/62  Pulse: 80  There is no height or weight on file to calculate BMI.           Physical Examination:   General appearance: alert, well appearing, and in no distress  Mental status:  normal mood, behavior, speech, dress, motor activity, and thought processes  Skin: warm & dry   Extremities: Edema: Trace    Cardiovascular: normal heart rate noted  Respiratory: normal respiratory effort, no distress  Abdomen: gravid, soft, non-tender  Pelvic: Cervical exam deferred         Fetal Status:     Movement: Present    Fetal Surveillance Testing today: BABY A:female,complete breech right,posterior fundal placenta gr 0,SVP of fluid 5 cm,FHR 138 bpm,EFW 1456 g 12%,discordance 3.1% BABY B:female,cephalic left ,posterior fundal placenta gr 0,FHR 142 bpm,SVP of fluid 5.4 cm,EFW 1503 g 16.7%   Chaperone: N/A    No results found for this or any previous visit (from the past 24 hour(s)).   Assessment & Plan:  High-risk pregnancy: G3P1011 at 361w6dith an Estimated Date of Delivery: 11/11/21   1) DC/DA twins -growth as above, will continue to follow monthly -BPP weekly @ 36wks -based on current fetal presentation, plan for C-section @ 38wks  2) SCT and h/o UTI -repeat UA/culture every trimester, []  due next visit  3) Anxiety -previously on prozac and wishes to restart -noted significant postpartum depression and is concerned as she has already started to note worsening anxiety   Meds:  Meds ordered this encounter  Medications   FLUoxetine (PROZAC) 20 MG capsule    Sig: Take 1 capsule (20 mg total) by mouth daily.    Dispense:  30 capsule    Refill:  3    Labs/procedures today: growth scan, Tdap  Treatment Plan:  as outlined above  Reviewed: Preterm labor symptoms and general obstetric precautions including but not limited to vaginal bleeding, contractions, leaking of fluid and fetal movement were reviewed in detail with the patient.  All questions were answered.   Follow-up: Return in about 2 weeks (around 09/22/2021) for [place] visit, continue q 4wk [place] growth twins.   Future Appointments  Date Time Provider [place]  09/08/2021  1:30 PM Janyth Pupa, DO  [facility]    Orders Placed This Encounter  Procedures   Urine Culture   Tdap vaccine greater than or equal to 7yo IM    Janyth Pupa, DO Attending [place], [facility] for [facility], [place]

## 2021-09-08 NOTE — Progress Notes (Signed)
[place] 30+6 wks,DC/DA twins BABY A:female,complete breech right,posterior fundal placenta gr 0,SVP of fluid 5 cm,FHR 138 bpm,EFW 1456 g 12%,discordance 3.1% BABY B:female,cephalic left ,posterior fundal placenta gr 0,FHR 142 bpm,SVP of fluid 5.4 cm,EFW 1503 g 16.7%

## 2021-09-10 LAB — URINE CULTURE

## 2021-09-12 IMAGING — NM NM HEPATO W/GB/PHARM/[PERSON_NAME]
2 series · 12 of 12 positions shown · non-contrast
Comparison: None.

CLINICAL DATA: Nausea, vomiting, intermittent epigastric abdominal
pain

EXAM:
NUCLEAR MEDICINE HEPATOBILIARY IMAGING WITH GALLBLADDER EF
TECHNIQUE: Sequential images of the abdomen were obtained [DATE] minutes
following intravenous administration of radiopharmaceutical. After
oral ingestion of Ensure, gallbladder ejection fraction was
determined. At 60 min, normal ejection fraction is greater than 33%.
RADIOPHARMACEUTICALS:  5.0 mCi 2c-GGm  Choletec IV

[Series 1: hida scan · 3.28mm/px · 6 of 60 frames shown]
[frame 6/60]
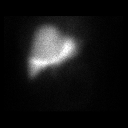
[frame 16/60]
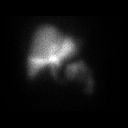
[frame 26/60]
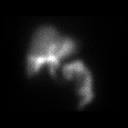
[frame 36/60]
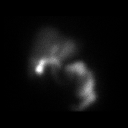
[frame 46/60]
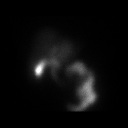
[frame 56/60]
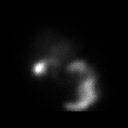

[Series 1: gbef ensure · 3.28mm/px · 6 of 60 frames shown]
[frame 6/60]
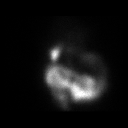
[frame 16/60]
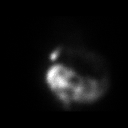
[frame 26/60]
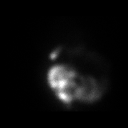
[frame 36/60]
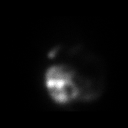
[frame 46/60]
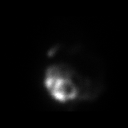
[frame 56/60]
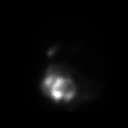

[12 of 12 positions shown; findings below may reference images not displayed]

FINDINGS: Prompt uptake and biliary excretion of activity by the liver is
seen. Gallbladder activity is visualized, consistent with patency of
cystic duct. Biliary activity passes into small bowel, consistent
with patent common bile duct.

Calculated gallbladder ejection fraction is 60%. (Normal gallbladder
ejection fraction with Ensure is greater than 33%.)
IMPRESSION: Normal examination.

## 2021-09-18 ENCOUNTER — Inpatient Hospital Stay (HOSPITAL_COMMUNITY)
Admission: AD | Admit: 2021-09-18 | Discharge: 2021-09-19 | Disposition: A | Discharge status: Home / Self Care | Payer: Medicaid Other | Attending: Family Medicine | Admitting: Family Medicine

## 2021-09-18 ENCOUNTER — Encounter (HOSPITAL_COMMUNITY): Payer: Self-pay | Admitting: Family Medicine

## 2021-09-18 DIAGNOSIS — O26893 Other specified pregnancy related conditions, third trimester: Secondary | ICD-10-CM | POA: Diagnosis not present

## 2021-09-18 DIAGNOSIS — Z3A32 32 weeks gestation of pregnancy: Secondary | ICD-10-CM | POA: Insufficient documentation

## 2021-09-18 DIAGNOSIS — O99891 Other specified diseases and conditions complicating pregnancy: Secondary | ICD-10-CM | POA: Diagnosis present

## 2021-09-18 DIAGNOSIS — O30043 Twin pregnancy, dichorionic/diamniotic, third trimester: Secondary | ICD-10-CM | POA: Insufficient documentation

## 2021-09-18 DIAGNOSIS — Z0371 Encounter for suspected problem with amniotic cavity and membrane ruled out: Secondary | ICD-10-CM

## 2021-09-18 DIAGNOSIS — M549 Dorsalgia, unspecified: Secondary | ICD-10-CM | POA: Insufficient documentation

## 2021-09-18 LAB — URINALYSIS, ROUTINE W REFLEX MICROSCOPIC
Bilirubin Urine: NEGATIVE
Glucose, UA: NEGATIVE mg/dL
Hgb urine dipstick: NEGATIVE
Ketones, ur: 5 mg/dL — AB
Nitrite: NEGATIVE
Protein, ur: NEGATIVE mg/dL
Specific Gravity, Urine: 1.013 (ref 1.005–1.030)
pH: 5 (ref 5.0–8.0)

## 2021-09-18 LAB — WET PREP, GENITAL
Clue Cells Wet Prep HPF POC: NONE SEEN
Sperm: NONE SEEN
Trich, Wet Prep: NONE SEEN
WBC, Wet Prep HPF POC: >=10 — AB (ref <10)
Yeast Wet Prep HPF POC: NONE SEEN

## 2021-09-18 LAB — AMNISURE RUPTURE OF MEMBRANE (ROM) NOT AT ARMC: Amnisure ROM: NEGATIVE

## 2021-09-18 MED ORDER — NIFEDIPINE 10 MG PO CAPS
10.0000 mg | ORAL_CAPSULE | ORAL | Status: AC
  Administered 2021-09-18 (×3): 10 mg via ORAL
  Filled 2021-09-18 (×3): qty 1

## 2021-09-18 MED ORDER — CYCLOBENZAPRINE HCL 5 MG PO TABS
10.0000 mg | ORAL_TABLET | Freq: Once | ORAL | Status: AC
Start: 2021-09-18 — End: 2021-09-18
  Administered 2021-09-18: 10 mg via ORAL
  Filled 2021-09-18: qty 2

## 2021-09-18 MED ORDER — CYCLOBENZAPRINE HCL 10 MG PO TABS: 10.0000 mg | ORAL_TABLET | Freq: Two times a day (BID) | ORAL | 0 refills | Status: DC | PRN

## 2021-09-18 NOTE — Discharge Instructions (Signed)
The tests done today shows no vaginal infection and that your membranes have not broken.

## 2021-09-18 NOTE — MAU Provider Note (Signed)
History     CSN: [id number]  Arrival date and time: 09/18/21 2133   Event Date/Time   First Provider Initiated Contact with Patient 09/18/21 2237      Chief Complaint  Patient presents with   Contractions   Rupture of Membranes   Vaginal Discharge   [name] is a 26 y.o. year old G58P1011 female at 27w2dweeks gestation who presents to MAU reporting increased watery vaginal discharge throughout the day. She reports her underwear stayed wet even with a pantiliner. She also reports BH ctxs every hour radiating in the lower back and increased pressure; rated 6/10. She denies VB. Her pregnancy is complicated with Di-Di twins. She receives [facility]; next appt is 09/22/2021. The FOB is present and contributing to the history taking.    OB History     Gravida  3   Para  1   Term  1   Preterm      AB  1   Living  1      SAB  1   IAB      Ectopic      Multiple  0   Live Births  1           Past Medical History:  Diagnosis Date   Anxiety    Asthma    Dysmenorrhea    Endometriosis    Heart murmur    Pregnancy induced hypertension    Ulcerative colitis ([place]     Past Surgical History:  Procedure Laterality Date   BIOPSY BOWEL     COLON BIOPSY     hx of colon problems.  ? mega colon, awaiting bx results   NO PAST SURGERIES      Family History  Problem Relation Age of Onset   Healthy Mother    Healthy Father    GI problems Brother    Diabetes Maternal Grandmother    Hypertension Maternal Grandmother    Heart disease Maternal Grandmother    Breast cancer Maternal Grandmother        unsure age of onset   Diabetes Paternal Grandmother    Heart disease Paternal Grandmother    Hypertension Paternal Grandmother    Diabetes Paternal Grandfather    Heart disease Paternal Grandfather    Hypertension Paternal Grandfather     Social History   Tobacco Use   Smoking status: Never   Smokeless tobacco: Never  Vaping Use   Vaping Use:  Never used  Substance Use Topics   Alcohol use: No   Drug use: No    Allergies:  Allergies  Allergen Reactions   Aspirin Other (See Comments)    Reaction:  Stomach bleeding    Fish Allergy    Naproxen Other (See Comments)    Reaction:  Stomach bleeding    Nsaids     GI BLEED   Shellfish Allergy Diarrhea, Nausea And Vomiting, Swelling and Rash    Medications Prior to Admission  Medication Sig Dispense Refill Last Dose   ondansetron (ZOFRAN ODT) 4 MG disintegrating tablet Take 1 tablet (4 mg total) by mouth every 8 (eight) hours as needed for nausea or vomiting. 4 tablet 0 Past Month   Prenatal Vit-Fe Fumarate-FA (PRENATAL VITAMIN PO) Take by mouth.   09/18/2021   acetaminophen (TYLENOL) 325 MG tablet Take 2 tablets (650 mg total) by mouth every 4 (four) hours as needed (for pain scale < 4). 100 tablet 0    famotidine (PEPCID) 20 MG tablet  Take 1 tablet (20 mg total) by mouth 2 (two) times daily. 30 tablet 4    FLUoxetine (PROZAC) 20 MG capsule Take 1 capsule (20 mg total) by mouth daily. 30 capsule 3     Review of Systems  Constitutional: Negative.   HENT: Negative.    Eyes: Negative.   Respiratory: Negative.    Cardiovascular: Negative.   Gastrointestinal: Negative.   Endocrine: Negative.   Genitourinary:  Positive for pelvic pain (occ BH ctxs that radiate to lower back) and vaginal discharge (leaking fluid all day).  Musculoskeletal:  Positive for back pain (makes it hard to sleep).  Skin: Negative.   Allergic/Immunologic: Negative.   Neurological: Negative.   Hematological: Negative.   Psychiatric/Behavioral: Negative.     Physical Exam   Blood pressure 113/64, pulse 79, temperature 97.9 F (36.6 C), temperature source Oral, resp. rate 18, height 5' 5"  (1.651 m), weight 122.8 kg, last menstrual period 02/04/2021, SpO2 98 %.  Physical Exam Vitals and nursing note reviewed. Exam conducted with a chaperone present.  Constitutional:      Appearance: Normal appearance.  She is obese.  Cardiovascular:     Rate and Rhythm: Normal rate.  Pulmonary:     Effort: Pulmonary effort is normal.  Abdominal:     Palpations: Abdomen is soft.  Genitourinary:    General: Normal vulva.     Comments: Pelvic exam: External genitalia normal, SE: vaginal walls pink and well rugated, cervix is smooth, pink, no lesions, small amt of clear, mucoid vaginal d/c -- WP, GC/CT done, cervix visually closed, Uterus is non-tender, S>D, no CMT or friability, no adnexal tenderness.  Skin:    General: Skin is warm and dry.  Neurological:     Mental Status: She is alert and oriented to person, place, and time.  Psychiatric:        Mood and Affect: Mood normal.        Behavior: Behavior normal.        Thought Content: Thought content normal.        Judgment: Judgment normal.    REACTIVE NST - FHR(A): 140 bpm / moderate variability / accels present / variable decels present / REACTIVE NST - FHR(B): 135 bpm / moderate variability / accels present / variable decels present / TOCO: regular every 3-4 mins  MAU Course  Procedures  MDM CCUA Amnisure Wet Prep Flexeril 10 mg po -- improved pain "I feel so much better"  Results for orders placed or performed during the hospital encounter of 09/18/21 (from the past 24 hour(s))  Urinalysis, Routine w reflex microscopic     Status: Abnormal   Collection Time: 09/18/21  9:43 PM  Result Value Ref Range   Color, Urine YELLOW YELLOW   APPearance HAZY (A) CLEAR   Specific Gravity, Urine 1.013 1.005 - 1.030   pH 5.0 5.0 - 8.0   Glucose, UA NEGATIVE NEGATIVE mg/dL   Hgb urine dipstick NEGATIVE NEGATIVE   Bilirubin Urine NEGATIVE NEGATIVE   Ketones, ur 5 (A) NEGATIVE mg/dL   Protein, ur NEGATIVE NEGATIVE mg/dL   Nitrite NEGATIVE NEGATIVE   Leukocytes,Ua SMALL (A) NEGATIVE   RBC / HPF 0-5 0 - 5 RBC/hpf   WBC, UA 11-20 0 - 5 WBC/hpf   Bacteria, UA MANY (A) NONE SEEN   Squamous Epithelial / LPF 0-5 0 - 5  Amnisure rupture of membrane  (rom)not at [facility]     Status: None   Collection Time: 09/18/21 11:00 PM  Result Value Ref  Range   Amnisure ROM NEGATIVE   Wet prep, genital     Status: Abnormal   Collection Time: 09/18/21 11:00 PM  Result Value Ref Range   Yeast Wet Prep HPF POC NONE SEEN NONE SEEN   Trich, Wet Prep NONE SEEN NONE SEEN   Clue Cells Wet Prep HPF POC NONE SEEN NONE SEEN   WBC, Wet Prep HPF POC >=10 (A) <10   Sperm NONE SEEN     Assessment and Plan  No leakage of amniotic fluid into vagina  - Reassurance given that water is not broken. Only normal vaginal d/c seen on exam  Back pain affecting pregnancy in third trimester - Rx Flexeril 10 mg BID prn back pain - Information provided on back pain in pregnancy   [redacted] weeks gestation of pregnancy   - Discharge patient - Keep scheduled appt with [facility] on 09/22/2021 - Patient verbalized an understanding of the plan of care and agrees.   Laury Deep, CNM 09/18/2021, 10:37 PM

## 2021-09-18 NOTE — MAU Note (Signed)
.  Kathleen Hamilton is a 26 y.o. at 13w2dhere in MAU reporting: increased watery clear discharge all throughout the day, noticing her underwear keeps staying wet even with a liner on. Pt reports BMontine Circleevery hour that is radiating to her back and increased pressure. Pt denies DFM, VB, PIH s/s, and complications in the pregnancy.  Onset of complaint: today Pain score: 6/10 Vitals:   09/18/21 2153  BP: 127/74  Pulse: 80  Resp: 18  Temp: 97.9 F (36.6 C)  SpO2: 98%     FHT:145/120 Lab orders placed from triage:  ua

## 2021-09-19 NOTE — MAU Provider Note (Incomplete)
History     CSN: [id number]  Arrival date and time: 09/18/21 2133   Event Date/Time   First Provider Initiated Contact with Patient 09/18/21 2237      Chief Complaint  Patient presents with  . Contractions  . Rupture of Membranes  . Vaginal Discharge   Kathleen Hamilton is a 26 y.o. year old G65P1011 female at 42w2dweeks gestation who presents to MAU reporting increased watery vaginal discharge throughout the day. She reports her underwear stayed wet even with a pantiliner. She also reports BH ctxs every hour radiating in the lower back and increased pressure; rated 6/10. She denies VB. Her pregnancy is complicated with Di-Di twins. [facility]; next appt is 09/22/2021. The FOB is present and contributing to the history taking.    OB History     Gravida  3   Para  1   Term  1   Preterm      AB  1   Living  1      SAB  1   IAB      Ectopic      Multiple  0   Live Births  1           Past Medical History:  Diagnosis Date  . Anxiety   . Asthma   . Dysmenorrhea   . Endometriosis   . Heart murmur   . Pregnancy induced hypertension   . Ulcerative colitis ([facility]     Past Surgical History:  Procedure Laterality Date  . BIOPSY BOWEL    . COLON BIOPSY     hx of colon problems.  ? mega colon, awaiting bx results  . NO PAST SURGERIES      Family History  Problem Relation Age of Onset  . Healthy Mother   . Healthy Father   . GI problems Brother   . Diabetes Maternal Grandmother   . Hypertension Maternal Grandmother   . Heart disease Maternal Grandmother   . Breast cancer Maternal Grandmother        unsure age of onset  . Diabetes Paternal Grandmother   . Heart disease Paternal Grandmother   . Hypertension Paternal Grandmother   . Diabetes Paternal Grandfather   . Heart disease Paternal Grandfather   . Hypertension Paternal Grandfather     Social History   Tobacco Use  . Smoking status: Never  . Smokeless tobacco: Never   Vaping Use  . Vaping Use: Never used  Substance Use Topics  . Alcohol use: No  . Drug use: No    Allergies:  Allergies  Allergen Reactions  . Aspirin Other (See Comments)    Reaction:  Stomach bleeding   . Fish Allergy   . Naproxen Other (See Comments)    Reaction:  Stomach bleeding   . Nsaids     GI BLEED  . Shellfish Allergy Diarrhea, Nausea And Vomiting, Swelling and Rash    Medications Prior to Admission  Medication Sig Dispense Refill Last Dose  . ondansetron (ZOFRAN ODT) 4 MG disintegrating tablet Take 1 tablet (4 mg total) by mouth every 8 (eight) hours as needed for nausea or vomiting. 4 tablet 0 Past Month  . Prenatal Vit-Fe Fumarate-FA (PRENATAL VITAMIN PO) Take by mouth.   09/18/2021  . acetaminophen (TYLENOL) 325 MG tablet Take 2 tablets (650 mg total) by mouth every 4 (four) hours as needed (for pain scale < 4). 100 tablet 0   . famotidine (PEPCID) 20 MG tablet  Take 1 tablet (20 mg total) by mouth 2 (two) times daily. 30 tablet 4   . FLUoxetine (PROZAC) 20 MG capsule Take 1 capsule (20 mg total) by mouth daily. 30 capsule 3     Review of Systems  Constitutional: Negative.   HENT: Negative.    Eyes: Negative.   Respiratory: Negative.    Cardiovascular: Negative.   Gastrointestinal: Negative.   Endocrine: Negative.   Genitourinary:  Positive for pelvic pain (occ BH ctxs that radiate to lower back) and vaginal discharge (leaking fluid all day).  Musculoskeletal:  Positive for back pain (makes it hard to sleep).  Skin: Negative.   Allergic/Immunologic: Negative.   Neurological: Negative.   Hematological: Negative.   Psychiatric/Behavioral: Negative.     Physical Exam   Blood pressure 113/64, pulse 79, temperature 97.9 F (36.6 C), temperature source Oral, resp. rate 18, height 5' 5"  (1.651 m), weight 122.8 kg, last menstrual period 02/04/2021, SpO2 98 %.  Physical Exam Vitals and nursing note reviewed. Exam conducted with a chaperone present.   Constitutional:      Appearance: Normal appearance. She is obese.  Cardiovascular:     Rate and Rhythm: Normal rate.  Pulmonary:     Effort: Pulmonary effort is normal.  Abdominal:     Palpations: Abdomen is soft.  Genitourinary:    General: Normal vulva.     Comments: Pelvic exam: External genitalia normal, SE: vaginal walls pink and well rugated, cervix is smooth, pink, no lesions, small amt of clear, mucoid vaginal d/c -- WP, GC/CT done, cervix visually closed, Uterus is non-tender, S>D, no CMT or friability, no adnexal tenderness.  Skin:    General: Skin is warm and dry.  Neurological:     Mental Status: She is alert and oriented to person, place, and time.  Psychiatric:        Mood and Affect: Mood normal.        Behavior: Behavior normal.        Thought Content: Thought content normal.        Judgment: Judgment normal.    REACTIVE NST - FHR(A): 140 bpm / moderate variability / accels present / variable decels present / REACTIVE NST - FHR(B): 135 bpm / moderate variability / accels present / variable decels present / TOCO: regular every 3-4 mins  MAU Course  Procedures  MDM CCUA Amnisure Wet Prep Flexeril 10 mg po -- improved pain "I feel so much better"  Results for orders placed or performed during the hospital encounter of 09/18/21 (from the past 24 hour(s))  Urinalysis, Routine w reflex microscopic     Status: Abnormal   Collection Time: 09/18/21  9:43 PM  Result Value Ref Range   Color, Urine YELLOW YELLOW   APPearance HAZY (A) CLEAR   Specific Gravity, Urine 1.013 1.005 - 1.030   pH 5.0 5.0 - 8.0   Glucose, UA NEGATIVE NEGATIVE mg/dL   Hgb urine dipstick NEGATIVE NEGATIVE   Bilirubin Urine NEGATIVE NEGATIVE   Ketones, ur 5 (A) NEGATIVE mg/dL   Protein, ur NEGATIVE NEGATIVE mg/dL   Nitrite NEGATIVE NEGATIVE   Leukocytes,Ua SMALL (A) NEGATIVE   RBC / HPF 0-5 0 - 5 RBC/hpf   WBC, UA 11-20 0 - 5 WBC/hpf   Bacteria, UA MANY (A) NONE SEEN   Squamous Epithelial  / LPF 0-5 0 - 5  Amnisure rupture of membrane (rom)not at [facility]     Status: None   Collection Time: 09/18/21 11:00 PM  Result Value Ref  Range   Amnisure ROM NEGATIVE   Wet prep, genital     Status: Abnormal   Collection Time: 09/18/21 11:00 PM  Result Value Ref Range   Yeast Wet Prep HPF POC NONE SEEN NONE SEEN   Trich, Wet Prep NONE SEEN NONE SEEN   Clue Cells Wet Prep HPF POC NONE SEEN NONE SEEN   WBC, Wet Prep HPF POC >=10 (A) <10   Sperm NONE SEEN     Assessment and Plan  No leakage of amniotic fluid into vagina  - Reassurance given that water is not broken. Only normal vaginal d/c seen on exam  Back pain affecting pregnancy in third trimester - Rx Flexeril 10 mg BID prn back pain  [redacted] weeks gestation of pregnancy   - Discharge patient  Laury Deep, Mallory Shirk 09/18/2021, 10:37 PM

## 2021-09-22 ENCOUNTER — Encounter: Payer: Self-pay | Admitting: Obstetrics & Gynecology

## 2021-09-22 ENCOUNTER — Ambulatory Visit (INDEPENDENT_AMBULATORY_CARE_PROVIDER_SITE_OTHER): Payer: Medicaid Other | Admitting: Obstetrics & Gynecology

## 2021-09-22 VITALS — BP 133/84 | HR 72 | Wt 267.6 lb

## 2021-09-22 DIAGNOSIS — Z3A32 32 weeks gestation of pregnancy: Secondary | ICD-10-CM

## 2021-09-22 DIAGNOSIS — O30042 Twin pregnancy, dichorionic/diamniotic, second trimester: Secondary | ICD-10-CM

## 2021-09-22 DIAGNOSIS — O0993 Supervision of high risk pregnancy, unspecified, third trimester: Secondary | ICD-10-CM

## 2021-09-22 LAB — POCT URINALYSIS DIPSTICK OB
Blood, UA: NEGATIVE
Glucose, UA: NEGATIVE
Ketones, UA: NEGATIVE
Nitrite, UA: NEGATIVE
POC,PROTEIN,UA: NEGATIVE

## 2021-09-22 NOTE — Progress Notes (Signed)
[facility] PREGNANCY VISIT Patient name: Kathleen Hamilton MRN [id number]  Date of birth: 05/11/1995 Chief Complaint:   High Risk Gestation (Went to [facility]; was having contractions; contractions are not as frequent now)  History of Present Illness:   Kathleen Hamilton is a 26 y.o. G34P1011 female at 5w6dwith an Estimated Date of Delivery: 11/11/21 being seen today for ongoing management of a high-risk pregnancy complicated by: DC/DA twins -seen in MAU due to preterm contractions, no cervical dilation -pain has improved notes irregular BH    Contractions: Irregular. Vag. Bleeding: None.  Movement: Present. denies leaking of fluid.      05/06/2021   10:58 AM 07/22/2020   10:41 AM  Depression screen PHQ 2/9  Decreased Interest 1 0  Down, Depressed, Hopeless 0 0  PHQ - 2 Score 1 0  Altered sleeping 1   Tired, decreased energy 1   Change in appetite 0   Feeling bad or failure about yourself  0   Trouble concentrating 0   Moving slowly or fidgety/restless 0   Suicidal thoughts 0   PHQ-9 Score 3      Current Outpatient Medications  Medication Instructions   acetaminophen (TYLENOL) 650 mg, Oral, Every 4 hours PRN   cyclobenzaprine (FLEXERIL) 10 mg, Oral, 2 times daily PRN   famotidine (PEPCID) 20 mg, Oral, 2 times daily   FLUoxetine (PROZAC) 20 mg, Oral, Daily   ondansetron (ZOFRAN ODT) 4 mg, Oral, Every 8 hours PRN   Prenatal Vit-Fe Fumarate-FA (PRENATAL VITAMIN PO) Oral     Review of Systems:   Pertinent items are noted in HPI Denies abnormal vaginal discharge w/ itching/odor/irritation, headaches, visual changes, shortness of breath, chest pain, abdominal pain, severe nausea/vomiting, or problems with urination or bowel movements unless otherwise stated above. Pertinent History Reviewed:  Reviewed past medical,surgical, social, obstetrical and family history.  Reviewed problem list, medications and allergies. Physical Assessment:   Vitals:   09/22/21 1440  BP: 133/84  Pulse: 72   Weight: 267 lb 9.6 oz (121.4 kg)  Body mass index is 44.53 kg/m.           Physical Examination:   General appearance: alert, well appearing, and in no distress  Mental status: normal mood, behavior, speech, dress, motor activity, and thought processes  Skin: warm & dry   Extremities: Edema: None    Cardiovascular: normal heart rate noted  Respiratory: normal respiratory effort, no distress  Abdomen: gravid, soft, non-tender  Pelvic: Cervical exam deferred         Fetal Status: Fetal Heart Rate (bpm): 140/145   Movement: Present    Fetal Surveillance Testing today: doppler   Chaperone: N/A    Results for orders placed or performed in visit on 09/22/21 (from the past 24 hour(s))  POC Urinalysis Dipstick OB   Collection Time: 09/22/21  2:37 PM  Result Value Ref Range   Color, UA     Clarity, UA     Glucose, UA Negative Negative   Bilirubin, UA     Ketones, UA neg    Spec Grav, UA     Blood, UA neg    pH, UA     POC,PROTEIN,UA Negative Negative, Trace, Small (1+), Moderate (2+), Large (3+), 4+   Urobilinogen, UA     Nitrite, UA neg    Leukocytes, UA Trace (A) Negative   Appearance     Odor       Assessment & Plan:  High-risk pregnancy: G3P1011 at 322w6dith an Estimated  Date of Delivery: 11/11/21   1) DC/DA twins -growth scheduled -start BPP at 36wks -[]  C-section due to fetal presentation @ 38wks  Meds: No orders of the defined types were placed in this encounter.   Labs/procedures today: none  Treatment Plan:  as outlined above  Reviewed: Preterm labor symptoms and general obstetric precautions including but not limited to vaginal bleeding, contractions, leaking of fluid and fetal movement were reviewed in detail with the patient.  All questions were answered. Pt has home bp cuff. Check bp weekly, let us know if >140/90.   Follow-up: Return in about 2 weeks (around 10/06/2021) for [place] and [place] as scheduled 8/25, then BPP weekly (starting 8/28).   Future  Appointments  Date Time Provider [place]  10/08/2021  9:15 AM [facility] - [facility] [place] [facility] None  10/08/2021 10:50 AM Janyth Pupa, DO [facility]    Orders Placed This Encounter  Procedures   POC Urinalysis Dipstick OB    Janyth Pupa, DO Attending [place], [facility] for [employer], [place] [facility]

## 2021-09-24 ENCOUNTER — Encounter: Payer: Self-pay | Admitting: Obstetrics & Gynecology

## 2021-09-27 ENCOUNTER — Other Ambulatory Visit: Payer: Self-pay | Admitting: Advanced Practice Midwife

## 2021-09-27 MED ORDER — ONDANSETRON 4 MG PO TBDP: 4.0000 mg | ORAL_TABLET | Freq: Three times a day (TID) | ORAL | 1 refills | Status: DC | PRN

## 2021-10-06 ENCOUNTER — Other Ambulatory Visit: Payer: Self-pay | Admitting: Obstetrics & Gynecology

## 2021-10-06 DIAGNOSIS — O30043 Twin pregnancy, dichorionic/diamniotic, third trimester: Secondary | ICD-10-CM

## 2021-10-08 ENCOUNTER — Encounter: Payer: Self-pay | Admitting: Obstetrics & Gynecology

## 2021-10-08 ENCOUNTER — Ambulatory Visit (INDEPENDENT_AMBULATORY_CARE_PROVIDER_SITE_OTHER): Payer: Medicaid Other | Admitting: Obstetrics & Gynecology

## 2021-10-08 ENCOUNTER — Ambulatory Visit (INDEPENDENT_AMBULATORY_CARE_PROVIDER_SITE_OTHER): Payer: Medicaid Other

## 2021-10-08 ENCOUNTER — Other Ambulatory Visit (HOSPITAL_COMMUNITY)
Admission: RE | Admit: 2021-10-08 | Discharge: 2021-10-08 | Disposition: A | Discharge status: Home / Self Care | Payer: Medicaid Other | Source: Ambulatory Visit | Attending: Obstetrics & Gynecology | Admitting: Obstetrics & Gynecology

## 2021-10-08 VITALS — BP 118/82 | HR 82 | Wt 269.6 lb

## 2021-10-08 DIAGNOSIS — O0992 Supervision of high risk pregnancy, unspecified, second trimester: Secondary | ICD-10-CM

## 2021-10-08 DIAGNOSIS — O30043 Twin pregnancy, dichorionic/diamniotic, third trimester: Secondary | ICD-10-CM

## 2021-10-08 DIAGNOSIS — O30042 Twin pregnancy, dichorionic/diamniotic, second trimester: Secondary | ICD-10-CM

## 2021-10-08 DIAGNOSIS — Z3A35 35 weeks gestation of pregnancy: Secondary | ICD-10-CM | POA: Insufficient documentation

## 2021-10-08 DIAGNOSIS — O0993 Supervision of high risk pregnancy, unspecified, third trimester: Secondary | ICD-10-CM

## 2021-10-08 DIAGNOSIS — Z8759 Personal history of other complications of pregnancy, childbirth and the puerperium: Secondary | ICD-10-CM

## 2021-10-08 NOTE — Addendum Note (Signed)
Addended by: Octaviano Glow on: 10/08/2021 12:18 PM   Modules accepted: Orders

## 2021-10-08 NOTE — Progress Notes (Signed)
HIGH-RISK PREGNANCY VISIT Patient name: Kathleen Hamilton MRN [id number]  Date of birth: August 04, 1995 Chief Complaint:   Routine Prenatal Visit  History of Present Illness:   Kathleen Hamilton is a 26 y.o. G1P1011 female at 35w1dwith an Estimated Date of Delivery: 11/11/21 being seen today for ongoing management of a high-risk pregnancy complicated by:  -DC/DA twins- Twin A presentation.    Today she reports  Braxton-Hicks contractions- sometimes fairly strong, but not regular .   Contractions: Irregular. Vag. Bleeding: None.  Movement: Present. denies leaking of fluid.      05/06/2021   10:58 AM 07/22/2020   10:41 AM  Depression screen PHQ 2/9  Decreased Interest 1 0  Down, Depressed, Hopeless 0 0  PHQ - 2 Score 1 0  Altered sleeping 1   Tired, decreased energy 1   Change in appetite 0   Feeling bad or failure about yourself  0   Trouble concentrating 0   Moving slowly or fidgety/restless 0   Suicidal thoughts 0   PHQ-9 Score 3      Current Outpatient Medications  Medication Instructions   acetaminophen (TYLENOL) 650 mg, Oral, Every 4 hours PRN   cyclobenzaprine (FLEXERIL) 10 mg, Oral, 2 times daily PRN   famotidine (PEPCID) 20 mg, Oral, 2 times daily   FLUoxetine (PROZAC) 20 mg, Oral, Daily   ondansetron (ZOFRAN ODT) 4 mg, Oral, Every 8 hours PRN   Prenatal Vit-Fe Fumarate-FA (PRENATAL VITAMIN PO) Oral     Review of Systems:   Pertinent items are noted in HPI Denies abnormal vaginal discharge w/ itching/odor/irritation, headaches, visual changes, shortness of breath, chest pain, abdominal pain, severe nausea/vomiting, or problems with urination or bowel movements unless otherwise stated above. Pertinent History Reviewed:  Reviewed past medical,surgical, social, obstetrical and family history.  Reviewed problem list, medications and allergies. Physical Assessment:   Vitals:   10/08/21 1045  BP: 118/82  Pulse: 82  Weight: 269 lb 9.6 oz (122.3 kg)  Body mass index is  44.86 kg/m.           Physical Examination:   General appearance: alert, well appearing, and in no distress  Mental status: normal mood, behavior, speech, dress, motor activity, and thought processes  Skin: warm & dry   Extremities: Edema: None    Cardiovascular: normal heart rate noted  Respiratory: normal respiratory effort, no distress  Abdomen: gravid, soft, non-tender  Pelvic: Cervical exam deferred  Dilation: Closed Effacement (%): Thick Station: -3  Fetal Status:     Movement: Present    Fetal Surveillance Testing today: 35+1 wks, BABY A:female,complete breech right,posterior fundal placenta gr 2,SVP of fluid 5.8 cm,FHR 121 bpm,EFW 2289 g 16.7% BABY B: female,cephalic left,posterior placenta gr 2,SVP of fluid 6.1 mm,FHR 144 bpm,EFW 2243 g 13.6%,discordance 2%   Chaperone:  pt declined     No results found for this or any previous visit (from the past 24 hour(s)).   Assessment & Plan:  High-risk pregnancy: G3P1011 at 353w1dith an Estimated Date of Delivery: 11/11/21   1) DC/DA twins- with breech Twin A -antepartum testing to start next week -growth appropriate as above -C-section @ 38wk- referral created for 9/14  2) anxiety- on prozac daily  Meds: No orders of the defined types were placed in this encounter.   Labs/procedures today: Growth scan  Treatment Plan:  as outlined above  Reviewed: Preterm labor symptoms and general obstetric precautions including but not limited to vaginal bleeding, contractions, leaking of fluid and fetal movement  were reviewed in detail with the patient.  All questions were answered. Pt has home bp cuff. Check bp weekly, let us know if >140/90.   Follow-up: Return in about 1 week (around 10/15/2021) for HROB visit and weekly BPPs (already scheduled).   Future Appointments  Date Time Provider [place]  10/13/2021  9:30 AM [facility] NURSE [facility]  10/13/2021  9:50 AM Janyth Pupa, DO [facility]  10/21/2021  3:10 PM  [facility] NURSE [facility]  10/25/2021  2:15 PM [facility] - [facility] [place] [facility] None  10/25/2021  3:50 PM Janyth Pupa, DO [facility]  10/28/2021  9:30 AM [facility] NURSE [facility]  11/01/2021 10:00 AM [facility] - [facility] [place] [facility] None  11/01/2021 11:30 AM Roma Schanz, CNM [facility]  11/04/2021  9:30 AM [facility] NURSE [facility]  11/08/2021 10:00 AM [facility] [place] [facility] None  11/08/2021 11:50 AM Florian Buff, MD [facility]  11/11/2021  9:30 AM [facility] NURSE [facility]    Janyth Pupa, DO Attending [place], [place] for [place], [place]

## 2021-10-08 NOTE — Progress Notes (Signed)
[place] DC/DA TWINS 35+1 wks, BABY A:female,complete breech right,posterior fundal placenta gr 2,SVP of fluid 5.8 cm,FHR 121 bpm,EFW 2289 g 16.7% BABY B: female,cephalic left,posterior placenta gr 2,SVP of fluid 6.1 mm,FHR 144 bpm,EFW 2243 g 13.6%,discordance 2%

## 2021-10-09 ENCOUNTER — Inpatient Hospital Stay (HOSPITAL_COMMUNITY)
Admission: AD | Admit: 2021-10-09 | Discharge: 2021-10-09 | Disposition: A | Discharge status: Home / Self Care | Payer: Medicaid Other | Attending: Obstetrics & Gynecology | Admitting: Obstetrics & Gynecology

## 2021-10-09 ENCOUNTER — Encounter (HOSPITAL_COMMUNITY): Payer: Self-pay | Admitting: Obstetrics & Gynecology

## 2021-10-09 DIAGNOSIS — O30043 Twin pregnancy, dichorionic/diamniotic, third trimester: Secondary | ICD-10-CM | POA: Diagnosis not present

## 2021-10-09 DIAGNOSIS — O212 Late vomiting of pregnancy: Secondary | ICD-10-CM | POA: Insufficient documentation

## 2021-10-09 DIAGNOSIS — Z3A35 35 weeks gestation of pregnancy: Secondary | ICD-10-CM

## 2021-10-09 DIAGNOSIS — O479 False labor, unspecified: Secondary | ICD-10-CM

## 2021-10-09 DIAGNOSIS — O4703 False labor before 37 completed weeks of gestation, third trimester: Secondary | ICD-10-CM | POA: Insufficient documentation

## 2021-10-09 DIAGNOSIS — O219 Vomiting of pregnancy, unspecified: Secondary | ICD-10-CM

## 2021-10-09 DIAGNOSIS — O368130 Decreased fetal movements, third trimester, not applicable or unspecified: Secondary | ICD-10-CM | POA: Diagnosis present

## 2021-10-09 LAB — URINALYSIS, ROUTINE W REFLEX MICROSCOPIC
Bilirubin Urine: NEGATIVE
Glucose, UA: NEGATIVE mg/dL
Hgb urine dipstick: NEGATIVE
Ketones, ur: NEGATIVE mg/dL
Nitrite: NEGATIVE
Protein, ur: NEGATIVE mg/dL
Specific Gravity, Urine: 1.012 (ref 1.005–1.030)
pH: 5 (ref 5.0–8.0)

## 2021-10-09 MED ORDER — ONDANSETRON 8 MG PO TBDP: 4.0000 mg | ORAL_TABLET | Freq: Three times a day (TID) | ORAL | 0 refills | Status: DC | PRN

## 2021-10-09 MED ORDER — ONDANSETRON 4 MG PO TBDP
8.0000 mg | ORAL_TABLET | Freq: Once | ORAL | Status: AC
Start: 2021-10-09 — End: 2021-10-09
  Administered 2021-10-09: 8 mg via ORAL
  Filled 2021-10-09: qty 2

## 2021-10-09 MED ORDER — BUTORPHANOL TARTRATE 2 MG/ML IJ SOLN
1.0000 mg | Freq: Once | INTRAMUSCULAR | Status: AC
  Administered 2021-10-09: 1 mg via INTRAMUSCULAR
  Filled 2021-10-09: qty 1

## 2021-10-09 NOTE — MAU Note (Addendum)
...  Kathleen Hamilton is a 26 y.o. at 17w2dhere in MAU reporting: Intermittent upper abdominal pain for the past two hours. She reports she believes they are contractions as she gets a 1-2 minute break in between the pain she is feeling. She reports the pain feels like tightness. Denies VB or LOF. DFM. Has not felt any movement since 0500.   DC/DA twins. Hx GHTN.  Was evaluated in office yesterday. SVE Closed, Thick, -3. Baby A breech. Baby B cephalic. C/S scheduled for 38 weeks.  Onset of complaint: This morning around 0600. Pain score: 9/10 upper abdomen   Lab orders placed from triage:  UA

## 2021-10-09 NOTE — MAU Provider Note (Signed)
History     CSN: [id number]  Arrival date and time: 10/09/21 2330   Event Date/Time   First Provider Initiated Contact with Patient 10/09/21 [phone]      Chief Complaint  Patient presents with   Abdominal Pain   Contractions   Decreased Fetal Movement   HPI  Ms.Kathleen Hamilton is a 26 y.o. female G60P1011 @ 28w2dhere in MAU with complaints of contractions. Di/Di twins. Reports contraction pain that comes every 1-2 minutes. The pain feels like cramping and tightness. She has no bleeding or LOF. Reports decreased fetal movement.   OB History     Gravida  3   Para  1   Term  1   Preterm      AB  1   Living  1      SAB  1   IAB      Ectopic      Multiple  0   Live Births  1           Past Medical History:  Diagnosis Date   Anxiety    Asthma    Dysmenorrhea    Endometriosis    Heart murmur    Pregnancy induced hypertension    Ulcerative colitis ([facility]     Past Surgical History:  Procedure Laterality Date   BIOPSY BOWEL     COLON BIOPSY     hx of colon problems.  ? mega colon, awaiting bx results   NO PAST SURGERIES      Family History  Problem Relation Age of Onset   Healthy Mother    Healthy Father    GI problems Brother    Diabetes Maternal Grandmother    Hypertension Maternal Grandmother    Heart disease Maternal Grandmother    Breast cancer Maternal Grandmother        unsure age of onset   Diabetes Paternal Grandmother    Heart disease Paternal Grandmother    Hypertension Paternal Grandmother    Diabetes Paternal Grandfather    Heart disease Paternal Grandfather    Hypertension Paternal Grandfather     Social History   Tobacco Use   Smoking status: Never   Smokeless tobacco: Never  Vaping Use   Vaping Use: Never used  Substance Use Topics   Alcohol use: No   Drug use: No    Allergies:  Allergies  Allergen Reactions   Aspirin Other (See Comments)    Reaction:  Stomach bleeding    Fish Allergy    Naproxen Other (See  Comments)    Reaction:  Stomach bleeding    Nsaids     GI BLEED   Shellfish Allergy Diarrhea, Nausea And Vomiting, Swelling and Rash    Medications Prior to Admission  Medication Sig Dispense Refill Last Dose   acetaminophen (TYLENOL) 325 MG tablet Take 2 tablets (650 mg total) by mouth every 4 (four) hours as needed (for pain scale < 4). 100 tablet 0    cyclobenzaprine (FLEXERIL) 10 MG tablet Take 1 tablet (10 mg total) by mouth 2 (two) times daily as needed for muscle spasms. (Patient not taking: Reported on 10/08/2021) 60 tablet 0    famotidine (PEPCID) 20 MG tablet Take 1 tablet (20 mg total) by mouth 2 (two) times daily. 30 tablet 4    FLUoxetine (PROZAC) 20 MG capsule Take 1 capsule (20 mg total) by mouth daily. 30 capsule 3    ondansetron (ZOFRAN ODT) 4 MG disintegrating tablet Take 1 tablet (4 mg total)  by mouth every 8 (eight) hours as needed for nausea or vomiting. 4 tablet 1    Prenatal Vit-Fe Fumarate-FA (PRENATAL VITAMIN PO) Take by mouth.      Results for orders placed or performed during the hospital encounter of 10/09/21 (from the past 48 hour(s))  Urinalysis, Routine w reflex microscopic Urine, Clean Catch     Status: Abnormal   Collection Time: 10/09/21  8:20 AM  Result Value Ref Range   Color, Urine YELLOW YELLOW   APPearance HAZY (A) CLEAR   Specific Gravity, Urine 1.012 1.005 - 1.030   pH 5.0 5.0 - 8.0   Glucose, UA NEGATIVE NEGATIVE mg/dL   Hgb urine dipstick NEGATIVE NEGATIVE   Bilirubin Urine NEGATIVE NEGATIVE   Ketones, ur NEGATIVE NEGATIVE mg/dL   Protein, ur NEGATIVE NEGATIVE mg/dL   Nitrite NEGATIVE NEGATIVE   Leukocytes,Ua MODERATE (A) NEGATIVE   RBC / HPF 0-5 0 - 5 RBC/hpf   WBC, UA 6-10 0 - 5 WBC/hpf   Bacteria, UA MANY (A) NONE SEEN   Squamous Epithelial / LPF 0-5 0 - 5   Mucus PRESENT    Hyaline Casts, UA PRESENT     Comment: Performed at [facility], [address] [place], [place] [address]    Review of Systems  Gastrointestinal:   Positive for abdominal pain.  Genitourinary:  Negative for vaginal bleeding and vaginal discharge.   Physical Exam   Blood pressure 125/76, pulse 97, temperature (!) 97.4 F (36.3 C), temperature source Oral, resp. rate 20, height 5' 5"  (1.651 m), weight 121.6 kg, last menstrual period 02/04/2021, SpO2 96 %.  Physical Exam Constitutional:      General: She is not in acute distress.    Appearance: She is well-developed. She is not ill-appearing, toxic-appearing or diaphoretic.  Eyes:     Pupils: Pupils are equal, round, and reactive to light.  Genitourinary:    Comments: Dilation: Fingertip Effacement (%): Thick Exam by:: Noni Saupe, NP  Skin:    General: Skin is warm.  Neurological:     Mental Status: She is alert and oriented to person, place, and time.  Psychiatric:        Mood and Affect: Mood normal.    Fetal Tracing Fetus A Baseline: 130 bpm Variability: Moderate Accelerations: 15x15 Decelerations: None Toco: frequent irritability.   Fetal Tracing Fetus B Baseline:  135/140 bpm Variability: Moderate  Accelerations: 15x15 Decelerations: None   MAU Course  Procedures  MDM  Cervix remains the same as it was yesterday.  Patient requesting pain medication IM Stadol given Zofran 8 mg PO- Patient rates pain 2/10, feels well enough to go home.  Urine culture pending.   Assessment and Plan   A  1. Braxton Hicks contractions   2. Dichorionic diamniotic twin pregnancy in third trimester   3. Nausea and vomiting during pregnancy   4. [redacted] weeks gestation of pregnancy      P:  Dc home Return to MAU if symptoms worsen Increase oral fluid intake Rest  [facility] [place], Artist Pais, NP 10/09/2021 4:35 PM

## 2021-10-10 LAB — CULTURE, OB URINE

## 2021-10-11 LAB — CERVICOVAGINAL ANCILLARY ONLY
Chlamydia: NEGATIVE
Comment: NEGATIVE
Comment: NORMAL
Neisseria Gonorrhea: NEGATIVE

## 2021-10-12 ENCOUNTER — Encounter: Payer: Self-pay | Admitting: Obstetrics & Gynecology

## 2021-10-12 LAB — CULTURE, BETA STREP (GROUP B ONLY): Strep Gp B Culture: NEGATIVE

## 2021-10-13 ENCOUNTER — Other Ambulatory Visit: Payer: Medicaid Other

## 2021-10-13 ENCOUNTER — Encounter: Payer: Self-pay | Admitting: Obstetrics & Gynecology

## 2021-10-13 ENCOUNTER — Ambulatory Visit (INDEPENDENT_AMBULATORY_CARE_PROVIDER_SITE_OTHER): Payer: Medicaid Other | Admitting: Obstetrics & Gynecology

## 2021-10-13 VITALS — BP 125/91 | HR 87 | Wt 270.4 lb

## 2021-10-13 DIAGNOSIS — L299 Pruritus, unspecified: Secondary | ICD-10-CM | POA: Diagnosis not present

## 2021-10-13 DIAGNOSIS — O99713 Diseases of the skin and subcutaneous tissue complicating pregnancy, third trimester: Secondary | ICD-10-CM | POA: Diagnosis not present

## 2021-10-13 DIAGNOSIS — O30043 Twin pregnancy, dichorionic/diamniotic, third trimester: Secondary | ICD-10-CM

## 2021-10-13 DIAGNOSIS — O0993 Supervision of high risk pregnancy, unspecified, third trimester: Secondary | ICD-10-CM

## 2021-10-13 NOTE — Progress Notes (Signed)
HIGH-RISK PREGNANCY VISIT Patient name: Kathleen Hamilton MRN [id number]  Date of birth: 02-23-1995 Chief Complaint:   Routine Prenatal Visit and High Risk Gestation (NST)  History of Present Illness:   Kathleen Hamilton is a 26 y.o. G92P1011 female at 66w6dwith an Estimated Date of Delivery: 11/11/21 being seen today for ongoing management of a high-risk pregnancy complicated by:  -DC/DA twins  Today she reports  itching of her palms/soles- much worse at night. Still having contractions, seen in [facility]U this past weekend.  Contractions: Irregular. Denies vaginal bleeding .  Movement: Present. denies leaking of fluid.      05/06/2021   10:58 AM 07/22/2020   10:41 AM  Depression screen PHQ 2/9  Decreased Interest 1 0  Down, Depressed, Hopeless 0 0  PHQ - 2 Score 1 0  Altered sleeping 1   Tired, decreased energy 1   Change in appetite 0   Feeling bad or failure about yourself  0   Trouble concentrating 0   Moving slowly or fidgety/restless 0   Suicidal thoughts 0   PHQ-9 Score 3      Current Outpatient Medications  Medication Instructions   acetaminophen (TYLENOL) 650 mg, Oral, Every 4 hours PRN   famotidine (PEPCID) 20 mg, Oral, 2 times daily   FLUoxetine (PROZAC) 20 mg, Oral, Daily   ondansetron (ZOFRAN-ODT) 4 mg, Oral, Every 8 hours PRN   Prenatal Vit-Fe Fumarate-FA (PRENATAL VITAMIN PO) Oral     Review of Systems:   Pertinent items are noted in HPI Denies abnormal vaginal discharge w/ itching/odor/irritation, headaches, visual changes, shortness of breath, chest pain, abdominal pain, severe nausea/vomiting, or problems with urination or bowel movements unless otherwise stated above. Pertinent History Reviewed:  Reviewed past medical,surgical, social, obstetrical and family history.  Reviewed problem list, medications and allergies. Physical Assessment:   Vitals:   10/13/21 0943 10/13/21 1018  BP: (!) 131/93 (!) 125/91  Pulse: 76 87  Weight: 270 lb 6.4 oz (122.7 kg)    Body mass index is 45 kg/m.           Physical Examination:   General appearance: alert, well appearing, and in no distress  Mental status: normal mood, behavior, speech, dress, motor activity, and thought processes  Skin: warm & dry   Extremities: Edema: Trace    Cardiovascular: normal heart rate noted  Respiratory: normal respiratory effort, no distress  Abdomen: gravid, soft, non-tender  Pelvic: Cervical exam performed  Dilation: Closed Effacement (%): Thick Station: -3  Fetal Status:     Movement: Present    Fetal Surveillance Testing today: NST  NST being performed due to DC/DA Twin  Fetal Monitoring:  Twin A Baseline: 140 bpm, Variability: moderate, Accelerations: present, The accelerations are >15 bpm and more than 2 in 20 minutes, and Decelerations: Absent    Twin B: 145bpm, moderate variability, +accels, no decels   Final diagnosis:  Reactive NST x 2  On toco: +ctx q 4-59m    Chaperone:  pt declined     No results found for this or any previous visit (from the past 24 hour(s)).   Assessment & Plan:  High-risk pregnancy: G3P1011 at 3533w6dth an Estimated Date of Delivery: 11/11/21   1) DC/DA twins -continue with weekly antepartum testing -currently scheduled for pLTCS @ 38wks  2) Pruritis- concern for intrahepatic cholestasis of pregnancy -lab work to be done today -discussed with patient early delivery if indicated  3) Preterm contractions -no cervical change noted -reviewed precautions  Meds:  No orders of the defined types were placed in this encounter.   Labs/procedures today: bile acids, LFTs today  Treatment Plan:  as outlined above  Reviewed: Preterm labor symptoms and general obstetric precautions including but not limited to vaginal bleeding, contractions, leaking of fluid and fetal movement were reviewed in detail with the patient.  All questions were answered. Pt has home bp cuff. Check bp weekly, let us know if >140/90.   Follow-up:  Return for as scheduled.   Future Appointments  Date Time Provider [place]  10/21/2021  3:10 PM [facility] NURSE [facility]  10/25/2021  2:15 PM [place] - FTOBGYN [place] [facility] None  10/25/2021  3:50 PM Janyth Pupa, DO [facility]  10/28/2021  9:30 AM [facility] NURSE [facility]  11/01/2021 10:00 AM [facility] - [facility]YN [place] [facility] None  11/01/2021 11:30 AM Roma Schanz, CNM [facility]  11/04/2021  9:30 AM [facility] NURSE [facility]  11/08/2021 10:00 AM [facility] - [facility]TO[facility]YN [place] [facility] None  11/08/2021 11:50 AM Florian Buff, MD [facility]  11/11/2021  9:30 AM [facility] NURSE [facility]    Orders Placed This Encounter  Procedures   CBC   Bile acids, total   Comprehensive metabolic panel    Janyth Pupa, DO Attending [place], [place] for [employer], [place]

## 2021-10-14 LAB — CBC
Hematocrit: 38.5 % (ref 34.0–46.6)
Hemoglobin: 12.6 g/dL (ref 11.1–15.9)
MCH: 30.7 pg (ref 26.6–33.0)
MCHC: 32.7 g/dL (ref 31.5–35.7)
MCV: 94 fL (ref 79–97)
Platelets: 278 10*3/uL (ref 150–450)
RBC: 4.1 x10E6/uL (ref 3.77–5.28)
RDW: 14.4 % (ref 11.7–15.4)
WBC: 10.8 10*3/uL (ref 3.4–10.8)

## 2021-10-14 LAB — COMPREHENSIVE METABOLIC PANEL
ALT: 10 IU/L (ref 0–32)
AST: 17 IU/L (ref 0–40)
Albumin/Globulin Ratio: 1.1 — ABNORMAL LOW (ref 1.2–2.2)
Albumin: 3.4 g/dL — ABNORMAL LOW (ref 4.0–5.0)
Alkaline Phosphatase: 145 IU/L — ABNORMAL HIGH (ref 44–121)
BUN/Creatinine Ratio: 11 (ref 9–23)
BUN: 7 mg/dL (ref 6–20)
Bilirubin Total: 0.5 mg/dL (ref 0.0–1.2)
CO2: 16 mmol/L — ABNORMAL LOW (ref 20–29)
Calcium: 9.1 mg/dL (ref 8.7–10.2)
Chloride: 109 mmol/L — ABNORMAL HIGH (ref 96–106)
Creatinine, Ser: 0.64 mg/dL (ref 0.57–1.00)
Globulin, Total: 3.2 g/dL (ref 1.5–4.5)
Glucose: 79 mg/dL (ref 70–99)
Potassium: 4.2 mmol/L (ref 3.5–5.2)
Sodium: 140 mmol/L (ref 134–144)
Total Protein: 6.6 g/dL (ref 6.0–8.5)
eGFR: 126 mL/min/{1.73_m2} (ref >59)

## 2021-10-14 LAB — BILE ACIDS, TOTAL: Bile Acids Total: 5.3 umol/L (ref 0.0–10.0)

## 2021-10-14 NOTE — Patient Instructions (Signed)
Kathleen Hamilton  10/14/2021   Your procedure is scheduled on:  10/28/2021  Arrive at 42 at Entrance C on [address] at [facility]  and [employer]. You are invited to use the FREE valet parking or use the Visitor's parking deck.  Pick up the phone at the desk and dial [phone].  Call this number if you have problems the morning of surgery: [phone]  Remember:   Do not eat food:(After Midnight) Desps de medianoche.  Do not drink clear liquids: (After Midnight) Desps de medianoche.  Take these medicines the morning of surgery with A SIP OF WATER:  Take prozac as prescribed   Do not wear jewelry, make-up or nail polish.  Do not wear lotions, powders, or perfumes. Do not wear deodorant.  Do not shave 48 hours prior to surgery.  Do not bring valuables to the hospital.  [facility] is not   responsible for any belongings or valuables brought to the hospital.  Contacts, dentures or bridgework may not be worn into surgery.  Leave suitcase in the car. After surgery it may be brought to your room.  For patients admitted to the hospital, checkout time is 11:00 AM the day of              discharge.      Please read over the following fact sheets that you were given:     Preparing for Surgery

## 2021-10-15 ENCOUNTER — Encounter (HOSPITAL_COMMUNITY): Payer: Self-pay

## 2021-10-21 ENCOUNTER — Ambulatory Visit (INDEPENDENT_AMBULATORY_CARE_PROVIDER_SITE_OTHER): Payer: Medicaid Other | Admitting: *Deleted

## 2021-10-21 DIAGNOSIS — O0993 Supervision of high risk pregnancy, unspecified, third trimester: Secondary | ICD-10-CM

## 2021-10-21 DIAGNOSIS — Z3A37 37 weeks gestation of pregnancy: Secondary | ICD-10-CM

## 2021-10-21 DIAGNOSIS — O30043 Twin pregnancy, dichorionic/diamniotic, third trimester: Secondary | ICD-10-CM | POA: Diagnosis not present

## 2021-10-21 NOTE — Progress Notes (Signed)
   NURSE VISIT- NST  SUBJECTIVE:  Kathleen Hamilton is a 26 y.o. G60P1011 female at [redacted]w[redacted]d here for a NST for pregnancy complicated by Multiple gestation.  She reports active fetal movement, contractions: occasional, vaginal bleeding: none, membranes: intact.   OBJECTIVE:  BP 135/88   Pulse 75   Wt 273 lb (123.8 kg)   LMP 02/04/2021   BMI 45.43 kg/m   Appears well, no apparent distress  No results found for this or any previous visit (from the past 24 hour(s)).  NST: FHR A baseline 130 bpm, Variability: moderate, Accelerations:present, Decelerations:  Absent= Cat 1/reactive  FHR B baseline 135, variability: moderate, Accelerations:present, decelerations:absent=Cat1/reactive Toco: every 2 minutes, patient only feeling a few   ASSESSMENT: G3P1011 at 332w0dith Multiple gestation NST reactive  PLAN: EFM strip reviewed by Dr. OzNelda Marseille Recommendations: keep next appointment as scheduled    LaAlice Rieger9/08/2021 3:07 PM

## 2021-10-22 ENCOUNTER — Other Ambulatory Visit: Payer: Self-pay | Admitting: Obstetrics & Gynecology

## 2021-10-22 DIAGNOSIS — O30043 Twin pregnancy, dichorionic/diamniotic, third trimester: Secondary | ICD-10-CM

## 2021-10-25 ENCOUNTER — Ambulatory Visit (INDEPENDENT_AMBULATORY_CARE_PROVIDER_SITE_OTHER): Payer: Medicaid Other

## 2021-10-25 ENCOUNTER — Ambulatory Visit (INDEPENDENT_AMBULATORY_CARE_PROVIDER_SITE_OTHER): Payer: Medicaid Other | Admitting: Obstetrics & Gynecology

## 2021-10-25 VITALS — BP 133/85 | HR 67 | Wt 276.0 lb

## 2021-10-25 DIAGNOSIS — O30043 Twin pregnancy, dichorionic/diamniotic, third trimester: Secondary | ICD-10-CM

## 2021-10-25 DIAGNOSIS — O0993 Supervision of high risk pregnancy, unspecified, third trimester: Secondary | ICD-10-CM | POA: Diagnosis not present

## 2021-10-25 DIAGNOSIS — Z8759 Personal history of other complications of pregnancy, childbirth and the puerperium: Secondary | ICD-10-CM

## 2021-10-25 DIAGNOSIS — Z3A37 37 weeks gestation of pregnancy: Secondary | ICD-10-CM

## 2021-10-25 DIAGNOSIS — O368132 Decreased fetal movements, third trimester, fetus 2: Secondary | ICD-10-CM

## 2021-10-25 NOTE — Progress Notes (Signed)
HIGH-RISK PREGNANCY VISIT Patient name: Kathleen Hamilton MRN [id number]  Date of birth: August 29, 1995 Chief Complaint:   Routine Prenatal Visit  History of Present Illness:   Kathleen Hamilton is a 26 y.o. G19P1011 female at 53w4dwith an Estimated Date of Delivery: 11/11/21 being seen today for ongoing management of a high-risk pregnancy complicated by: -DC/DA twins.    Today she reports  pelvic pressure, irregular contractions and ready to be delivered .   Of note, reports decreased movement of Twin B- tried eating/drinking and moving him and he will be slow to respond.  Contractions: Irregular. Vag. Bleeding: None.  Movement: Present. denies leaking of fluid.      05/06/2021   10:58 AM 07/22/2020   10:41 AM  Depression screen PHQ 2/9  Decreased Interest 1 0  Down, Depressed, Hopeless 0 0  PHQ - 2 Score 1 0  Altered sleeping 1   Tired, decreased energy 1   Change in appetite 0   Feeling bad or failure about yourself  0   Trouble concentrating 0   Moving slowly or fidgety/restless 0   Suicidal thoughts 0   PHQ-9 Score 3      Current Outpatient Medications  Medication Instructions   acetaminophen (TYLENOL) 500-1,000 mg, Oral, Every 6 hours PRN   calcium carbonate (TUMS - DOSED IN MG ELEMENTAL CALCIUM) 500 MG chewable tablet 1-2 tablets, Oral, 3 times daily PRN   famotidine (PEPCID) 20 mg, Oral, 2 times daily   FLUoxetine (PROZAC) 20 mg, Oral, Daily   ondansetron (ZOFRAN-ODT) 4 mg, Oral, Every 8 hours PRN   Prenatal Vit-Fe Fumarate-FA (PRENATAL VITAMIN PO) 1 tablet, Oral, Every evening     Review of Systems:   Pertinent items are noted in HPI Denies abnormal vaginal discharge w/ itching/odor/irritation, headaches, visual changes, shortness of breath, chest pain, abdominal pain, severe nausea/vomiting, or problems with urination or bowel movements unless otherwise stated above. Pertinent History Reviewed:  Reviewed past medical,surgical, social, obstetrical and family history.   Reviewed problem list, medications and allergies. Physical Assessment:  There were no vitals filed for this visit.There is no height or weight on file to calculate BMI.           Physical Examination:   General appearance: alert, well appearing, and in no distress  Mental status: normal mood, behavior, speech, dress, motor activity, and thought processes  Skin: warm & dry   Extremities: Edema: None    Cardiovascular: normal heart rate noted  Respiratory: normal respiratory effort, no distress  Abdomen: gravid, soft, non-tender  Pelvic: Cervical exam deferred         Fetal Status:     Movement: Present    Fetal Surveillance Testing today:  37+4 wks DC/DA TWINS,posterior fundal placenta gr 3 (limited view) BABY A: female,complete breech right,BPP 8/8,FHR 154 bpm,SVP of fluid 6 cm BABY B: female,cephalic left,BPP 85/4,OEV126 bpm,SVP of fluid 4.4 cm  Chaperone: N/A    NST being performed due to decreased fetal movement   Fetal Monitoring:  TWIN A: Baseline: 135 bpm, Variability: moderate, Accelerations: present, The accelerations are >15 bpm and more than 2 in 20 minutes, and Decelerations: Absent     Final diagnosis: Reactive NST  TWIN B: Baseline: 130 bpm, Variability: moderate, Accelerations: present, The accelerations are >15 bpm and more than 2 in 20 minutes, and Decelerations: Absent     Final diagnosis: Reactive NST  Assessment & Plan:  High-risk pregnancy: G3P1011 at 351w4dith an Estimated Date of Delivery: 11/11/21  1) DC/DA twins -BPP 8/8 for both twins -due to decreased movement- NST also completed- reactive x 2 -scheduled for C-section due to breech presentation of Twin A -questions/concerns were addressed  Meds: No orders of the defined types were placed in this encounter.   Labs/procedures today: BPP/NST  Treatment Plan:  as outlined above  Reviewed: Term labor symptoms and general obstetric precautions including but not limited to vaginal bleeding,  contractions, leaking of fluid and fetal movement were reviewed in detail with the patient.  All questions were answered. Pt has home bp cuff. Check bp weekly, let us know if >140/90.   Follow-up: Return for  1wk postop check from 9/14 then 5-6wk PPV.   Future Appointments  Date Time Provider [place]  10/25/2021  3:50 PM Janyth Pupa, DO [facility]  10/26/2021 10:00 AM [facility] PAT 1 [facility]-INDC None    No orders of the defined types were placed in this encounter.   Janyth Pupa, DO Attending [place], [facility], [place] for [employer], [place]

## 2021-10-25 NOTE — Progress Notes (Signed)
[place] 37+4 wks DC/DA TWINS,posterior fundal placenta gr 3 (limited view) BABY A: female,complete breech right,BPP 8/8,FHR 154 bpm,SVP of fluid 6 cm BABY B: female,cephalic left,BPP 9/7,NPY 126 bpm,SVP of fluid 4.4 cm

## 2021-10-26 ENCOUNTER — Encounter (HOSPITAL_COMMUNITY)
Admission: RE | Admit: 2021-10-26 | Discharge: 2021-10-26 | Disposition: A | Discharge status: Home / Self Care | Payer: Medicaid Other | Source: Ambulatory Visit | Attending: Obstetrics & Gynecology | Admitting: Obstetrics & Gynecology

## 2021-10-26 DIAGNOSIS — Z3A38 38 weeks gestation of pregnancy: Secondary | ICD-10-CM | POA: Diagnosis not present

## 2021-10-26 DIAGNOSIS — O0993 Supervision of high risk pregnancy, unspecified, third trimester: Secondary | ICD-10-CM | POA: Insufficient documentation

## 2021-10-26 DIAGNOSIS — Z01812 Encounter for preprocedural laboratory examination: Secondary | ICD-10-CM | POA: Insufficient documentation

## 2021-10-26 LAB — CBC WITH DIFFERENTIAL/PLATELET
Abs Immature Granulocytes: 0.03 10*3/uL (ref 0.00–0.07)
Basophils Absolute: 0.1 10*3/uL (ref 0.0–0.1)
Basophils Relative: 1 %
Eosinophils Absolute: 0.2 10*3/uL (ref 0.0–0.5)
Eosinophils Relative: 2 %
HCT: 33.3 % — ABNORMAL LOW (ref 36.0–46.0)
Hemoglobin: 11.6 g/dL — ABNORMAL LOW (ref 12.0–15.0)
Immature Granulocytes: 0 %
Lymphocytes Relative: 30 %
Lymphs Abs: 2.7 10*3/uL (ref 0.7–4.0)
MCH: 31.9 pg (ref 26.0–34.0)
MCHC: 34.8 g/dL (ref 30.0–36.0)
MCV: 91.5 fL (ref 80.0–100.0)
Monocytes Absolute: 0.5 10*3/uL (ref 0.1–1.0)
Monocytes Relative: 6 %
Neutro Abs: 5.5 10*3/uL (ref 1.7–7.7)
Neutrophils Relative %: 61 %
Platelets: 233 10*3/uL (ref 150–400)
RBC: 3.64 MIL/uL — ABNORMAL LOW (ref 3.87–5.11)
RDW: 15.1 % (ref 11.5–15.5)
WBC: 9 10*3/uL (ref 4.0–10.5)
nRBC: 0 % (ref 0.0–0.2)

## 2021-10-26 LAB — TYPE AND SCREEN
ABO/RH(D): A POS
Antibody Screen: NEGATIVE

## 2021-10-26 LAB — RPR: RPR Ser Ql: NONREACTIVE

## 2021-10-28 ENCOUNTER — Other Ambulatory Visit: Payer: Medicaid Other

## 2021-10-28 ENCOUNTER — Encounter (HOSPITAL_COMMUNITY): Payer: Self-pay | Admitting: Obstetrics and Gynecology

## 2021-10-28 ENCOUNTER — Encounter (HOSPITAL_COMMUNITY)
Admission: RE | Disposition: A | Discharge status: Home / Self Care | Payer: Self-pay | Source: Home / Self Care | Attending: Obstetrics and Gynecology

## 2021-10-28 ENCOUNTER — Inpatient Hospital Stay (HOSPITAL_COMMUNITY): Payer: Medicaid Other | Admitting: Anesthesiology

## 2021-10-28 ENCOUNTER — Other Ambulatory Visit: Payer: Self-pay

## 2021-10-28 ENCOUNTER — Inpatient Hospital Stay (HOSPITAL_COMMUNITY)
Admission: RE | Admit: 2021-10-28 | Discharge: 2021-10-30 | DRG: 787 | Disposition: A | Discharge status: Home / Self Care | Payer: Medicaid Other | Attending: Obstetrics and Gynecology | Admitting: Obstetrics and Gynecology

## 2021-10-28 DIAGNOSIS — O365930 Maternal care for other known or suspected poor fetal growth, third trimester, not applicable or unspecified: Secondary | ICD-10-CM | POA: Diagnosis present

## 2021-10-28 DIAGNOSIS — O30043 Twin pregnancy, dichorionic/diamniotic, third trimester: Secondary | ICD-10-CM | POA: Diagnosis present

## 2021-10-28 DIAGNOSIS — O30009 Twin pregnancy, unspecified number of placenta and unspecified number of amniotic sacs, unspecified trimester: Secondary | ICD-10-CM | POA: Diagnosis not present

## 2021-10-28 DIAGNOSIS — O329XX1 Maternal care for malpresentation of fetus, unspecified, fetus 1: Secondary | ICD-10-CM

## 2021-10-28 DIAGNOSIS — O321XX1 Maternal care for breech presentation, fetus 1: Principal | ICD-10-CM | POA: Diagnosis present

## 2021-10-28 DIAGNOSIS — Z3A38 38 weeks gestation of pregnancy: Secondary | ICD-10-CM

## 2021-10-28 DIAGNOSIS — O9902 Anemia complicating childbirth: Secondary | ICD-10-CM | POA: Diagnosis present

## 2021-10-28 DIAGNOSIS — O358XX1 Maternal care for other (suspected) fetal abnormality and damage, fetus 1: Secondary | ICD-10-CM | POA: Diagnosis present

## 2021-10-28 DIAGNOSIS — O0993 Supervision of high risk pregnancy, unspecified, third trimester: Principal | ICD-10-CM

## 2021-10-28 DIAGNOSIS — O329XX0 Maternal care for malpresentation of fetus, unspecified, not applicable or unspecified: Secondary | ICD-10-CM | POA: Diagnosis present

## 2021-10-28 DIAGNOSIS — D573 Sickle-cell trait: Secondary | ICD-10-CM | POA: Diagnosis present

## 2021-10-28 DIAGNOSIS — Z98891 History of uterine scar from previous surgery: Secondary | ICD-10-CM

## 2021-10-28 SURGERY — Surgical Case
Anesthesia: Spinal

## 2021-10-28 MED ORDER — FENTANYL CITRATE (PF) 100 MCG/2ML IJ SOLN
INTRAMUSCULAR | Status: AC
  Filled 2021-10-28: qty 2

## 2021-10-28 MED ORDER — FENTANYL CITRATE (PF) 100 MCG/2ML IJ SOLN
50.0000 ug | INTRAMUSCULAR | Status: DC | PRN
  Administered 2021-10-28: 100 ug via INTRAVENOUS

## 2021-10-28 MED ORDER — SODIUM CHLORIDE 0.9 % IR SOLN
Status: DC | PRN
  Administered 2021-10-28: 1

## 2021-10-28 MED ORDER — OXYTOCIN-SODIUM CHLORIDE 30-0.9 UT/500ML-% IV SOLN
INTRAVENOUS | Status: DC | PRN
  Administered 2021-10-28: 450 mL via INTRAVENOUS

## 2021-10-28 MED ORDER — MENTHOL 3 MG MT LOZG: 1.0000 | LOZENGE | OROMUCOSAL | Status: DC | PRN

## 2021-10-28 MED ORDER — DIPHENHYDRAMINE HCL 25 MG PO CAPS: 25.0000 mg | ORAL_CAPSULE | Freq: Four times a day (QID) | ORAL | Status: DC | PRN

## 2021-10-28 MED ORDER — GABAPENTIN 100 MG PO CAPS
200.0000 mg | ORAL_CAPSULE | Freq: Every day | ORAL | Status: DC
  Administered 2021-10-28 – 2021-10-29 (×2): 200 mg via ORAL
  Filled 2021-10-28 (×2): qty 2

## 2021-10-28 MED ORDER — OXYTOCIN-SODIUM CHLORIDE 30-0.9 UT/500ML-% IV SOLN
INTRAVENOUS | Status: AC
  Filled 2021-10-28: qty 500

## 2021-10-28 MED ORDER — COCONUT OIL OIL
1.0000 | TOPICAL_OIL | Status: DC | PRN
  Administered 2021-10-30 (×2): 1 via TOPICAL

## 2021-10-28 MED ORDER — HYDROMORPHONE HCL 1 MG/ML IJ SOLN: 0.2000 mg | INTRAMUSCULAR | Status: DC | PRN

## 2021-10-28 MED ORDER — MEPERIDINE HCL 25 MG/ML IJ SOLN: 6.2500 mg | INTRAMUSCULAR | Status: DC | PRN

## 2021-10-28 MED ORDER — OXYCODONE HCL 5 MG PO TABS
5.0000 mg | ORAL_TABLET | Freq: Four times a day (QID) | ORAL | Status: DC
  Administered 2021-10-28 – 2021-10-29 (×3): 5 mg via ORAL
  Filled 2021-10-28 (×3): qty 1

## 2021-10-28 MED ORDER — ACETAMINOPHEN 10 MG/ML IV SOLN
INTRAVENOUS | Status: AC
  Filled 2021-10-28: qty 100

## 2021-10-28 MED ORDER — FENTANYL CITRATE (PF) 100 MCG/2ML IJ SOLN
INTRAMUSCULAR | Status: DC | PRN
  Administered 2021-10-28: 15 ug via INTRATHECAL
  Administered 2021-10-28: 35 ug via INTRATHECAL

## 2021-10-28 MED ORDER — MORPHINE SULFATE (PF) 0.5 MG/ML IJ SOLN
INTRAMUSCULAR | Status: DC | PRN
  Administered 2021-10-28: 150 ug via INTRATHECAL

## 2021-10-28 MED ORDER — ONDANSETRON HCL 4 MG/2ML IJ SOLN: 4.0000 mg | Freq: Three times a day (TID) | INTRAMUSCULAR | Status: DC | PRN

## 2021-10-28 MED ORDER — ENOXAPARIN SODIUM 60 MG/0.6ML IJ SOSY
60.0000 mg | PREFILLED_SYRINGE | INTRAMUSCULAR | Status: DC
  Administered 2021-10-29 – 2021-10-30 (×2): 60 mg via SUBCUTANEOUS
  Filled 2021-10-28 (×2): qty 0.6

## 2021-10-28 MED ORDER — SCOPOLAMINE 1 MG/3DAYS TD PT72
1.0000 | MEDICATED_PATCH | TRANSDERMAL | Status: DC
  Administered 2021-10-28: 1.5 mg via TRANSDERMAL

## 2021-10-28 MED ORDER — SCOPOLAMINE 1 MG/3DAYS TD PT72: 1.0000 | MEDICATED_PATCH | Freq: Once | TRANSDERMAL | Status: DC

## 2021-10-28 MED ORDER — NALOXONE HCL 0.4 MG/ML IJ SOLN: 0.4000 mg | INTRAMUSCULAR | Status: DC | PRN

## 2021-10-28 MED ORDER — BUPIVACAINE IN DEXTROSE 0.75-8.25 % IT SOLN
INTRATHECAL | Status: DC | PRN
  Administered 2021-10-28: 1.6 mL via INTRATHECAL

## 2021-10-28 MED ORDER — PHENYLEPHRINE HCL-NACL 20-0.9 MG/250ML-% IV SOLN
INTRAVENOUS | Status: DC | PRN
  Administered 2021-10-28: 60 ug/min via INTRAVENOUS

## 2021-10-28 MED ORDER — MORPHINE SULFATE (PF) 0.5 MG/ML IJ SOLN
INTRAMUSCULAR | Status: AC
  Filled 2021-10-28: qty 10

## 2021-10-28 MED ORDER — METOCLOPRAMIDE HCL 5 MG/ML IJ SOLN
INTRAMUSCULAR | Status: DC | PRN
  Administered 2021-10-28: 10 mg via INTRAVENOUS

## 2021-10-28 MED ORDER — ACETAMINOPHEN 10 MG/ML IV SOLN
1000.0000 mg | Freq: Once | INTRAVENOUS | Status: DC | PRN
  Administered 2021-10-28: 1000 mg via INTRAVENOUS

## 2021-10-28 MED ORDER — ONDANSETRON HCL 4 MG/2ML IJ SOLN
INTRAMUSCULAR | Status: AC
  Filled 2021-10-28: qty 2

## 2021-10-28 MED ORDER — MEDROXYPROGESTERONE ACETATE 150 MG/ML IM SUSP: 150.0000 mg | INTRAMUSCULAR | Status: DC | PRN

## 2021-10-28 MED ORDER — SOD CITRATE-CITRIC ACID 500-334 MG/5ML PO SOLN
30.0000 mL | ORAL | Status: AC
  Administered 2021-10-28: 30 mL via ORAL

## 2021-10-28 MED ORDER — POVIDONE-IODINE 10 % EX SWAB
2.0000 | Freq: Once | CUTANEOUS | Status: AC
  Administered 2021-10-28: 2 via TOPICAL

## 2021-10-28 MED ORDER — SODIUM CHLORIDE 0.9% FLUSH: 3.0000 mL | INTRAVENOUS | Status: DC | PRN

## 2021-10-28 MED ORDER — NALOXONE HCL 4 MG/10ML IJ SOLN: 1.0000 ug/kg/h | INTRAVENOUS | Status: DC | PRN

## 2021-10-28 MED ORDER — SCOPOLAMINE 1 MG/3DAYS TD PT72
MEDICATED_PATCH | TRANSDERMAL | Status: AC
  Filled 2021-10-28: qty 1

## 2021-10-28 MED ORDER — PHENYLEPHRINE 80 MCG/ML (10ML) SYRINGE FOR IV PUSH (FOR BLOOD PRESSURE SUPPORT)
PREFILLED_SYRINGE | INTRAVENOUS | Status: AC
  Filled 2021-10-28: qty 10

## 2021-10-28 MED ORDER — PHENYLEPHRINE HCL (PRESSORS) 10 MG/ML IV SOLN: INTRAVENOUS | Status: DC | PRN

## 2021-10-28 MED ORDER — WITCH HAZEL-GLYCERIN EX PADS: 1.0000 | MEDICATED_PAD | CUTANEOUS | Status: DC | PRN

## 2021-10-28 MED ORDER — PHENYLEPHRINE HCL (PRESSORS) 10 MG/ML IV SOLN
INTRAVENOUS | Status: DC | PRN
  Administered 2021-10-28: 160 ug via INTRAVENOUS
  Administered 2021-10-28 (×2): 80 ug via INTRAVENOUS
  Administered 2021-10-28: 160 ug via INTRAVENOUS
  Administered 2021-10-28: 80 ug via INTRAVENOUS

## 2021-10-28 MED ORDER — SIMETHICONE 80 MG PO CHEW: 80.0000 mg | CHEWABLE_TABLET | ORAL | Status: DC | PRN

## 2021-10-28 MED ORDER — CEFAZOLIN IN SODIUM CHLORIDE 3-0.9 GM/100ML-% IV SOLN
INTRAVENOUS | Status: AC
  Filled 2021-10-28: qty 100

## 2021-10-28 MED ORDER — SOD CITRATE-CITRIC ACID 500-334 MG/5ML PO SOLN
ORAL | Status: AC
  Filled 2021-10-28: qty 30

## 2021-10-28 MED ORDER — PRENATAL MULTIVITAMIN CH
1.0000 | ORAL_TABLET | Freq: Every day | ORAL | Status: DC
  Administered 2021-10-29 – 2021-10-30 (×2): 1 via ORAL
  Filled 2021-10-28 (×2): qty 1

## 2021-10-28 MED ORDER — DIPHENHYDRAMINE HCL 25 MG PO CAPS
25.0000 mg | ORAL_CAPSULE | ORAL | Status: DC | PRN
  Administered 2021-10-30: 25 mg via ORAL
  Filled 2021-10-28: qty 1

## 2021-10-28 MED ORDER — DIBUCAINE (PERIANAL) 1 % EX OINT: 1.0000 | TOPICAL_OINTMENT | CUTANEOUS | Status: DC | PRN

## 2021-10-28 MED ORDER — BUPIVACAINE HCL (PF) 0.5 % IJ SOLN
INTRAMUSCULAR | Status: AC
  Filled 2021-10-28: qty 30

## 2021-10-28 MED ORDER — METOCLOPRAMIDE HCL 5 MG/ML IJ SOLN
INTRAMUSCULAR | Status: AC
  Filled 2021-10-28: qty 2

## 2021-10-28 MED ORDER — TETANUS-DIPHTH-ACELL PERTUSSIS 5-2.5-18.5 LF-MCG/0.5 IM SUSY: 0.5000 mL | PREFILLED_SYRINGE | Freq: Once | INTRAMUSCULAR | Status: DC

## 2021-10-28 MED ORDER — DIPHENHYDRAMINE HCL 50 MG/ML IJ SOLN: 12.5000 mg | INTRAMUSCULAR | Status: DC | PRN

## 2021-10-28 MED ORDER — EPHEDRINE SULFATE-NACL 50-0.9 MG/10ML-% IV SOSY
PREFILLED_SYRINGE | INTRAVENOUS | Status: DC | PRN
  Administered 2021-10-28 (×2): 5 mg via INTRAVENOUS

## 2021-10-28 MED ORDER — PHENYLEPHRINE HCL-NACL 20-0.9 MG/250ML-% IV SOLN
INTRAVENOUS | Status: AC
  Filled 2021-10-28: qty 250

## 2021-10-28 MED ORDER — CEFAZOLIN IN SODIUM CHLORIDE 3-0.9 GM/100ML-% IV SOLN
3.0000 g | INTRAVENOUS | Status: AC
  Administered 2021-10-28: 3 g via INTRAVENOUS

## 2021-10-28 MED ORDER — FLUOXETINE HCL 20 MG PO CAPS
20.0000 mg | ORAL_CAPSULE | Freq: Every day | ORAL | Status: DC
  Administered 2021-10-29 – 2021-10-30 (×2): 20 mg via ORAL
  Filled 2021-10-28 (×2): qty 1

## 2021-10-28 MED ORDER — INFLUENZA VAC SPLIT QUAD 0.5 ML IM SUSY: 0.5000 mL | PREFILLED_SYRINGE | INTRAMUSCULAR | Status: DC

## 2021-10-28 MED ORDER — ACETAMINOPHEN 500 MG PO TABS
1000.0000 mg | ORAL_TABLET | Freq: Three times a day (TID) | ORAL | Status: DC
  Administered 2021-10-28 – 2021-10-29 (×2): 1000 mg via ORAL
  Filled 2021-10-28 (×3): qty 2

## 2021-10-28 MED ORDER — DEXAMETHASONE SODIUM PHOSPHATE 4 MG/ML IJ SOLN
INTRAMUSCULAR | Status: AC
  Filled 2021-10-28: qty 2

## 2021-10-28 MED ORDER — ONDANSETRON HCL 4 MG/2ML IJ SOLN
INTRAMUSCULAR | Status: DC | PRN
  Administered 2021-10-28: 4 mg via INTRAVENOUS

## 2021-10-28 MED ORDER — DEXAMETHASONE SODIUM PHOSPHATE 4 MG/ML IJ SOLN
INTRAMUSCULAR | Status: DC | PRN
  Administered 2021-10-28: 8 mg via INTRAVENOUS

## 2021-10-28 MED ORDER — SIMETHICONE 80 MG PO CHEW
80.0000 mg | CHEWABLE_TABLET | Freq: Three times a day (TID) | ORAL | Status: DC
  Administered 2021-10-29 – 2021-10-30 (×5): 80 mg via ORAL
  Filled 2021-10-28 (×5): qty 1

## 2021-10-28 MED ORDER — OXYTOCIN-SODIUM CHLORIDE 30-0.9 UT/500ML-% IV SOLN: 2.5000 [IU]/h | INTRAVENOUS | Status: AC

## 2021-10-28 MED ORDER — LACTATED RINGERS IV SOLN
INTRAVENOUS | Status: DC
Start: 2021-10-28 — End: 2021-10-28

## 2021-10-28 MED ORDER — SENNOSIDES-DOCUSATE SODIUM 8.6-50 MG PO TABS
2.0000 | ORAL_TABLET | Freq: Every day | ORAL | Status: DC
  Administered 2021-10-29 – 2021-10-30 (×2): 2 via ORAL
  Filled 2021-10-28 (×2): qty 2

## 2021-10-28 SURGICAL SUPPLY — 42 items
APL SKNCLS STERI-STRIP NONHPOA (GAUZE/BANDAGES/DRESSINGS) ×1
BENZOIN TINCTURE PRP APPL 2/3 (GAUZE/BANDAGES/DRESSINGS) IMPLANT
CHLORAPREP W/TINT 26ML (MISCELLANEOUS) ×4 IMPLANT
CLAMP CORD UMBIL (MISCELLANEOUS) ×2 IMPLANT
CLOTH BEACON ORANGE TIMEOUT ST (SAFETY) ×2 IMPLANT
DRESSING PREVENA PLUS CUSTOM (GAUZE/BANDAGES/DRESSINGS) IMPLANT
DRSG OPSITE POSTOP 4X10 (GAUZE/BANDAGES/DRESSINGS) ×2 IMPLANT
DRSG PREVENA PLUS CUSTOM (GAUZE/BANDAGES/DRESSINGS) ×1
ELECT REM PT RETURN 9FT ADLT (ELECTROSURGICAL) ×1
ELECTRODE REM PT RTRN 9FT ADLT (ELECTROSURGICAL) ×2 IMPLANT
EXTENDER TRAXI PANNICULUS (MISCELLANEOUS) IMPLANT
EXTRACTOR VACUUM BELL STYLE (SUCTIONS) IMPLANT
GAUZE SPONGE 4X4 12PLY STRL LF (GAUZE/BANDAGES/DRESSINGS) IMPLANT
GLOVE BIOGEL PI IND STRL 6.5 (GLOVE) ×4 IMPLANT
GLOVE ECLIPSE 6.5 STRL STRAW (GLOVE) ×4 IMPLANT
GOWN STRL REUS W/TWL LRG LVL3 (GOWN DISPOSABLE) ×6 IMPLANT
KIT ABG SYR 3ML LUER SLIP (SYRINGE) IMPLANT
MAT PREVALON FULL STRYKER (MISCELLANEOUS) IMPLANT
NDL HYPO 25X1 1.5 SAFETY (NEEDLE) IMPLANT
NEEDLE HYPO 22GX1.5 SAFETY (NEEDLE) ×2 IMPLANT
NEEDLE HYPO 25X1 1.5 SAFETY (NEEDLE) IMPLANT
NS IRRIG 1000ML POUR BTL (IV SOLUTION) ×2 IMPLANT
PACK C SECTION WH (CUSTOM PROCEDURE TRAY) ×2 IMPLANT
PAD ABD 7.5X8 STRL (GAUZE/BANDAGES/DRESSINGS) IMPLANT
PAD OB MATERNITY 4.3X12.25 (PERSONAL CARE ITEMS) ×2 IMPLANT
RETRACTOR TRAXI PANNICULUS (MISCELLANEOUS) IMPLANT
RETRACTOR WND ALEXIS 25 LRG (MISCELLANEOUS) IMPLANT
RTRCTR WOUND ALEXIS 25CM LRG (MISCELLANEOUS) ×1
STRIP CLOSURE SKIN 1/2X4 (GAUZE/BANDAGES/DRESSINGS) IMPLANT
SUT MON AB 4-0 PS1 27 (SUTURE) ×2 IMPLANT
SUT PDS AB 0 CTX 60 (SUTURE) IMPLANT
SUT PLAIN 2 0 (SUTURE) ×1
SUT PLAIN ABS 2-0 CT1 27XMFL (SUTURE) ×2 IMPLANT
SUT VIC AB 0 CT1 36 (SUTURE) ×4 IMPLANT
SUT VIC AB 0 CTX 36 (SUTURE) ×1
SUT VIC AB 0 CTX36XBRD ANBCTRL (SUTURE) ×2 IMPLANT
SYR CONTROL 10ML LL (SYRINGE) ×2 IMPLANT
TOWEL OR 17X24 6PK STRL BLUE (TOWEL DISPOSABLE) ×2 IMPLANT
TRAXI PANNICULUS EXTENDER (MISCELLANEOUS) ×1
TRAXI PANNICULUS RETRACTOR (MISCELLANEOUS) ×1
TRAY FOLEY W/BAG SLVR 14FR LF (SET/KITS/TRAYS/PACK) ×2 IMPLANT
WATER STERILE IRR 1000ML POUR (IV SOLUTION) ×2 IMPLANT

## 2021-10-28 NOTE — H&P (Signed)
Obstetric Preoperative History and Physical  Kathleen Hamilton is a 26 y.o. G3P1011 with IUP at 54w0dpresenting for scheduled cesarean section.  No acute concerns.   Prenatal Course Source of Care: [facility] with onset of care at 13 weeks Pregnancy complications or risks: Patient Active Problem List   Diagnosis Date Noted   Fetal malpresentation 10/28/2021   Anxiety 05/06/2021   Supervision of high-risk pregnancy 05/06/2021   History of gestational hypertension 05/06/2021   Dichorionic diamniotic twin gestation 05/06/2021   History of prior pregnancy with IUGR newborn 05/06/2021   Sickle cell trait ([place] 05/06/2021   VISUAL ACUITY, DECREASED 04/19/2010   OVERWEIGHT 04/15/2010   DYSMENORRHEA 04/15/2010   MENORRHAGIA 04/15/2010    She desires oral progesterone-only contraceptive for postpartum contraception.   Prenatal labs and studies: ABO, Rh: --/--/A POS (09/12 1034) Antibody: NEG (09/12 1034) Rubella: 4.27 (03/23 1213) RPR: NON REACTIVE (09/12 1020)  HBsAg: Negative (03/23 1213)  HIV: Non Reactive (07/07 0936)  GVVO:HYWVPXTG/- (08/25 1400) 3 hr Glucola  normal Genetic screening normal Anatomy [place]ormal  Prenatal Transfer Tool  Fetal Ultrasounds or other Referrals:  Referred to Materal Fetal Medicine  Maternal Substance Abuse:  No Significant Maternal Medications:  None Significant Maternal Lab Results: None  Past Medical History:  Diagnosis Date   Anxiety    Asthma    Dysmenorrhea    Endometriosis    Heart murmur    Pregnancy induced hypertension    Ulcerative colitis ([place]     Past Surgical History:  Procedure Laterality Date   BIOPSY BOWEL     COLON BIOPSY     hx of colon problems.  ? mega colon, awaiting bx results   NO PAST SURGERIES      OB History  Gravida Para Term Preterm AB Living  3 1 1   1 1   SAB IAB Ectopic Multiple Live Births  1     0 1    # Outcome Date GA Lbr Len/2nd Weight Sex Delivery Anes PTL Lv  3 Current           2 Term  07/11/18 322w6d3:55 / 00:32 2290 g F Vag-Vacuum EPI, Local N LIV     Complications: Small for gestational age  3 46AB             Social History   Socioeconomic History   Marital status: Significant Other    Spouse name: Not on file   Number of children: Not on file   Years of education: Not on file   Highest education level: Not on file  Occupational History   Not on file  Tobacco Use   Smoking status: Never   Smokeless tobacco: Never  Vaping Use   Vaping Use: Never used  Substance and Sexual Activity   Alcohol use: No   Drug use: No   Sexual activity: Not Currently    Birth control/protection: None  Other Topics Concern   Not on file  Social History Narrative   Not on file   Social Determinants of Health   Financial Resource Strain: Low Risk  (05/06/2021)   Overall Financial Resource Strain (CARDIA)    Difficulty of Paying Living Expenses: Not hard at all  Food Insecurity: No Food Insecurity (10/28/2021)   Hunger Vital Sign    Worried About Running Out of Food in the Last Year: Never true    Ran Out of Food in the Last Year: Never true  Transportation Needs: No Transportation Needs (10/28/2021)  PRAPARE - Hydrologist (Medical): No    Lack of Transportation (Non-Medical): No  Physical Activity: Insufficiently Active (05/06/2021)   Exercise Vital Sign    Days of Exercise per Week: 3 days    Minutes of Exercise per Session: 30 min  Stress: No Stress Concern Present (05/06/2021)   [place]    Feeling of Stress : Only a little  Social Connections: Moderately Isolated (05/06/2021)   Social Connection and Isolation Panel [NHANES]    Frequency of Communication with Friends and Family: More than three times a week    Frequency of Social Gatherings with Friends and Family: Twice a week    Attends Religious Services: Never    Marine scientist or Organizations: No    Attends  Music therapist: Never    Marital Status: Living with partner    Family History  Problem Relation Age of Onset   Healthy Mother    Healthy Father    GI problems Brother    Diabetes Maternal Grandmother    Hypertension Maternal Grandmother    Heart disease Maternal Grandmother    Breast cancer Maternal Grandmother        unsure age of onset   Diabetes Paternal Grandmother    Heart disease Paternal Grandmother    Hypertension Paternal Grandmother    Diabetes Paternal Grandfather    Heart disease Paternal Grandfather    Hypertension Paternal Grandfather     Medications Prior to Admission  Medication Sig Dispense Refill Last Dose   acetaminophen (TYLENOL) 500 MG tablet Take 500-1,000 mg by mouth every 6 (six) hours as needed (pain.).      calcium carbonate (TUMS - DOSED IN MG ELEMENTAL CALCIUM) 500 MG chewable tablet Chew 1-2 tablets by mouth 3 (three) times daily as needed for indigestion or heartburn.      famotidine (PEPCID) 20 MG tablet Take 1 tablet (20 mg total) by mouth 2 (two) times daily. (Patient taking differently: Take 20 mg by mouth 2 (two) times daily as needed for heartburn or indigestion.) 30 tablet 4    FLUoxetine (PROZAC) 20 MG capsule Take 1 capsule (20 mg total) by mouth daily. 30 capsule 3    ondansetron (ZOFRAN-ODT) 8 MG disintegrating tablet Take 0.5 tablets (4 mg total) by mouth every 8 (eight) hours as needed for nausea or vomiting. 20 tablet 0    Prenatal Vit-Fe Fumarate-FA (PRENATAL VITAMIN PO) Take 1 tablet by mouth every evening.       Allergies  Allergen Reactions   Aspirin Other (See Comments)     Stomach bleeding    Fish Allergy Diarrhea, Nausea And Vomiting, Swelling and Other (See Comments)    Stomach pain.   Naproxen Other (See Comments)    stomach bleeding    Nsaids Other (See Comments)    GI BLEED   Shellfish Allergy Diarrhea, Nausea And Vomiting, Swelling and Rash    Review of Systems: Negative except for what is mentioned  in HPI.  Physical Exam: BP 127/69   Pulse 80   Temp 97.8 F (36.6 C) (Oral)   Resp 20   Ht 5' 5"  (1.651 m)   Wt 125.5 kg   LMP 02/04/2021   SpO2 99%   BMI 46.03 kg/m  FHR by Doppler: 131 bpm, 124 bpm CONSTITUTIONAL: Well-developed, well-nourished female in no acute distress.  HENT:  Normocephalic, atraumatic, External right and left ear normal. Oropharynx is clear and  moist EYES: Conjunctivae and EOM are normal. Pupils are equal, round, and reactive to light. No scleral icterus.  NECK: Normal range of motion, supple, no masses SKIN: Skin is warm and dry. No rash noted. Not diaphoretic. No erythema. No pallor. [place]: Alert and oriented to person, place, and time. Normal reflexes, muscle tone coordination. No cranial nerve deficit noted. PSYCHIATRIC: Normal mood and affect. Normal behavior. Normal judgment and thought content. CARDIOVASCULAR: Normal heart rate noted, regular rhythm RESPIRATORY: Effort and breath sounds normal, no problems with respiration noted ABDOMEN: Soft, nontender, nondistended, gravid.  PELVIC: Deferred MUSCULOSKELETAL: Normal range of motion. No edema and no tenderness. 2+ distal pulses.   Pertinent Labs/Studies:   Results for orders placed or performed during the hospital encounter of 10/26/21 (from the past 72 hour(s))  RPR     Status: None   Collection Time: 10/26/21 10:20 AM  Result Value Ref Range   RPR Ser Ql NON REACTIVE NON REACTIVE    Comment: Performed at [facility], [address], [place], [place] [address]  CBC with Differential/Platelet     Status: Abnormal   Collection Time: 10/26/21 10:20 AM  Result Value Ref Range   WBC 9.0 4.0 - 10.5 K/uL   RBC 3.64 (L) 3.87 - 5.11 MIL/uL   Hemoglobin 11.6 (L) 12.0 - 15.0 g/dL   HCT 33.3 (L) 36.0 - 46.0 %   MCV 91.5 80.0 - 100.0 fL   MCH 31.9 26.0 - 34.0 pg   MCHC 34.8 30.0 - 36.0 g/dL   RDW 15.1 11.5 - 15.5 %   Platelets 233 150 - 400 K/uL   nRBC 0.0 0.0 - 0.2 %   Neutrophils  Relative % 61 %   Neutro Abs 5.5 1.7 - 7.7 K/uL   Lymphocytes Relative 30 %   Lymphs Abs 2.7 0.7 - 4.0 K/uL   Monocytes Relative 6 %   Monocytes Absolute 0.5 0.1 - 1.0 K/uL   Eosinophils Relative 2 %   Eosinophils Absolute 0.2 0.0 - 0.5 K/uL   Basophils Relative 1 %   Basophils Absolute 0.1 0.0 - 0.1 K/uL   Immature Granulocytes 0 %   Abs Immature Granulocytes 0.03 0.00 - 0.07 K/uL    Comment: Performed at [facility] [address], [place], [place] [address]  Type and screen [place]     Status: None   Collection Time: 10/26/21 10:34 AM  Result Value Ref Range   ABO/RH(D) A POS    Antibody Screen NEG    Sample Expiration      10/29/2021,2359 Performed at [facility], [place] [address], [place], [place] [address]     Assessment and Plan :Kathleen Hamilton is a 26 y.o. G3P1011 at 67w0dwith di/di twins being admitted for scheduled primary cesarean section for malpresentation of Twin A. She is contracting regularly as of this morning, no other acute issues.  The risks of cesarean section were discussed with the patient; including but not limited to: infection which may require antibiotics; bleeding which may require transfusion or re-operation; injury to bowel, bladder, ureters or other surrounding organs; injury to the fetus; need for additional procedures in the event of a life-threatening hemorrhage; placental abnormalities wth subsequent pregnancies,  risk of needing c-sections in future pregnancies, incisional problems, thromboembolic phenomenon and other postoperative/anesthesia complications. Answered all questions. The patient verbalized understanding of the plan, giving informed consent for the procedure. She is agreeable to blood transfusion in the event of emergency.  Patient has been NPO since 12 am, she will remain NPO for procedure Anesthesia and OR aware Preoperative prophylactic antibiotics and SCDs ordered on call to the OR  To OR when  ready    K. Arvilla Meres, M.D. Attending [place], [facility] for [employer], [place]

## 2021-10-28 NOTE — Anesthesia Preprocedure Evaluation (Signed)
Anesthesia Evaluation  Patient identified by MRN, date of birth, ID band Patient awake    Reviewed: Allergy & Precautions, NPO status , Patient's Chart, lab work & pertinent test results  Airway Mallampati: II  TM Distance: >3 FB Neck ROM: Full    Dental no notable dental hx.    Pulmonary asthma ,    Pulmonary exam normal        Cardiovascular hypertension,  Rhythm:Regular Rate:Normal     Neuro/Psych Anxiety negative neurological ROS     GI/Hepatic Neg liver ROS, PUD,   Endo/Other  negative endocrine ROS  Renal/GU negative Renal ROS  negative genitourinary   Musculoskeletal negative musculoskeletal ROS (+)   Abdominal Normal abdominal exam  (+)   Peds  Hematology negative hematology ROS (+)   Anesthesia Other Findings   Reproductive/Obstetrics (+) Pregnancy                             Anesthesia Physical Anesthesia Plan  ASA: 2  Anesthesia Plan: Spinal   Post-op Pain Management:    Induction:   PONV Risk Score and Plan: 2 and Ondansetron and Treatment may vary due to age or medical condition  Airway Management Planned: Natural Airway, Simple Face Mask and Nasal Cannula  Additional Equipment: None  Intra-op Plan:   Post-operative Plan:   Informed Consent: I have reviewed the patients History and Physical, chart, labs and discussed the procedure including the risks, benefits and alternatives for the proposed anesthesia with the patient or authorized representative who has indicated his/her understanding and acceptance.     Dental advisory given  Plan Discussed with: CRNA  Anesthesia Plan Comments: (Lab Results      Component                Value               Date                      WBC                      9.0                 10/26/2021                HGB                      11.6 (L)            10/26/2021                HCT                      33.3 (L)             10/26/2021                MCV                      91.5                10/26/2021                PLT                      233  10/26/2021          )        Anesthesia Quick Evaluation

## 2021-10-28 NOTE — Transfer of Care (Signed)
Immediate Anesthesia Transfer of Care Note  Patient: Kathleen Hamilton  Procedure(s) Performed: CESAREAN SECTION MULTI-GESTATIONAL  Patient Location: PACU  Anesthesia Type:Spinal  Level of Consciousness: awake, alert  and oriented  Airway & Oxygen Therapy: Patient Spontanous Breathing  Post-op Assessment: Report given to RN and Post -op Vital signs reviewed and stable  Post vital signs: Reviewed and stable  Last Vitals:  Vitals Value Taken Time  BP 134/90 10/28/21 1320  Temp    Pulse 61 10/28/21 1326  Resp 17 10/28/21 1326  SpO2 100 % 10/28/21 1326  Vitals shown include unvalidated device data.  Last Pain:  Vitals:   10/28/21 0956  TempSrc: Oral  PainSc:          Complications: No notable events documented.

## 2021-10-28 NOTE — Lactation Note (Addendum)
This note was copied from Hamilton Kathleen's chart. Lactation Consultation Note  Patient Name: Kathleen Hamilton [id number] Date: 10/28/2021 Reason for consult: Initial assessment;Early term 37-38.6wks;Other (Comment);Infant < 6lbs;Multiple gestation (C/S delivery) Age:25 hours, multiples see Birth Parent MR- Hx PIH.  Per Birth Parent, she is breastfeeding and supplementing multiples with formula for each feeding with  ( 22 kcal formula), this is her choice. Birth Parent feeding plan  is to latch one infant  per feeding while solely supplementing the other infant during the same feeding.  Birth Parent are alternating multiples feedings  so that they are latching at the breast every other feeding ( her choice).  Birth Parent was made aware of O/P services, breastfeeding support groups, community resources, and our phone # for post-discharge questions.   Kathleen Hamilton (Hamilton) Per Birth Parent, Kathleen Hamilton had two stools since birth. Latched on Birth Parent's left breast using the football hold position, infant sustained latch and BF for 15 minutes. Afterwards Support Person pace feed infant , and supplemented infant with 22 kcal formula infant would only take 7 mls during this feeding. While Support Person was pace feeding infant Birth Parent was using the DEBP, plans to continue to pump every 3 hours for 15 minutes on initial setting. Kathleen Hamilton ( Hamilton) Per Birth Parent,  Kathleen Hamilton had one void and one stool since birth. While Birth Parent had Kathleen Hamilton at the breast , Support Person formula feed  Kathleen Hamilton with  23 mls ( 22 kcal) formula. LC reminded Support Person that infant's tummy size is small and to pace feed infant offering max 15 or less  mls when infant is not latched at the breast, when less than 24 hours of age.  Birth Parent feeding plan: 1- Plans to continue to alternate feedings with multiples, Latching one at Hamilton feeding and supplementing the other with formula, feeding by cues, 8+ times within 24 hours, STS. 2-  Continue to use DEBP every 3 hours for 15 minutes on initial setting, and offer multiples any EBM first before formula. 3- Continue to supplement multiples for all feedings, based on their age if latched to offer 10 -11 mls or more per feeding ( EBM/Formula). 4- Birth Parent knows to call RN/LC if their are any BF questions, concerns or if latch assistance is needed.  Maternal Data Has patient been taught Hand Expression?: No Does the patient have breastfeeding experience prior to this delivery?: Yes How long did the patient breastfeed?: Birth Parent, BF 1st child for 3 months who was ETI female infant,  she is currently 46 years old.  Feeding Mother's Current Feeding Choice: Breast Milk and Formula  LATCH Score( This Latch Score is: (Kathleen Hamilton Hamilton only) Latch: Grasps breast easily, tongue down, lips flanged, rhythmical sucking.  Audible Swallowing: Hamilton few with stimulation  Type of Nipple: Everted at rest and after stimulation  Comfort (Breast/Nipple): Soft / non-tender  Hold (Positioning): Assistance needed to correctly position infant at breast and maintain latch.  LATCH Score: 8   Lactation Tools Discussed/Used Tools: Pump;Flanges Flange Size: 24 Breast pump type: Double-Electric Breast Pump Pump Education: Setup, frequency, and cleaning;Milk Storage Reason for Pumping: Mutiples, ETI infant's less than 6 lbs Pumping frequency: Use DEBP every 3 hours for 15 minutes on inital setting.  Interventions Interventions: Breast feeding basics reviewed;Assisted with latch;Skin to skin;Breast compression;Adjust position;Support pillows;Position options;DEBP;Education;LC Services brochure  Discharge Pump: Personal (Birth Parent has Spectra 2 DEBP at home.)  Consult Status Consult Status: Follow-up Date:  10/29/21 Follow-up type: In-patient    Kathleen Hamilton 10/28/2021, 5:34 PM

## 2021-10-28 NOTE — Anesthesia Procedure Notes (Signed)
Spinal  Patient location during procedure: OR Start time: 10/28/2021 11:02 AM End time: 10/28/2021 11:04 AM Staffing Performed: anesthesiologist  Anesthesiologist: Darral Dash, DO Performed by: Darral Dash, DO Authorized by: Darral Dash, DO   Preanesthetic Checklist Completed: patient identified, IV checked, site marked, risks and benefits discussed, surgical consent, monitors and equipment checked, pre-op evaluation and timeout performed Spinal Block Patient position: sitting Prep: DuraPrep Patient monitoring: heart rate, cardiac monitor, continuous pulse ox and blood pressure Approach: midline Location: L3-4 Injection technique: single-shot Needle Needle type: Pencan  Needle gauge: 24 G Needle length: 10 cm Assessment Events: CSF return Additional Notes Patient identified. Risks/Benefits/Options discussed with patient including but not limited to bleeding, infection, nerve damage, paralysis, failed block, incomplete pain control, headache, blood pressure changes, nausea, vomiting, reactions to medications, itching and postpartum back pain. Confirmed with bedside nurse the patient's most recent platelet count. Confirmed with patient that they are not currently taking any anticoagulation, have any bleeding history or any family history of bleeding disorders. Patient expressed understanding and wished to proceed. All questions were answered. Sterile technique was used throughout the entire procedure. Please see nursing notes for vital signs. Warning signs of high block given to the patient including shortness of breath, tingling/numbness in hands, complete motor block, or any concerning symptoms with instructions to call for help. Patient was given instructions on fall risk and not to get out of bed. All questions and concerns addressed with instructions to call with any issues or inadequate analgesia.

## 2021-10-28 NOTE — Progress Notes (Signed)
Bedside US showed Twin A in breech position. B is transverse.   Feliz Beam, MD, [place] for [facility])

## 2021-10-28 NOTE — H&P (Addendum)
LABOR AND DELIVERY ADMISSION HISTORY AND PHYSICAL NOTE  Kathleen Hamilton is a 26 y.o. female G107P1011 with dichorionic/diamniotic twin IUP at 61w0dby LMP presenting for scheduled Cesarean section due to breech presentation of Baby A   She reports positive fetal movement. She denies leakage of fluid or vaginal bleeding.   She plans on breast and bottle feeding. She requests POPs for birth control.  [facility]  [facility][redacted]w[redacted]d , CWD,  Baby A: normal anatomy, complete breech right,  2289g, 16.7%ile, female Baby B: normal anatomy,  cephalic  left presentation, 2243g, 183.2%PQD Pregnancy complications:  - Multifetal gestation - history of gestational hypertension.   Past Medical History: Past Medical History:  Diagnosis Date   Anxiety    Asthma    Dysmenorrhea    Endometriosis    Heart murmur    Pregnancy induced hypertension    [place]     Past Surgical History: Past Surgical History:  Procedure Laterality Date   BIOPSY BOWEL     COLON BIOPSY     hx of colon problems.  ? mega colon, awaiting bx results   NO PAST SURGERIES      Obstetrical History: OB History     Gravida  3   Para  1   Term  1   Preterm      AB  1   Living  1      SAB  1   IAB      Ectopic      Multiple  0   Live Births  1           Social History: Social History   Socioeconomic History   Marital status: Significant Other    Spouse name: Not on file   Number of children: Not on file   Years of education: Not on file   Highest education level: Not on file  Occupational History   Not on file  Tobacco Use   Smoking status: Never   Smokeless tobacco: Never  Vaping Use   Vaping Use: Never used  Substance and Sexual Activity   Alcohol use: No   Drug use: No   Sexual activity: Not Currently    Birth control/protection: None  Other Topics Concern   Not on file  Social History Narrative   Not on file   Social  Determinants of Health   Financial Resource Strain: Low Risk  (05/06/2021)   Overall Financial Resource Strain (CARDIA)    Difficulty of Paying Living Expenses: Not hard at all  Food Insecurity: No Food Insecurity (05/06/2021)   Hunger Vital Sign    Worried About Running Out of Food in the Last Year: Never true    Ran Out of Food in the Last Year: Never true  Transportation Needs: No Transportation Needs (05/06/2021)   PRAPARE - THydrologist(Medical): No    Lack of Transportation (Non-Medical): No  Physical Activity: Insufficiently Active (05/06/2021)   Exercise Vital Sign    Days of Exercise per Week: 3 days    Minutes of Exercise per Session: 30 min  Stress: No Stress Concern Present (05/06/2021)   [place]   Feeling of Stress : Only a little  Social Connections: Moderately Isolated (05/06/2021)   Social Connection and Isolation Panel [NHANES]    Frequency of Communication with Friends and Family: More than three times a week  Frequency of Social Gatherings with Friends and Family: Twice a week    Attends Religious Services: Never    Marine scientist or Organizations: No    Attends Music therapist: Never    Marital Status: Living with partner    Family History: Family History  Problem Relation Age of Onset   Healthy Mother    Healthy Father    GI problems Brother    Diabetes Maternal Grandmother    Hypertension Maternal Grandmother    Heart disease Maternal Grandmother    Breast cancer Maternal Grandmother        unsure age of onset   Diabetes Paternal Grandmother    Heart disease Paternal Grandmother    Hypertension Paternal Grandmother    Diabetes Paternal Grandfather    Heart disease Paternal Grandfather    Hypertension Paternal Grandfather     Allergies: Allergies  Allergen Reactions   Aspirin Other (See Comments)     Stomach bleeding    Fish  Allergy Diarrhea, Nausea And Vomiting, Swelling and Other (See Comments)    Stomach pain.   Naproxen Other (See Comments)    stomach bleeding    Nsaids Other (See Comments)    GI BLEED   Shellfish Allergy Diarrhea, Nausea And Vomiting, Swelling and Rash    Medications Prior to Admission  Medication Sig Dispense Refill Last Dose   acetaminophen (TYLENOL) 500 MG tablet Take 500-1,000 mg by mouth every 6 (six) hours as needed (pain.).      calcium carbonate (TUMS - DOSED IN MG ELEMENTAL CALCIUM) 500 MG chewable tablet Chew 1-2 tablets by mouth 3 (three) times daily as needed for indigestion or heartburn.      famotidine (PEPCID) 20 MG tablet Take 1 tablet (20 mg total) by mouth 2 (two) times daily. (Patient taking differently: Take 20 mg by mouth 2 (two) times daily as needed for heartburn or indigestion.) 30 tablet 4    FLUoxetine (PROZAC) 20 MG capsule Take 1 capsule (20 mg total) by mouth daily. 30 capsule 3    ondansetron (ZOFRAN-ODT) 8 MG disintegrating tablet Take 0.5 tablets (4 mg total) by mouth every 8 (eight) hours as needed for nausea or vomiting. 20 tablet 0    Prenatal Vit-Fe Fumarate-FA (PRENATAL VITAMIN PO) Take 1 tablet by mouth every evening.        Review of Systems  All systems reviewed and negative except as stated in HPI  Physical Exam Blood pressure (!) 127/96, pulse 89, temperature 97.9 F (36.6 C), temperature source Oral, resp. rate 20, height 5' 5"  (1.651 m), weight 125.5 kg, last menstrual period 02/04/2021. General appearance: alert, oriented, NAD Lungs: normal respiratory effort Heart: regular rate Abdomen: soft, non-tender; gravid,  Extremities: No calf swelling or tenderness FHR: Twin A - 135bpm, Twin B - 125bpm  Prenatal labs: ABO, Rh: --/--/A POS (09/12 1034) Antibody: NEG (09/12 1034) Rubella: 4.27 (03/23 1213) RPR: NON REACTIVE (09/12 1020)  HBsAg: Negative (03/23 1213)  HIV: Non Reactive (07/07 0936)  GC/Chlamydia:  Neisseria Gonorrhea  Date  Value Ref Range Status  10/08/2021 Negative  Final   Chlamydia  Date Value Ref Range Status  10/08/2021 Negative  Final    GBS: Negative/-- (08/25 1400)  2-hr GTT: normal Genetic screening:  low risk Anatomy US: Anatomy  Prenatal Transfer Tool  Maternal Diabetes: No Genetic Screening: Normal Maternal Ultrasounds/Referrals: Isolated choroid plexus cyst, initally on baby A Fetal Ultrasounds or other Referrals:  None Maternal Substance Abuse:  No Significant Maternal  Medications:  None Significant Maternal Lab Results: Group B Strep negative  No results found for this or any previous visit (from the past 24 hour(s)).  Patient Active Problem List   Diagnosis Date Noted   Fetal malpresentation 10/28/2021   Anxiety 05/06/2021   Supervision of high-risk pregnancy 05/06/2021   History of gestational hypertension 05/06/2021   Dichorionic diamniotic twin gestation 05/06/2021   History of prior pregnancy with IUGR newborn 05/06/2021   Sickle cell trait ([place]) 05/06/2021   VISUAL ACUITY, DECREASED 04/19/2010   OVERWEIGHT 04/15/2010   DYSMENORRHEA 04/15/2010   MENORRHAGIA 04/15/2010    Assessment: Eden Toohey is a 26 y.o. G3P1011 at 72w0dhere for scheduled priamary C-section due to malpresentation of Baby A in a twin gestation. No acute concerns noted.  #Primary CS:  The risks of cesarean section discussed with the patient included but were not limited to: bleeding which may require transfusion or reoperation; infection which may require antibiotics; injury to bowel, bladder, ureters or other surrounding organs; injury to the fetus; need for additional procedures including hysterectomy in the event of a life-threatening hemorrhage; placental abnormalities with subsequent pregnancies, incisional problems, thromboembolic phenomenon and other postoperative/anesthesia complications. The patient concurred with the proposed plan, giving informed written consent for the procedure. Patient  has been NPO since last night she will remain NPO for procedure. Anesthesia and OR aware. Preoperative prophylactic antibiotics and SCDs ordered on call to the OR. To OR when ready.   #Anesthesia: Spinal #FWB: FHR normal for both babies #GBS/ID: GBS negative #MOF: breast and bottle feeding #MOC: PoPs #Circ: yes, for baby B  #history of gHTN: watch blood pressures closely postpartum  CLiliane ChannelMD MPH OB Fellow, [facility]antfor [facility] 10/28/2021

## 2021-10-28 NOTE — Op Note (Cosign Needed)
Kathleen Hamilton PROCEDURE DATE: 10/28/2021  PREOPERATIVE DIAGNOSES: Intrauterine multifetal (twin) pregnancy at 52w0dweeks gestation; malpresentation: of twin A  POSTOPERATIVE DIAGNOSES: The same  PROCEDURE: Primary Low Transverse Cesarean Section  SURGEON:  KFeliz Beam MD  ASSISTANT:  CStormy Card MD  An experienced assistant was required given the standard of surgical care given the complexity of the case.  This assistant was needed for exposure, dissection, suctioning, retraction, instrument exchange, assisting with delivery with administration of fundal pressure, and for overall help during the procedure.  ANESTHESIOLOGY TEAM: Anesthesiologist: [place], GMarch Rummage DO CRNA: BValda Favia CRNA; FFlossie Dibble CRNA  INDICATIONS: Kathleen Hamilton a 26y.o. G3P1011 at 362w0dere for cesarean section secondary to the indications listed under preoperative diagnoses; please see preoperative note for further details.  The risks of cesarean section were discussed with the patient including but were not limited to: bleeding which may require transfusion or reoperation; infection which may require antibiotics; injury to bowel, bladder, ureters or other surrounding organs; injury to the fetus; need for additional procedures including hysterectomy in the event of a life-threatening hemorrhage; placental abnormalities wth subsequent pregnancies, incisional problems, thromboembolic phenomenon and other postoperative/anesthesia complications.  She consents to blood transfusion in the event of an emergency. The patient verbalized understanding of the plan, giving informed written consent for the procedure.    FINDINGS:    Viable female infant in compound breech presentation, with R foot presenting along with buttock.  Apgars 8 and 9. Weight: 2490g. Clear amniotic fluid. Intact placenta, three vessel cord.    Viable female infant in cephalic presentation.  Apgars 8 and 9. Weight: 2210g.  Clear amniotic fluid. Intact placenta, three vessel cord.    Normal uterus, fallopian tubes and ovaries bilaterally.  ANESTHESIA: Spinal INTRAVENOUS FLUIDS: 1100 ml   ESTIMATED BLOOD LOSS: 1025 ml URINE OUTPUT:  400 ml SPECIMENS: Placenta sent to Labor and delivery.  COMPLICATIONS: None immediate  PROCEDURE IN DETAIL:  The patient preoperatively received intravenous antibiotics and had sequential compression devices applied to her lower extremities.  She was then taken to the operating room where spinal anesthesia was administered and was found to be adequate. She was then placed in a dorsal supine position with a leftward tilt, and a Traxi retraction device was placed to good effect on the pannus/abdomen. She was prepped and draped in a sterile manner.  A foley catheter was placed into her bladder with sterile technique and attached to constant gravity.  After a timeout was performed, a Pfannenstiel skin incision was made with scalpel and carried through to the underlying layer of fascia. The fascia was incised in the midline, and this incision was extended bilaterally using blunt dissection.  Kocher clamps were applied to the superior aspect of the fascial incision and the underlying rectus muscles were dissected off sharply.  A similar process was carried out on the inferior aspect of the fascial incision. The rectus muscles were separated in the midline bluntly and the peritoneum was entered bluntly. The peritoneal incision was carefully extended bluntly laterally and caudad with good visualization of the bladder. The uterus appeared normal. The Alexis O-ring retractor was placed into the incision, taking care not to incorporate bowel or omentum. The bladder blade was inserted. Attention was turned to the lower uterine segment where a low transverse hysterotomy was made with a scalpel and extended bilaterally bluntly. Amniotic sac for baby A was ruptured using an allis forceps.  Baby A's buttock was  presenting, along  with its right foot. The buttock was lifted to the hysterotomy site and delivered, followed by the right and then left lower limbs, following this, the trunk was delivered, by applying gentle traction on the hips. The right shoulder was delivered, and the right upper extremity followed. After this, baby A was then rotated to bring the left shoulder to the hysterotomy. The left shoulder and left upper extremity delivered easily. The baby's body was then lifted vertically, aiding the delivery of the head. The cord was clamped and cut immediately, The infant was then handed over to the waiting neonatology team.   The gestation al sac for baby B was ruptured using an allis forceps. Baby B's head was noted to be presenting. This was brought to the hysterotomy and with fundal pressure delivered, followed by the rest of the body.  Uterine massage was then performed, and the placenta delivered intact, with both umbilical cords attached. Both cords were noted to be three-vessel cords. The uterus was then cleared of clots and debris.  The hysterotomy was closed with 0 Vicryl in a running unlocked fashion, and an imbricating layer was also placed with 0 Vicryl. The fallopian tubes and ovaries were visualized bilaterally and were normal appearing. The uterus was then gently replaced within the abdomen. The Alexis retractor was removed.  The pelvis was cleared of all clot and debris. Hemostasis was again confirmed on all surfaces. The peritoneum was re-approximated using 0 Vicryl suture. The fascia was then closed using looped PDS in a running fashion.  The subcutaneous layer was irrigated, then reapproximated with 2-0 plain gut. The skin was closed with a 4-0 Monocryl subcuticular stitch. The patient tolerated the procedure well. Sponge, lap, instrument and needle counts were correct x 3.  She was taken to the recovery room in stable condition.   Liliane Channel MD MPH OB Fellow, [place] for [place] 10/28/2021

## 2021-10-28 NOTE — Anesthesia Postprocedure Evaluation (Signed)
Anesthesia Post Note  Patient: Kathleen Hamilton  Procedure(s) Performed: CESAREAN SECTION MULTI-GESTATIONAL     Patient location during evaluation: PACU Anesthesia Type: Spinal Level of consciousness: oriented and awake and alert Pain management: pain level controlled Vital Signs Assessment: post-procedure vital signs reviewed and stable Respiratory status: spontaneous breathing, respiratory function stable and patient connected to nasal cannula oxygen Cardiovascular status: blood pressure returned to baseline and stable Postop Assessment: no headache, no backache and no apparent nausea or vomiting Anesthetic complications: no   No notable events documented.  Last Vitals:  Vitals:   10/28/21 1503 10/28/21 1610  BP: 134/75 128/68  Pulse: (!) 55 (!) 55  Resp: 16 18  Temp: (!) 36.3 C 36.9 C  SpO2: 98% 98%    Last Pain:  Vitals:   10/28/21 1610  TempSrc: Oral  PainSc: 2                  March Rummage Ever Halberg

## 2021-10-28 NOTE — Discharge Summary (Shared)
Postpartum Discharge Summary  Date Hamilton Service updated***     Patient Name: Kathleen Hamilton DOB: 03-24-1995 MRN: [id number]  Date Hamilton admission: 10/28/2021 Delivery date:   Kathleen, Hamilton Kathleen Hamilton [[id number]]  10/28/2021    Kathleen, Hamilton Kathleen Hamilton [[id number]]  10/28/2021  Kathleen provider:    Avriel, Kathleen Hamilton [[id number]]  Kathleen, Hamilton Kathleen Hamilton [[id number]]    Date Hamilton discharge: 10/28/2021  Admitting diagnosis: Fetal malpresentation [O32.9XX0] Intrauterine pregnancy: [redacted]w[redacted]d    Secondary diagnosis:  Principal Problem:   Status post primary low transverse cesarean section Active Problems:   Fetal malpresentation   Twin delivery by C-section  Additional problems: ***    Discharge diagnosis: Term Twin Pregnancy Delivered and ***                                              Post partum procedures:{Postpartum procedures:23558} Augmentation: N/A Complications: [id number] Hospital course: Sceduled C/S   26y.o. yo G3P1011 at 378w0dith twin gestation was admitted to the hospital 10/28/2021 for scheduled cesarean section with the following indication:Multifetal Gestation with malpresentation Hamilton Baby A. Delivery details are as follows:  Membrane Rupture Time/Date:    Kathleen, [facility][[id number]]11:46 AM    Kathleen, [place][[id number]]11:47 AM ,   Kathleen, [place][[id number]]10/28/2021    Kathleen, [place][[id number]]10/28/2021   [facility]:   [id number], Low Transverse    Kathleen, [place][[id number]]C-Section, Low Transverse  Details Hamilton operation can be found in separate operative note.  Patient had an uncomplicated postpartum course.  She is ambulating, tolerating a regular diet, passing flatus, and urinating well. Patient is discharged home in stable condition on  10/28/21        Newborn Data: Birth date:   Kathleen, [place][[id number]]10/28/2021    Kathleen, [place]0[[id number]]10/28/2021  Birth time:   Kathleen, [place]0[[id number]]11:46 AM    Kathleen, [place]0[[id number]]11:47 AM  Gender:   Kathleen, Abdullah0[[id number]]Kathleen Hamilton    Kathleen, NicklinrNey0[[id number]]Kathleen Hamilton  Kathleen status:   WhRoshanda, Balazs0[[id number]]  Kathleen Hamilton  Kathleen, [place]0[[id number]]Kathleen  Apgars:   WhShantera, [place]0[[id number]]8 [place]0[[id number]]  7   Kathleen, [place]0[[id number]]9 9187 Kathleen DriverEpworth0[[id number]]9  Kathleen:   WhPaislyn, DomenicorAtalissa0[[id number]]  9574    Kathleen, [place]0[[id number]]  4383     Magnesium Sulfate received: No BMZ received: No Rhophylac:N/A MMR:N/A, Immune T-DaP:Given prenatally Flu: {F{KFM:40375}ransfusion:{Transfusion received:30440034}  Physical exam  Vitals:   10/28/21 0956 10/28/21 1320 10/28/21 1321 10/28/21 1334  BP: 127/69 (!) 134/90  (!) 105/57  Pulse: 80  66 (!) 57  Resp: 20 16  15   Temp: 97.8 F (36.6 C) 98.1 F (36.7 C)    TempSrc: Oral Oral    SpO2: 99%  97% 96%  Kathleen:      Height:       General: {Exam; general:21111117} Lochia: {Desc; appropriate/inappropriate:30686::"appropriate"} Uterine Fundus: {Desc; firm/soft:30687} Incision: {Exam; incision:21111123} DVT Evaluation: {Exam; dvOHK:0677034}abs: Lab Results  Component Value Date   WBC  9.0 10/26/2021   HGB 11.6 (L) 10/26/2021   HCT 33.3 (L) 10/26/2021   MCV 91.5 10/26/2021   PLT 233 10/26/2021      Latest Ref Rng & Units 10/13/2021   10:41 AM  CMP  Glucose 70 - 99 mg/dL 79   BUN 6 - 20 mg/dL 7   Creatinine 0.57 - 1.00 mg/dL 0.64   Sodium 134 - 144 mmol/L 140   Potassium 3.5 - 5.2 mmol/L 4.2   Chloride 96 - 106 mmol/L 109   CO2 20 - 29 mmol/L 16   Calcium 8.7 - 10.2 mg/dL 9.1   Total Protein 6.0 - 8.5 g/dL 6.6   Total Bilirubin 0.0 - 1.2 mg/dL 0.5   Alkaline Phos 44 - 121 IU/L 145   AST 0 - 40 IU/L 17   ALT 0 - 32 IU/L 10    Edinburgh  Score:    07/12/2018    8:25 AM  Edinburgh Postnatal Depression Scale Screening Tool  I have been able to laugh and see the funny side Hamilton things. 0  I have looked forward with enjoyment to things. 0  I have blamed myself unnecessarily when things went wrong. 1  I have been anxious or worried for no good reason. 2  I have felt scared or panicky for no good reason. 0  Things have been getting on top Hamilton me. 1  I have been so unhappy that I have had difficulty sleeping. 0  I have felt sad or miserable. 1  I have been so unhappy that I have been crying. 1  The thought Hamilton harming myself has occurred to me. 0  Edinburgh Postnatal Depression Scale Total 6     Kathleen visit meds:  Allergies as Hamilton 10/28/2021       Reactions   Aspirin Other (See Comments)    Stomach bleeding    Fish Allergy Diarrhea, Nausea And Vomiting, Swelling, Other (See Comments)   Stomach pain.   Naproxen Other (See Comments)   stomach bleeding    Nsaids Other (See Comments)   GI BLEED   Shellfish Allergy Diarrhea, Nausea And Vomiting, Swelling, Rash     Med Rec must be completed prior to using this [place]***        Discharge home in stable condition Infant Feeding: {Baby feeding:23562} Infant Disposition:{CHL IP OB HOME WITH XKGYJE:56314} Discharge instruction: per Kathleen Visit Summary and Postpartum booklet. Activity: Advance as tolerated. Pelvic rest for 6 weeks.  Diet: {OB HFWY:63785885} Future Appointments: Future Appointments  Date Time Provider [place] Hamilton  11/04/2021  2:30 PM Kathleen Hamilton, CNM [facility]  12/02/2021 10:30 AM Kathleen Hamilton, Kathleen Hamilton, CNM [facility]   Follow up Visit:  The following message was sent to [facility] by Kathleen Santee, MD Please schedule this patient for a In person postpartum visit in 4 weeks with the following provider: MD. Additional Postpartum F/U:Incision check 1 week and BP check 1 week  High risk pregnancy complicated by:  twin  gestation, prior history Hamilton gHTN Delivery mode:     Kathleen, Hamilton [[id number]]  C-Section, Low Transverse    Kathleen, Hamilton [place] [[id number]]  C-Section, Low Transverse  Anticipated Birth Control:  POPs   10/28/2021 Kathleen Card, MD

## 2021-10-29 LAB — CBC
HCT: 25.6 % — ABNORMAL LOW (ref 36.0–46.0)
Hemoglobin: 9.1 g/dL — ABNORMAL LOW (ref 12.0–15.0)
MCH: 32.2 pg (ref 26.0–34.0)
MCHC: 35.5 g/dL (ref 30.0–36.0)
MCV: 90.5 fL (ref 80.0–100.0)
Platelets: 198 10*3/uL (ref 150–400)
RBC: 2.83 MIL/uL — ABNORMAL LOW (ref 3.87–5.11)
RDW: 15.1 % (ref 11.5–15.5)
WBC: 14.6 10*3/uL — ABNORMAL HIGH (ref 4.0–10.5)
nRBC: 0 % (ref 0.0–0.2)

## 2021-10-29 MED ORDER — SODIUM CHLORIDE 0.9 % IV SOLN
500.0000 mg | Freq: Once | INTRAVENOUS | Status: AC
  Administered 2021-10-29: 500 mg via INTRAVENOUS
  Filled 2021-10-29: qty 500

## 2021-10-29 MED ORDER — OXYCODONE HCL 5 MG PO TABS
10.0000 mg | ORAL_TABLET | Freq: Four times a day (QID) | ORAL | Status: DC | PRN
  Administered 2021-10-29 – 2021-10-30 (×3): 10 mg via ORAL
  Filled 2021-10-29 (×3): qty 2

## 2021-10-29 MED ORDER — FERROUS SULFATE 325 (65 FE) MG PO TABS
325.0000 mg | ORAL_TABLET | ORAL | Status: DC
  Administered 2021-10-29: 325 mg via ORAL
  Filled 2021-10-29: qty 1

## 2021-10-29 MED ORDER — ACETAMINOPHEN 500 MG PO TABS
1000.0000 mg | ORAL_TABLET | Freq: Four times a day (QID) | ORAL | Status: DC | PRN
  Administered 2021-10-29 – 2021-10-30 (×4): 1000 mg via ORAL
  Filled 2021-10-29 (×4): qty 2

## 2021-10-29 NOTE — Progress Notes (Signed)
POSTPARTUM PROGRESS NOTE  Subjective: Kathleen Hamilton is a 26 y.o. G3P1011 s/p pLTCS at [redacted]w[redacted]d  She reports she is doing well. No acute events overnight. She denies any problems with ambulating, voiding or po intake. Denies nausea or vomiting. She has passed flatus. She has not had bowel movement. Pain is well controlled. Lochia is Small.  Denies headache, scotomata, and RUQ pain Denies dizziness, dyspnea, and chest palpitation  Objective: BP 116/68 (BP Location: Right Arm)   Pulse 74   Temp 98 F (36.7 C) (Oral)   Resp 17   Ht 5' 5"  (1.651 m)   Wt 125.5 kg   LMP 02/04/2021   SpO2 97%   BMI 46.03 kg/m   Physical Exam:  General: alert, cooperative and no distress Chest: no respiratory distress Abdomen: soft, appropriately tender  incision wound vac in place  Uterine Fundus: firm, appropriately tender Extremities: No calf swelling or tenderness  no peripheral edema  Recent Labs    10/26/21 1020 10/29/21 0518  HGB 11.6* 9.1*  HCT 33.3* 25.6*    Assessment/Plan: BTyshia Fenteris a 26y.o. G3P1011 s/p pLTCS at 364w1dor breech baby A of di-di twin gestation.  POD#1: Doing well, pain well-controlled. H/H 11.6-->9.1 -- Routine postpartum care, lactation support -- Encouraged up OOB -- Lovenox for VTE prophylaxis -- Anemia: IV Venofer -- Contraception: oral progesterone-only contraceptive  -- Feeding: breast feeding and bottle feeding -- Circumcision: yes  Dispo: Plan for discharge, likely tomorrow 10/30/21.  MiSalvadore OxfordMD, PGY-1 [facility] 7:48 AM 10/29/2021

## 2021-10-29 NOTE — Social Work (Signed)
MOB was referred for history of depression/anxiety.  * Referral screened out by Clinical Social Worker because none of the following criteria appear to apply:  ~ History of anxiety/depression during this pregnancy, or of post-partum depression following prior delivery.  ~ Diagnosis of anxiety and/or depression within last 3 years OR * MOB's symptoms currently being treated with medication and/or therapy.  Please contact the Clinical Social Worker if needs arise or by MOB request.  Letta Kocher, [place] Social Worker [phone]

## 2021-10-30 MED ORDER — AMLODIPINE BESYLATE 5 MG PO TABS: 5.0000 mg | ORAL_TABLET | Freq: Every day | ORAL | 0 refills | Status: DC

## 2021-10-30 MED ORDER — POLYETHYLENE GLYCOL 3350 17 G PO PACK: 17.0000 g | PACK | Freq: Every day | ORAL | 0 refills | Status: DC

## 2021-10-30 MED ORDER — FUROSEMIDE 20 MG PO TABS: 20.0000 mg | ORAL_TABLET | Freq: Every day | ORAL | 0 refills | Status: DC

## 2021-10-30 MED ORDER — NORETHINDRONE 0.35 MG PO TABS: 1.0000 | ORAL_TABLET | Freq: Every day | ORAL | 11 refills | Status: DC

## 2021-10-30 MED ORDER — OXYCODONE HCL 10 MG PO TABS: 5.0000 mg | ORAL_TABLET | Freq: Four times a day (QID) | ORAL | 0 refills | Status: DC | PRN

## 2021-10-30 NOTE — Lactation Note (Signed)
This note was copied from a baby's chart. Lactation Consultation Note  Patient Name: Kathleen Hamilton JQGBE'E Date: 10/30/2021 Reason for consult: Follow-up assessment;Early term 37-38.6wks;Infant < 6lbs;Multiple gestation Age:25 hours  Mom has shallow, scabbed cracks at base of nipples. RN to give coconut oil to Mom.   Mom was observed pumping; she had been using size 27 & size 30 flanges. However, at this time, her L breast is a size 24 and her R breast is a 27.   I assisted in helping Twin A "Kathleen Hamilton" feed. She did very well with the Enfamil Extra-Slow Flow nipple. Mom has Mam bottles at home. She knows that the Mam Level 0 is equivalent to the Enfamil extra-slow flow nipple. Twin B slept through consult.    Parents' questions were answered to their satisfaction. Discussed pacifier use  Volumes for today were written on board :  Baby A: 23-24 mL/feeding Baby B: 22 mL/feeding  Parents know to advance feedings as tolerated.    Maternal Data How long did the patient breastfeed?: 3.5 months  Feeding Mother's Current Feeding Choice: Breast Milk and Formula    Lactation Tools Discussed/Used Tools: Pump Breast pump type: Double-Electric Breast Pump Pumping frequency: q3h Pumped volume: 5 mL  Interventions Interventions: Pace feeding;Education;DEBP  Discharge Pump: Personal  Consult Status Consult Status: Complete    Larkin Ina 10/30/2021, 12:02 PM

## 2021-10-30 NOTE — Lactation Note (Signed)
This note was copied from a baby's chart. Lactation Consultation Note  Patient Name: Kathleen Hamilton BULAG'T Date: 10/30/2021 Reason for consult: Follow-up assessment;Mother's request;Difficult latch;Early term 37-38.6wks;1st time breastfeeding;Infant < 6lbs;Multiple gestation (PIH, C/S delivery, multiples are being supplemented with 22 kcal NeoSure formula per feedings.) Age:26 years Birth Parent  has been soley formula feeding see flow sheet doc.  Per Birth Parent, she did  not latched multiples( twins)  to breast since yesterday due to  having sore nipple with a nipple stripe on her left breast,. Per Birth Parent, she doesn't want to latch multiples at breast tonight plans to use  " DEBP only ."and continue to feed infant any EBM first and then formula.   LC did observe Birth Parent does have areola edema on her left breast. LC re-fitted Birth Parent breast flanges:  30 mm on her right breast and 27 mm on her left breast. Birth Parent  was using the DEBP while LC was in the room,plans to pump every 3 hours for 15 minutes and give multiples any EBM first and then formula. LC discussed with RN to give Birth Parent coconut oil to help with breast soreness.  Per Support Person , Baby A and Baby B are consuming 17 to 20 mls ( 22 kcal formula) per feeding on day 2 of life.  Baby A consumed 18 mls of formula while LC was in room was being bottle feed by Support Person while Baby B was asleep at this time.  Birth Parent knows if multiples want more EBM/ Formula  to offer more with pace feeding.  Birth Parent will continue to feed multiples on demand,8+ times within 24 hours. Birth Parent knows to ask RN/LC for latch assistance when she decides to latch multiples at the breast again. Maternal Data    Feeding Mother's Current Feeding Choice: Breast Milk and Formula Nipple Type: Slow - flow  LATCH Score                    Lactation Tools Discussed/Used    Interventions     Discharge    Consult Status Consult Status: Follow-up Date: 10/30/21 Follow-up type: In-patient    [name] 10/30/2021, 12:33 AM

## 2021-10-31 ENCOUNTER — Encounter: Payer: Self-pay | Admitting: Obstetrics & Gynecology

## 2021-11-01 ENCOUNTER — Inpatient Hospital Stay (HOSPITAL_COMMUNITY)
Admission: AD | Admit: 2021-11-01 | Discharge: 2021-11-01 | Disposition: A | Discharge status: Home / Self Care | Payer: Medicaid Other | Attending: Obstetrics & Gynecology | Admitting: Obstetrics & Gynecology

## 2021-11-01 ENCOUNTER — Other Ambulatory Visit: Payer: Medicaid Other

## 2021-11-01 ENCOUNTER — Encounter: Payer: Medicaid Other | Admitting: Women's Health

## 2021-11-01 ENCOUNTER — Encounter (HOSPITAL_COMMUNITY): Payer: Self-pay | Admitting: Obstetrics & Gynecology

## 2021-11-01 DIAGNOSIS — L231 Allergic contact dermatitis due to adhesives: Secondary | ICD-10-CM | POA: Diagnosis not present

## 2021-11-01 DIAGNOSIS — L239 Allergic contact dermatitis, unspecified cause: Secondary | ICD-10-CM

## 2021-11-01 DIAGNOSIS — R519 Headache, unspecified: Secondary | ICD-10-CM | POA: Diagnosis not present

## 2021-11-01 DIAGNOSIS — I10 Essential (primary) hypertension: Secondary | ICD-10-CM | POA: Insufficient documentation

## 2021-11-01 DIAGNOSIS — Z4800 Encounter for change or removal of nonsurgical wound dressing: Secondary | ICD-10-CM | POA: Diagnosis not present

## 2021-11-01 DIAGNOSIS — O9089 Other complications of the puerperium, not elsewhere classified: Secondary | ICD-10-CM | POA: Diagnosis present

## 2021-11-01 LAB — COMPREHENSIVE METABOLIC PANEL
ALT: 33 U/L (ref 0–44)
AST: 44 U/L — ABNORMAL HIGH (ref 15–41)
Albumin: 2.6 g/dL — ABNORMAL LOW (ref 3.5–5.0)
Alkaline Phosphatase: 93 U/L (ref 38–126)
Anion gap: 7 (ref 5–15)
BUN: 6 mg/dL (ref 6–20)
CO2: 23 mmol/L (ref 22–32)
Calcium: 8.7 mg/dL — ABNORMAL LOW (ref 8.9–10.3)
Chloride: 109 mmol/L (ref 98–111)
Creatinine, Ser: 0.57 mg/dL (ref 0.44–1.00)
GFR, Estimated: >60 mL/min (ref >60)
Glucose, Bld: 78 mg/dL (ref 70–99)
Potassium: 4.2 mmol/L (ref 3.5–5.1)
Sodium: 139 mmol/L (ref 135–145)
Total Bilirubin: 0.4 mg/dL (ref 0.3–1.2)
Total Protein: 6.3 g/dL — ABNORMAL LOW (ref 6.5–8.1)

## 2021-11-01 LAB — CBC
HCT: 28.8 % — ABNORMAL LOW (ref 36.0–46.0)
Hemoglobin: 10.1 g/dL — ABNORMAL LOW (ref 12.0–15.0)
MCH: 32.3 pg (ref 26.0–34.0)
MCHC: 35.1 g/dL (ref 30.0–36.0)
MCV: 92 fL (ref 80.0–100.0)
Platelets: 300 10*3/uL (ref 150–400)
RBC: 3.13 MIL/uL — ABNORMAL LOW (ref 3.87–5.11)
RDW: 16.3 % — ABNORMAL HIGH (ref 11.5–15.5)
WBC: 11 10*3/uL — ABNORMAL HIGH (ref 4.0–10.5)
nRBC: 0 % (ref 0.0–0.2)

## 2021-11-01 MED ORDER — ACETAMINOPHEN-CAFFEINE 500-65 MG PO TABS
2.0000 | ORAL_TABLET | Freq: Once | ORAL | Status: AC
  Administered 2021-11-01: 2 via ORAL
  Filled 2021-11-01: qty 2

## 2021-11-01 MED ORDER — MUPIROCIN 2 % EX OINT: TOPICAL_OINTMENT | Freq: Two times a day (BID) | CUTANEOUS | 0 refills | Status: DC

## 2021-11-01 MED ORDER — LORATADINE 10 MG PO TABS
10.0000 mg | ORAL_TABLET | Freq: Every day | ORAL | Status: DC
  Administered 2021-11-01: 10 mg via ORAL
  Filled 2021-11-01 (×2): qty 1

## 2021-11-01 NOTE — MAU Note (Signed)
.  Kathleen Hamilton is a 26 y.o. at Unknown here in MAU reporting: s/p c-section 10/28/21. Pt c/o headache this morning. Took b/p today and it was 138/102.  Took medication for it and has not gotten better. Reports rash all over her body that stared in hospital . Had taken benadryl and it had gone away. Benadryl is not helping now rash is back.   C-section incision pump air leak and beeping.   LMP:  Onset of complaint: last night Pain score: 8 Vitals:   11/01/21 1033  BP: 125/89  Pulse: 89  Resp: 18  Temp: 98.1 F (36.7 C)     FHT:n/a Lab orders placed from triage:

## 2021-11-01 NOTE — MAU Provider Note (Signed)
History     CSN: [id number]  Arrival date and time: 11/01/21 8828   Event Date/Time   First Provider Initiated Contact with Patient 11/01/21 1258      Chief Complaint  Patient presents with   Hypertension   Wound Check   Headache   [place], a  26 y.o. G3P2013 at 4 days PP presents to MAU with complaints of:   Headache- that started last night, has not subsided, no attempts to alleviate, Denies blurred vision, and epigastric. Currently rates pain 6/10. Patient recently started on Hypertension medication. Noticed Headaches more frequently after starting medication.   Rash- developed inpatient and was treated with benadryl inpatient. Patient continuing to take benadryl at home, but noted "it keeps coming back". No active rash today in MAU.   Blistering on wound vac- onset Yesterday, not painful.   Hypertension Associated symptoms include headaches. Pertinent negatives include no chest pain, palpitations or shortness of breath.  Wound Check  Headache  Pertinent negatives include no abdominal pain, back pain, eye pain, fever, nausea, seizures, vomiting or weakness. Her past medical history is significant for hypertension.    OB History     Gravida  3   Para  2   Term  2   Preterm      AB  1   Living  3      SAB  1   IAB      Ectopic      Multiple  1   Live Births  3           Past Medical History:  Diagnosis Date   Anxiety    Asthma    Dysmenorrhea    Endometriosis    Heart murmur    Pregnancy induced hypertension    Ulcerative colitis ([place])     Past Surgical History:  Procedure Laterality Date   BIOPSY BOWEL     CESAREAN SECTION MULTI-GESTATIONAL N/A 10/28/2021   Procedure: CESAREAN SECTION MULTI-GESTATIONAL;  Surgeon: Sloan Leiter, MD;  Location: [facility] LD ORS;  Service: Obstetrics;  Laterality: N/A;   COLON BIOPSY     hx of colon problems.  ? mega colon, awaiting bx results   NO PAST SURGERIES      Family History  Problem  Relation Age of Onset   Healthy Mother    Healthy Father    GI problems Brother    Diabetes Maternal Grandmother    Hypertension Maternal Grandmother    Heart disease Maternal Grandmother    Breast cancer Maternal Grandmother        unsure age of onset   Diabetes Paternal Grandmother    Heart disease Paternal Grandmother    Hypertension Paternal Grandmother    Diabetes Paternal Grandfather    Heart disease Paternal Grandfather    Hypertension Paternal Grandfather     Social History   Tobacco Use   Smoking status: Never   Smokeless tobacco: Never  Vaping Use   Vaping Use: Never used  Substance Use Topics   Alcohol use: No   Drug use: No    Allergies:  Allergies  Allergen Reactions   Aspirin Other (See Comments)     Stomach bleeding    Fish Allergy Diarrhea, Nausea And Vomiting, Swelling and Other (See Comments)    Stomach pain.   Naproxen Other (See Comments)    stomach bleeding    Nsaids Other (See Comments)    GI BLEED   Shellfish Allergy Diarrhea, Nausea And Vomiting, Swelling and Rash  Medications Prior to Admission  Medication Sig Dispense Refill Last Dose   acetaminophen (TYLENOL) 500 MG tablet Take 500-1,000 mg by mouth every 6 (six) hours as needed (pain.).   11/01/2021   amLODipine (NORVASC) 5 MG tablet Take 1 tablet (5 mg total) by mouth daily for 30 doses. 30 tablet 0 11/01/2021   FLUoxetine (PROZAC) 20 MG capsule Take 1 capsule (20 mg total) by mouth daily. 30 capsule 3 11/01/2021   furosemide (LASIX) 20 MG tablet Take 1 tablet (20 mg total) by mouth daily for 5 doses. 5 tablet 0 11/01/2021   oxyCODONE 10 MG TABS Take 0.5 tablets (5 mg total) by mouth every 6 (six) hours as needed for severe pain. 15 tablet 0 11/01/2021   polyethylene glycol (MIRALAX) 17 g packet Take 17 g by mouth daily. 14 each 0 11/01/2021   calcium carbonate (TUMS - DOSED IN MG ELEMENTAL CALCIUM) 500 MG chewable tablet Chew 1-2 tablets by mouth 3 (three) times daily as needed for  indigestion or heartburn.      famotidine (PEPCID) 20 MG tablet Take 1 tablet (20 mg total) by mouth 2 (two) times daily. (Patient taking differently: Take 20 mg by mouth 2 (two) times daily as needed for heartburn or indigestion.) 30 tablet 4    norethindrone (ORTHO MICRONOR) 0.35 MG tablet Take 1 tablet (0.35 mg total) by mouth daily. 28 tablet 11     Review of Systems  Constitutional:  Negative for chills, fatigue and fever.  Eyes:  Negative for pain and visual disturbance.  Respiratory:  Negative for apnea, shortness of breath and wheezing.   Cardiovascular:  Negative for chest pain and palpitations.  Gastrointestinal:  Negative for abdominal pain, constipation, diarrhea, nausea and vomiting.  Genitourinary:  Negative for difficulty urinating, dysuria, pelvic pain, vaginal bleeding, vaginal discharge and vaginal pain.  Musculoskeletal:  Negative for back pain.  Neurological:  Positive for headaches. Negative for seizures and weakness.  Psychiatric/Behavioral:  Negative for suicidal ideas.    Physical Exam   Blood pressure 115/61, pulse 85, temperature 98.1 F (36.7 C), resp. rate 18, height 5' 5"  (1.651 m), weight 115.2 kg, SpO2 99 %, unknown if currently breastfeeding.  Physical Exam Vitals and nursing note reviewed.  Constitutional:      Appearance: She is well-developed. She is not ill-appearing.  HENT:     Head: Normocephalic.  Cardiovascular:     Rate and Rhythm: Normal rate and regular rhythm.  Pulmonary:     Effort: Pulmonary effort is normal.     Breath sounds: Normal breath sounds.  Abdominal:     Palpations: Abdomen is soft.  Musculoskeletal:        General: Normal range of motion.  Skin:    General: Skin is warm and dry.     Capillary Refill: Capillary refill takes less than 2 seconds.     Findings: No rash.     Comments: Blisters noted on maternal left at edge of adhesive. Non-tender to touch   Neurological:     Mental Status: She is alert.     GCS: GCS eye  subscore is 4. GCS verbal subscore is 5. GCS motor subscore is 6.     Deep Tendon Reflexes: Reflexes normal.  Psychiatric:        Mood and Affect: Mood normal.        Behavior: Behavior normal.   Patient Vitals for the past 24 hrs:  BP Temp Pulse Resp SpO2 Height Weight  11/01/21 1300 115/61 -- 85 --  99 % -- --  11/01/21 1245 117/70 -- 79 -- 99 % -- --  11/01/21 1230 118/68 -- 82 -- 98 % -- --  11/01/21 1215 118/71 -- 78 -- 99 % -- --  11/01/21 1033 125/89 98.1 F (36.7 C) 89 18 -- 5' 5"  (1.651 m) 115.2 kg    MAU Course  Procedures Orders Placed This Encounter  Procedures   CBC   Comprehensive metabolic panel   Meds ordered this encounter  Medications   acetaminophen-caffeine (EXCEDRIN TENSION HEADACHE) 500-65 MG per tablet 2 tablet   loratadine (CLARITIN) tablet 10 mg   Results for orders placed or performed during the hospital encounter of 11/01/21 (from the past 24 hour(s))  CBC     Status: Abnormal   Collection Time: 11/01/21  1:15 PM  Result Value Ref Range   WBC 11.0 (H) 4.0 - 10.5 K/uL   RBC 3.13 (L) 3.87 - 5.11 MIL/uL   Hemoglobin 10.1 (L) 12.0 - 15.0 g/dL   HCT 28.8 (L) 36.0 - 46.0 %   MCV 92.0 80.0 - 100.0 fL   MCH 32.3 26.0 - 34.0 pg   MCHC 35.1 30.0 - 36.0 g/dL   RDW 16.3 (H) 11.5 - 15.5 %   Platelets 300 150 - 400 K/uL   nRBC 0.0 0.0 - 0.2 %  Comprehensive metabolic panel     Status: Abnormal   Collection Time: 11/01/21  1:15 PM  Result Value Ref Range   Sodium 139 135 - 145 mmol/L   Potassium 4.2 3.5 - 5.1 mmol/L   Chloride 109 98 - 111 mmol/L   CO2 23 22 - 32 mmol/L   Glucose, Bld 78 70 - 99 mg/dL   BUN 6 6 - 20 mg/dL   Creatinine, Ser 0.57 0.44 - 1.00 mg/dL   Calcium 8.7 (L) 8.9 - 10.3 mg/dL   Total Protein 6.3 (L) 6.5 - 8.1 g/dL   Albumin 2.6 (L) 3.5 - 5.0 g/dL   AST 44 (H) 15 - 41 U/L   ALT 33 0 - 44 U/L   Alkaline Phosphatase 93 38 - 126 U/L   Total Bilirubin 0.4 0.3 - 1.2 mg/dL   GFR, Estimated >60 >60 mL/min   Anion gap 7 5 - 15     MDM Lab results reviewed and interpreted by me.    - BPs normotensive in MAU.  - Headache improved with PO Excedrin.  - Low suspicion for PP PreE.  - Patient counseled that Norvasc can cause headaches in some patient.s  - Wound Vac removed in triage by S. Catalina Gravel and Sunday Corn, CNM. Area where wound vac was present is redended and irritated. Discussed high likelihood of allergic reaction to benzoine or adhesive from wound vac.  - Incision clean, dry and tissues approximated well. No drainage  - Consulted Dr. Nehemiah Settle on further abx treatment based on clinical picture and patient habitus. Per MD, prescribe topical Bactroban for blisters do decrease infection.  - Non-drowsy anti-histamine given to decrease effects of reaction.   Assessment and Plan   1. Postpartum headache   2. Allergic dermatitis   3. Allergic contact dermatitis due to adhesives    - Discussed that patient is likely allergic to either the adhesive or the benzoin used to placed adhesive. Informed patient to monitor blistering sites for signs of infection.  - Rx for Bactroban sent to outpatient pharmacy. Admin instructions provided at patient bedside.  - Patient may also take OTC benadryl for itching from rash.   -  Instructions on incision care provided at patient bedside. Patient has a incision check this week. Encouraged to keep appointment. - Return and worsening precautions reviewed.  - Patient discharged home in stable condition and may return to MAU as needed.   Jacquiline Doe, MSN CNM  11/01/2021, 1:04 PM

## 2021-11-04 ENCOUNTER — Encounter: Payer: Medicaid Other | Admitting: Advanced Practice Midwife

## 2021-11-04 ENCOUNTER — Other Ambulatory Visit: Payer: Medicaid Other

## 2021-11-07 ENCOUNTER — Other Ambulatory Visit: Payer: Self-pay | Admitting: Family Medicine

## 2021-11-08 ENCOUNTER — Other Ambulatory Visit: Payer: Medicaid Other

## 2021-11-08 ENCOUNTER — Encounter: Payer: Medicaid Other | Admitting: Obstetrics & Gynecology

## 2021-11-08 ENCOUNTER — Ambulatory Visit (INDEPENDENT_AMBULATORY_CARE_PROVIDER_SITE_OTHER): Payer: Medicaid Other | Admitting: Women's Health

## 2021-11-08 ENCOUNTER — Encounter: Payer: Self-pay | Admitting: Women's Health

## 2021-11-08 VITALS — BP 112/75 | HR 75 | Ht 65.0 in | Wt 237.2 lb

## 2021-11-08 DIAGNOSIS — O9089 Other complications of the puerperium, not elsewhere classified: Secondary | ICD-10-CM

## 2021-11-08 DIAGNOSIS — R519 Headache, unspecified: Secondary | ICD-10-CM

## 2021-11-08 DIAGNOSIS — Z4889 Encounter for other specified surgical aftercare: Secondary | ICD-10-CM

## 2021-11-08 DIAGNOSIS — R52 Pain, unspecified: Secondary | ICD-10-CM

## 2021-11-08 NOTE — Progress Notes (Signed)
   GYN VISIT Patient name: Kathleen Hamilton MRN [id number]  Date of birth: 1995-08-16 Chief Complaint:   Routine Post Op  History of Present Illness:   Kathleen Hamilton is a 26 y.o. [phone] female 62d s/p PCS for twins being seen today for incision check. Daily headaches, wakes up with them sometimes. No appetite, not sleeping well. Drinks mainly water and body armour. Takes apap 1gm w/ headaches and helps. Denies PPD/anxiety.  Body aches. No fever/chills, redness/hardness of breasts. Breast & bottlefeeding the babies.  No LMP recorded.     05/06/2021   10:58 AM 07/22/2020   10:41 AM  Depression screen PHQ 2/9  Decreased Interest 1 0  Down, Depressed, Hopeless 0 0  PHQ - 2 Score 1 0  Altered sleeping 1   Tired, decreased energy 1   Change in appetite 0   Feeling bad or failure about yourself  0   Trouble concentrating 0   Moving slowly or fidgety/restless 0   Suicidal thoughts 0   PHQ-9 Score 3         05/06/2021   10:58 AM  GAD 7 : Generalized Anxiety Score  Nervous, Anxious, on Edge 0  Control/stop worrying 0  Worry too much - different things 0  Trouble relaxing 0  Restless 0  Easily annoyed or irritable 0  Afraid - awful might happen 0  Total GAD 7 Score 0     Review of Systems:   Pertinent items are noted in HPI Denies fever/chills, dizziness, headaches, visual disturbances, fatigue, shortness of breath, chest pain, abdominal pain, vomiting, abnormal vaginal discharge/itching/odor/irritation, problems with periods, bowel movements, urination, or intercourse unless otherwise stated above.  Pertinent History Reviewed:  Reviewed past medical,surgical, social, obstetrical and family history.  Reviewed problem list, medications and allergies. Physical Assessment:   Vitals:   11/08/21 1538  BP: 112/75  Pulse: 75  Weight: 237 lb 4 oz (107.6 kg)  Height: 5' 5"  (1.651 m)  Body mass index is 39.48 kg/m.       Physical Examination:   General appearance: alert, well  appearing, and in no distress  Mental status: alert, oriented to person, place, and time  Skin: warm & dry   Cardiovascular: normal heart rate noted  Respiratory: normal respiratory effort, no distress  Abdomen: soft, non-tender, c/s healing well, areas of wet erythema Rt side c/w candida- painted w/ gentian violet  Pelvic: examination not indicated  Extremities: no edema   Chaperone: N/A    No results found for this or any previous visit (from the past 24 hour(s)).  Assessment & Plan:  1) 11d s/p PCS for twins> breast & bottlefeeding  2) Incision check> healing well, painted w/ gentian violet, keep clean & dry  3) Daily headaches> bp great, wakes up w/ them so spinal headache unlikely. Discussed adequate hydration/nutrition/rest, gave printed prevention/relief measures   4) Body aches> no fever/chills or breast sx, let us know if any of these develop  Meds: No orders of the defined types were placed in this encounter.   No orders of the defined types were placed in this encounter.   Return for As scheduled.  [place], [facility] 11/08/2021 4:17 PM

## 2021-11-08 NOTE — Patient Instructions (Signed)
For Headaches:  Stay well hydrated, drink enough water so that your urine is clear, sometimes if you are dehydrated you can get headaches Eat small frequent meals and snacks, sometimes if you are hungry you can get headaches Sometimes you get headaches during pregnancy from the pregnancy hormones You can try tylenol (1-2 regular strength 390m or 1-2 extra strength 5066m as directed on the box. The least amount of medication that works is best.  Cool compresses (cool wet washcloth or ice pack) to area of head that is hurting You can also try drinking a caffeinated drink to see if this will help If not helping, try below:  For Prevention of Headaches/Migraines: CoQ10 10032mhree times daily Vitamin B2 400m41mily Magnesium Oxide 400-600mg17mly  Foods to alleviate migraines:  1) dark leafy greens 2) avocado 3) tuna 4) salmon  5) beans and legumes  Foods to avoid: 1) Excessive (or irregular timing) coffee 3) aged cheeses 4) chocolate 5) citrus fruits 6) aspartame and other artifical sweeteners 7) yeast 8) MSG (in processed foods) 9) processed and cured meats 10) nuts and certain seeds 11) chicken livers and other organ meats 12) dairy products like buttermilk, sour cream, and yogurt 13) dried fruits like dates, figs, and raisins 14) garlic 15) onions 16) potato chips 17) pickled foods like olives and sauerkraut 18) some fresh fruits like ripe banana, papaya, red plums, raspberries, kiwi, pineapple 19) tomato-based products  Recommend to keep a migraine diary: rate daily the severity of your headache (1-10) and what foods you eat that day to help determine patterns.   If You Get a Bad Headache/Migraine: Benadryl 25mg 28mnesium Oxide 1 large Gatorade 2 extra strength Tylenol (1,000mg t40m) 1 cup coffee or Coke      If this doesn't help please call us @ [phone]

## 2021-11-11 ENCOUNTER — Other Ambulatory Visit: Payer: Medicaid Other

## 2021-11-11 ENCOUNTER — Inpatient Hospital Stay (HOSPITAL_COMMUNITY): Admit: 2021-11-11 | Payer: Self-pay

## 2021-11-11 ENCOUNTER — Encounter: Payer: Self-pay | Admitting: Obstetrics & Gynecology

## 2021-11-12 ENCOUNTER — Inpatient Hospital Stay (HOSPITAL_COMMUNITY)
Admission: AD | Admit: 2021-11-12 | Discharge: 2021-11-12 | Disposition: A | Discharge status: Home / Self Care | Payer: Medicaid Other | Attending: Obstetrics & Gynecology | Admitting: Obstetrics & Gynecology

## 2021-11-12 ENCOUNTER — Other Ambulatory Visit: Payer: Self-pay

## 2021-11-12 DIAGNOSIS — O909 Complication of the puerperium, unspecified: Secondary | ICD-10-CM | POA: Insufficient documentation

## 2021-11-12 DIAGNOSIS — B372 Candidiasis of skin and nail: Secondary | ICD-10-CM | POA: Insufficient documentation

## 2021-11-12 MED ORDER — NYSTATIN 100000 UNIT/GM EX CREA: TOPICAL_CREAM | CUTANEOUS | 0 refills | Status: DC

## 2021-11-12 NOTE — MAU Provider Note (Signed)
History     CSN: [id number]  Arrival date and time: 11/12/21 1705   Event Date/Time   First Provider Initiated Contact with Patient 11/12/21 1759      Chief Complaint  Patient presents with   Incision Check   HPI Patient Kathleen Hamilton is 26 y.o. G3P2013  At 11 days s/p rLTCS. She has had a pp course that has been uneventful; has been seen in the office with complaints of pain at her incision site. The incision site was painted with gentian violet on 11/08/2021. She noticed yesterday (Friday) that her incision was hurting her more and her partner thought it looked opened. She came to MAU in order to get it checked out.  OB History     Gravida  3   Para  2   Term  2   Preterm      AB  1   Living  3      SAB  1   IAB      Ectopic      Multiple  1   Live Births  3           Past Medical History:  Diagnosis Date   Anxiety    Asthma    Dysmenorrhea    Endometriosis    Heart murmur    Pregnancy induced hypertension    Ulcerative colitis ([place])     Past Surgical History:  Procedure Laterality Date   BIOPSY BOWEL     CESAREAN SECTION MULTI-GESTATIONAL N/A 10/28/2021   Procedure: CESAREAN SECTION MULTI-GESTATIONAL;  Surgeon: Sloan Leiter, MD;  Location: [facility] LD ORS;  Service: Obstetrics;  Laterality: N/A;   COLON BIOPSY     hx of colon problems.  ? mega colon, awaiting bx results   NO PAST SURGERIES      Family History  Problem Relation Age of Onset   Healthy Mother    Healthy Father    GI problems Brother    Diabetes Maternal Grandmother    Hypertension Maternal Grandmother    Heart disease Maternal Grandmother    Breast cancer Maternal Grandmother        unsure age of onset   Diabetes Paternal Grandmother    Heart disease Paternal Grandmother    Hypertension Paternal Grandmother    Diabetes Paternal Grandfather    Heart disease Paternal Grandfather    Hypertension Paternal Grandfather     Social History   Tobacco Use   Smoking  status: Never   Smokeless tobacco: Never  Vaping Use   Vaping Use: Never used  Substance Use Topics   Alcohol use: No   Drug use: No    Allergies:  Allergies  Allergen Reactions   Aspirin Other (See Comments)     Stomach bleeding    Fish Allergy Diarrhea, Nausea And Vomiting, Swelling and Other (See Comments)    Stomach pain.   Naproxen Other (See Comments)    stomach bleeding    Nsaids Other (See Comments)    GI BLEED   Benzoin Rash   Shellfish Allergy Diarrhea, Nausea And Vomiting, Swelling and Rash    No medications prior to admission.    Review of Systems  Constitutional: Negative.   HENT: Negative.    Respiratory: Negative.    Cardiovascular: Negative.   Gastrointestinal: Negative.   Genitourinary: Negative.   Neurological: Negative.   Hematological: Negative.   Psychiatric/Behavioral: Negative.     Physical Exam   Blood pressure 125/85, pulse 80, temperature 98.4 F (  36.9 C), temperature source Oral, resp. rate 19, height 5' 5"  (1.651 m), weight 107.2 kg, SpO2 98 %, currently breastfeeding.  Physical Exam Constitutional:      Appearance: Normal appearance.  Cardiovascular:     Rate and Rhythm: Normal rate.  Pulmonary:     Effort: Pulmonary effort is normal.  Abdominal:     General: Abdomen is flat. There is no distension.     Palpations: There is no mass.     Tenderness: There is no abdominal tenderness. There is no guarding.     Comments: C/section scar is well-approximated; no swelling, erythema or tenderness. Gentian violet visible across lower abdomen. On right side, below scar, is a reddened area the size of a silver dollar.   Neurological:     Mental Status: She is alert.     MAU Course  Procedures  MDM -TC to Dr. Elonda Husky, he recommends repeat gentian violet next week.   Assessment and Plan   1. Skin yeast infection    -patient given RX for nystatin and also recommended OTC antifungal cream. Recommend keeping area dry and drying off with  hair dryer after showering.  -she will call the office on Monday for in person visit  [place] 11/12/2021, 6:43 PM

## 2021-11-12 NOTE — MAU Note (Signed)
Kathleen Hamilton is a 26 y.o. at Unknown here in MAU reporting: incision is sore on right side.  Reports incision site is red, raised  and has "raw area" around it.  States increased soreness today.  Denies drainage or bleeding. S/P Cesarean Section 10/28/2021 LMP: N/A Onset of complaint: today Pain score: 6 Vitals:   11/12/21 1742  BP: 125/85  Pulse: 80  Resp: 19  Temp: 98.4 F (36.9 C)  SpO2: 98%     FHT:N/A Lab orders placed from triage:   None

## 2021-11-15 ENCOUNTER — Telehealth: Payer: Self-pay

## 2021-11-15 NOTE — Telephone Encounter (Signed)
Jana Half (Equities trader) from [employer] called and stated that patient gave birth on 10/28/2021. Had visit today and patient's Edinburgh score was 10. Patient has no thoughts of hurting herself or the baby. Due to follow up at our office on 12/08/2021.  I called the patient and she stated that she feels fine. She has no thoughts of harming herself or the baby. Her PCP started her on Sertraline 25 mg daily. She just started taking it on Saturday. She wants to give it sometime to work first but if her symptoms worsen, she will call us back to come in sooner.

## 2021-11-16 ENCOUNTER — Encounter: Payer: Self-pay | Admitting: Obstetrics & Gynecology

## 2021-11-16 ENCOUNTER — Ambulatory Visit (INDEPENDENT_AMBULATORY_CARE_PROVIDER_SITE_OTHER): Payer: Medicaid Other | Admitting: Obstetrics & Gynecology

## 2021-11-16 VITALS — BP 106/70 | HR 84 | Ht 65.0 in

## 2021-11-16 DIAGNOSIS — B372 Candidiasis of skin and nail: Secondary | ICD-10-CM

## 2021-11-16 NOTE — Progress Notes (Signed)
  HPI: Patient returns for routine postoperative follow-up having undergone C section on 10/18/21.  The patient's immediate postoperative recovery has been unremarkable. Since hospital discharge the patient reports has post c section candidiasis.   Current Outpatient Medications: nystatin cream (MYCOSTATIN), Apply to affected area 2 times daily, Disp: 30 g, Rfl: 0 sertraline (ZOLOFT) 25 MG tablet, Take 25 mg by mouth daily., Disp: , Rfl:  acetaminophen (TYLENOL) 500 MG tablet, Take 500-1,000 mg by mouth every 6 (six) hours as needed (pain.)., Disp: , Rfl:  norethindrone (ORTHO MICRONOR) 0.35 MG tablet, Take 1 tablet (0.35 mg total) by mouth daily. (Patient not taking: Reported on 11/16/2021), Disp: 28 tablet, Rfl: 11  No current facility-administered medications for this visit.    Blood pressure 106/70, pulse 84, height 5' 5"  (1.651 m), currently breastfeeding.  Physical Exam: Improving GV placed but not bad at all now  Diagnostic Tests:   Pathology:   Impression + Management plan:   ICD-10-CM   1. Skin candidiasis, post op, responding to GV  B37.2          Medications Prescribed this encounter: No orders of the defined types were placed in this encounter.     Follow up: No follow-ups on file.    Florian Buff, MD Attending Physician for the [facility] [place] 11/16/2021 11:52 AM

## 2021-11-26 ENCOUNTER — Other Ambulatory Visit: Payer: Self-pay | Admitting: Family Medicine

## 2021-12-02 ENCOUNTER — Ambulatory Visit: Payer: Medicaid Other | Admitting: Advanced Practice Midwife

## 2021-12-08 ENCOUNTER — Encounter: Payer: Self-pay | Admitting: Obstetrics & Gynecology

## 2021-12-08 ENCOUNTER — Other Ambulatory Visit (HOSPITAL_COMMUNITY)
Admission: RE | Admit: 2021-12-08 | Discharge: 2021-12-08 | Disposition: A | Discharge status: Home / Self Care | Payer: Medicaid Other | Source: Ambulatory Visit | Attending: Advanced Practice Midwife | Admitting: Advanced Practice Midwife

## 2021-12-08 ENCOUNTER — Ambulatory Visit (INDEPENDENT_AMBULATORY_CARE_PROVIDER_SITE_OTHER): Payer: Medicaid Other | Admitting: Obstetrics & Gynecology

## 2021-12-08 DIAGNOSIS — Z30011 Encounter for initial prescription of contraceptive pills: Secondary | ICD-10-CM

## 2021-12-08 DIAGNOSIS — Z01419 Encounter for gynecological examination (general) (routine) without abnormal findings: Secondary | ICD-10-CM

## 2021-12-08 MED ORDER — NORETHINDRONE 0.35 MG PO TABS: 1.0000 | ORAL_TABLET | Freq: Every day | ORAL | 4 refills | Status: DC

## 2021-12-08 NOTE — Progress Notes (Signed)
POSTPARTUM VISIT Patient name: Kathleen Hamilton MRN [id number]  Date of birth: 06/05/1995 Chief Complaint:   Postpartum Care  History of Present Illness:   Kathleen Hamilton is a 26 y.o. [phone] female being seen today for a postpartum visit. She is 6 weeks postpartum following a primary cesarean section, low transverse incision at 53 gestational weeks with di/di twins  Pregnancy uncomplicated.  Last pap smear: 2019    Postpartum course has been uncomplicated.  Bleeding no bleeding and has already had her period . Bowel function is normal. Bladder function is normal. Urinary incontinence? No, fecal incontinence? No Patient is not sexually active. Last sexual activity: prior to delivery.   Desired contraception: POPs. Patient does not know if she wants a pregnancy in the future.    The pregnancy intention screening data noted above was reviewed. Potential methods of contraception were discussed. The patient elected to proceed with No data recorded.  Edinburgh Postpartum Depression Screening: Positive    Edinburgh Postnatal Depression Scale - 12/08/21 1341       Edinburgh Postnatal Depression Scale:  In the Past 7 Days   I have been able to laugh and see the funny side of things. 1    I have looked forward with enjoyment to things. 1    I have blamed myself unnecessarily when things went wrong. 2    I have been anxious or worried for no good reason. 2    I have felt scared or panicky for no good reason. 2    Things have been getting on top of me. 2    I have been so unhappy that I have had difficulty sleeping. 1    I have felt sad or miserable. 2    I have been so unhappy that I have been crying. 2    The thought of harming myself has occurred to me. 0    Edinburgh Postnatal Depression Scale Total 15             Baby's course has been uncomplicated. Baby is feeding by breast and bottle: milk supply adequate. Infant has a pediatrician/family doctor? Yes.  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  Pt has material needs met for her and baby: Yes.    Review of Systems:   Pertinent items are noted in HPI Denies Abnormal vaginal discharge w/ itching/odor/irritation, headaches, visual changes, shortness of breath, chest pain, abdominal pain, severe nausea/vomiting, or problems with urination or bowel movements. Pertinent History Reviewed:  Reviewed past medical,surgical, obstetrical and family history.  Reviewed problem list, medications and allergies. OB History  Gravida Para Term Preterm AB Living  _0 SAB IAB Ectopic Multiple Live Births  _1 # Outcome Date GA Lbr Len/2nd Weight Sex Delivery Anes PTL Lv  3 Term 07/11/18 [id number]:55 / 00:32 5 lb 0.8 oz (2.29 kg) F Vag-Vacuum EPI, Local N LIV     Complications: Small for gestational age  2A Term      CS-Unspec   LIV  2B Term      CS-Unspec   LIV  1 SAB            Physical Assessment:   Vitals:   12/08/21 1339  BP: 101/64  Pulse: 73  Weight: 240 lb 6 oz (109 kg)  Height: 5' 5" (1.651 m)  Body mass index is 40 kg/m.   POSTPARTUM VISIT Patient name: Kathleen Hamilton MRN [id number]  Date of birth: 01/26/1996 Chief Complaint:   Postpartum Care  History of Present Illness:   Kathleen Hamilton is a 26 y.o. G3P2013 female being seen today for a postpartum visit. She is 6 weeks postpartum following a primary cesarean section, low transverse incision at 38 gestational weeks with di/di twins  Pregnancy uncomplicated.  Last pap smear: 2019    Postpartum course has been uncomplicated.  Bleeding no bleeding and has already had her period . Bowel function is normal. Bladder function is normal. Urinary incontinence? No, fecal incontinence? No Patient is not sexually active. Last sexual activity: prior to delivery.   Desired contraception: POPs. Patient does not know if she wants a pregnancy in the future.    The pregnancy intention screening data noted above was reviewed. Potential methods of contraception were discussed. The patient elected to proceed with No data recorded.  Edinburgh Postpartum Depression Screening: Positive    Edinburgh Postnatal Depression Scale - 12/08/21 1341       Edinburgh Postnatal Depression Scale:  In the Past 7 Days   I have been able to laugh and see the funny side of things. 1    I have looked forward with enjoyment to things. 1    I have blamed myself unnecessarily when things went wrong. 2    I have been anxious or worried for no good reason. 2    I have felt scared or panicky for no good reason. 2    Things have been getting on top of me. 2    I have been so unhappy that I have had difficulty sleeping. 1    I have felt sad or miserable. 2    I have been so unhappy that I have been crying. 2    The thought of harming myself has occurred to me. 0    Edinburgh Postnatal Depression Scale Total 15             Baby's course has been uncomplicated. Baby is feeding by breast and bottle: milk supply adequate. Infant has a pediatrician/family doctor? Yes.  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  Pt has material needs met for her and baby: Yes.    Review of Systems:   Pertinent items are noted in HPI Denies Abnormal vaginal discharge w/ itching/odor/irritation, headaches, visual changes, shortness of breath, chest pain, abdominal pain, severe nausea/vomiting, or problems with urination or bowel movements. Pertinent History Reviewed:  Reviewed past medical,surgical, obstetrical and family history.  Reviewed problem list, medications and allergies. OB History  Gravida Para Term Preterm AB Living  3 2 2   1 3  SAB IAB Ectopic Multiple Live Births  1     1 3    # Outcome Date GA Lbr Len/2nd Weight Sex Delivery Anes PTL Lv  3 Term 07/11/18 [redacted]w[redacted]d 13:55 / 00:32 5 lb 0.8 oz (2.29 kg) F Vag-Vacuum EPI, Local N LIV     Complications: Small for gestational age  2A Term      CS-Unspec   LIV  2B Term      CS-Unspec   LIV  1 SAB            Physical Assessment:   Vitals:   12/08/21 1339  BP: 101/64  Pulse: 73  Weight: 240 lb 6 oz (109 kg)  Height: 5' 5" (1.651 m)  Body mass index is 40 kg/m.  

## 2021-12-10 LAB — CYTOLOGY - PAP
Comment: NEGATIVE
Diagnosis: NEGATIVE
High risk HPV: NEGATIVE

## 2022-01-20 ENCOUNTER — Encounter: Payer: Self-pay | Admitting: *Deleted

## 2022-04-01 ENCOUNTER — Encounter: Payer: Self-pay | Admitting: Internal Medicine

## 2022-04-01 ENCOUNTER — Ambulatory Visit (INDEPENDENT_AMBULATORY_CARE_PROVIDER_SITE_OTHER): Payer: BLUE CROSS/BLUE SHIELD | Admitting: Internal Medicine

## 2022-04-01 ENCOUNTER — Other Ambulatory Visit: Payer: Self-pay

## 2022-04-01 VITALS — BP 128/78 | HR 72 | Temp 97.6°F | Resp 16 | Ht 65.0 in | Wt 250.3 lb

## 2022-04-01 DIAGNOSIS — T7802XA Anaphylactic reaction due to shellfish (crustaceans), initial encounter: Secondary | ICD-10-CM

## 2022-04-01 DIAGNOSIS — T7803XA Anaphylactic reaction due to other fish, initial encounter: Secondary | ICD-10-CM

## 2022-04-01 DIAGNOSIS — J3089 Other allergic rhinitis: Secondary | ICD-10-CM | POA: Diagnosis not present

## 2022-04-01 DIAGNOSIS — L501 Idiopathic urticaria: Secondary | ICD-10-CM

## 2022-04-01 MED ORDER — EPINEPHRINE 0.3 MG/0.3ML IJ SOAJ: 0.3000 mg | INTRAMUSCULAR | 1 refills | Status: DC | PRN

## 2022-04-01 MED ORDER — AZELASTINE HCL 0.1 % NA SOLN: 1.0000 | Freq: Two times a day (BID) | NASAL | 5 refills | Status: DC | PRN

## 2022-04-01 MED ORDER — FAMOTIDINE 20 MG PO TABS: 20.0000 mg | ORAL_TABLET | Freq: Two times a day (BID) | ORAL | 5 refills | Status: DC | PRN

## 2022-04-01 MED ORDER — FEXOFENADINE HCL 180 MG PO TABS: 180.0000 mg | ORAL_TABLET | Freq: Two times a day (BID) | ORAL | 5 refills | Status: DC | PRN

## 2022-04-01 NOTE — Patient Instructions (Addendum)
Idiopathic Urticaria (Hives): - At this time etiology of hives and swelling is unknown. Hives can be caused by a variety of different triggers including illness/infection, exercise, pressure, vibrations, extremes of temperature to name a few however majority of the time there is no identifiable trigger.  -If persistent, we will obtain labs to rule out inflammatory/autoimmune/alpha gal/mast cell causes. -Start Allegra 159m daily.   -If no improvement in 3-4 days, increase to Allegra 1855mtwice daily.   -If no improvement in 3-4 days, add Pepcid 2030mwice daily and continue Allegra 180m89mice daily. -If still no improvement, increase to Allegra 360mg36mce daily and Pepcid 40mg 22me daily.  Food allergy:  - Initial sxs: when young, developed hives and vomiting 2-3 hours after fish and shellfish ingestion.  - today's skin testing 03/2022 was negative for fish and shellfish. We will obtain sIgE to fish and shellfish to confirm.  If both are negative, can consider oral food challenge.  - please strictly avoid fish and shellfish.  - for SKIN only reaction, okay to take Benadryl 25mg c39mles every 6 hours - for SKIN + ANY additional symptoms, OR IF concern for LIFE THREATENING reaction = Epipen Autoinjector EpiPen 0.3 mg. - If using Epinephrine autoinjector, call 911 or go the ER.    Allergic Rhinitis: - Positive skin test 03/2022: dust mite - Avoidance measures discussed. - Use nasal saline rinses before nose sprays such as with Neilmed Sinus Rinse.  Use distilled water.   - Use Azelastine 1-2 sprays each nostril twice daily as needed. Aim upward and outward. - Use Allegra 180mg da42mas needed for runny nose, sneezing, itchy watery eyes.  - Consider allergy shots as long term control of your symptoms by teaching your immune system to be more tolerant of your allergy triggers  ALLERGEN AVOIDANCE MEASURES Dust Mites Use central air conditioning and heat; and change the filter monthly.  Pleated  filters work better than mesh filters.  Electrostatic filters may also be used; wash the filter monthly.  Window air conditioners may be used, but do not clean the air as well as a central air conditioner.  Change or wash the filter monthly. Keep windows closed.  Do not use attic fans.   Encase the mattress, box springs and pillows with zippered, dust proof covers. Wash the bed linens in hot water weekly.   Remove carpet, especially from the bedroom. Remove stuffed animals, throw pillows, dust ruffles, heavy drapes and other items that collect dust from the bedroom. Do not use a humidifier.   Use wood, vinyl or leather furniture instead of cloth furniture in the bedroom. Keep the indoor humidity at 30 - 40%.  Monitor with a humidity gauge.

## 2022-04-01 NOTE — Progress Notes (Unsigned)
NEW PATIENT  Date of Service/Encounter:  04/04/22  Consult requested by: Caren Macadam, MD   Subjective:   Kathleen Hamilton (DOB: 17-Oct-1995) is a 27 y.o. female who presents to the clinic on 04/01/2022 with a chief complaint of Allergy Testing (Environmental: Fine/Food: Fig; Shellfish) .    History obtained from: chart review and patient.   Hives: Reports onset of rash after giving birth and started about 1 day later.  Rash is itchy, raised and red and sometimes burns similar to hives.  She has pictures that look like hives.  She takes Benadryl which helps reduce the swelling of the hives but still has itching.  Happening almost daily.  Her PCP and obgyn told her maybe it was post partum but now that it has persisted for 5 months, asked her to see an Allergist.  No scarring or pain with the rash.  Taking Xyzal daily but still having daily break outs.  Last use of Xyzal was a week ago.  No clear triggers.   Rhinitis:  Started since she was young.  Symptoms include: nasal congestion, rhinorrhea, post nasal drainage, and sneezing  Occurs seasonally-Fall Potential triggers: not sure Treatments tried:  Xyzal PRN  Previous allergy testing: no History of reflux/heartburn: no History of sinus surgery: no Nonallergic triggers: none   Concern for Food Allergy:  Foods of concern: fish and shellfish History of reaction: when she was young, she had hives and vomiting about 2-3 hours later.  Told to avoid it as she had to be taken to the hospital.  There was one time she had crab and did not have symptoms.   Previous allergy testing no Carries an epinephrine autoinjector: no  Past Medical History: Past Medical History:  Diagnosis Date   Anxiety    Dysmenorrhea    Endometriosis    Heart murmur    Pregnancy induced hypertension    Ulcerative colitis ([place])      Past Surgical History: Past Surgical History:  Procedure Laterality Date   BIOPSY BOWEL     CESAREAN SECTION  MULTI-GESTATIONAL N/A 10/28/2021   Procedure: CESAREAN SECTION MULTI-GESTATIONAL;  Surgeon: Sloan Leiter, MD;  Location: [facility];  Service: Obstetrics;  Laterality: N/A;   COLON BIOPSY     hx of colon problems.  ? mega colon, awaiting bx results   NO PAST SURGERIES      Family History: Family History  Problem Relation Age of Onset   Healthy Mother    Healthy Father    GI problems Brother    Diabetes Maternal Grandmother    Hypertension Maternal Grandmother    Heart disease Maternal Grandmother    Breast cancer Maternal Grandmother        unsure age of onset   Diabetes Paternal Grandmother    Heart disease Paternal Grandmother    Hypertension Paternal Grandmother    Diabetes Paternal Grandfather    Heart disease Paternal Grandfather    Hypertension Paternal Grandfather      Medication List:  Allergies as of 04/01/2022       Reactions   Aspirin Other (See Comments)    Stomach bleeding    Fish Allergy Diarrhea, Nausea And Vomiting, Swelling, Other (See Comments)   Stomach pain.   Naproxen Other (See Comments)   stomach bleeding    Nsaids Other (See Comments)   GI BLEED   Benzoin Rash   Shellfish Allergy Diarrhea, Nausea And Vomiting, Swelling, Rash        Medication List  Accurate as of April 01, 2022 11:59 PM. If you have any questions, ask your nurse or doctor.          STOP taking these medications    norethindrone 0.35 MG tablet Commonly known as: Ortho Micronor Stopped by: Larose Kells, MD   nystatin cream Commonly known as: MYCOSTATIN Stopped by: Larose Kells, MD       TAKE these medications    acetaminophen 500 MG tablet Commonly known as: TYLENOL Take 500-1,000 mg by mouth every 6 (six) hours as needed (pain.).   azelastine 0.1 % nasal spray Commonly known as: ASTELIN Place 1 spray into both nostrils 2 (two) times daily as needed for rhinitis or allergies. Use in each nostril as directed Started by: Larose Kells, MD    EPINEPHrine 0.3 mg/0.3 mL Soaj injection Commonly known as: EpiPen 2-Pak Inject 0.3 mg into the muscle as needed for anaphylaxis. Started by: Larose Kells, MD   famotidine 20 MG tablet Commonly known as: Pepcid Take 1 tablet (20 mg total) by mouth 2 (two) times daily as needed (hives). Started by: Larose Kells, MD   fexofenadine 180 MG tablet Commonly known as: Allegra Allergy Take 1 tablet (180 mg total) by mouth 2 (two) times daily as needed (hives). Started by: Larose Kells, MD   levocetirizine 5 MG tablet Commonly known as: XYZAL Take 5 mg by mouth daily.   sertraline 25 MG tablet Commonly known as: ZOLOFT Take 25 mg by mouth daily.         REVIEW OF SYSTEMS: Pertinent positives and negatives discussed in HPI.   Objective:   Physical Exam: BP 128/78   Pulse 72   Temp 97.6 F (36.4 C) (Temporal)   Resp 16   Ht 5' 5"$  (1.651 m)   Wt 250 lb 4.8 oz (113.5 kg)   SpO2 100%   BMI 41.65 kg/m  Body mass index is 41.65 kg/m. GEN: alert, well developed HEENT: clear conjunctiva, TM grey and translucent, nose with + inferior turbinate hypertrophy,pink nasal mucosa, slight clear rhinorrhea, no cobblestoning HEART: regular rate and rhythm, no murmur LUNGS: clear to auscultation bilaterally, no coughing, unlabored respiration ABDOMEN: soft, non distended  SKIN: no rashes or lesions  Reviewed:  11/12/2021: seen by hospital for skin yeast infection and was treated with nystatin.   11/01/2021: seen by obgyn Dr. Rollene Rotunda for rash that developed while inpatient after giving birth that keeps coming back.  Treated with PRN benadryl. Also had a rash around the wound that was thought to be an allergic reaction to adhesives.  Informed to use benadryl PRN for the other rash.    02/25/2022: seen by [facility]CP due to hives and allergy to fish. Started on Xyzal 92m daily. Referred to Allergy.   Skin Testing:  Skin prick testing was placed, which includes aeroallergens/foods,  histamine control, and saline control.  Verbal consent was obtained prior to placing test.  Patient tolerated procedure well.  Allergy testing results were read and interpreted by myself, documented by clinical staff. Adequate positive and negative control.  Results discussed with patient/family.  Airborne Adult Perc - 04/01/22 1457     Time Antigen Placed 1457    Allergen Manufacturer GLavella Hammock   Location Back    Number of Test 59    1. Control-Buffer 50% Glycerol Negative    2. Control-Histamine 1 mg/ml 3+    3. Albumin saline Negative    4. B[place]umet ParkNegative    5. B[place]  Negative    6. Johnson Negative    7. [place] Blue Negative    8. Meadow Fescue Negative    9. Perennial Rye Negative    10. Sweet Vernal Negative    11. Timothy Negative    12. Cocklebur Negative    13. Burweed Marshelder Negative    14. Ragweed, short Negative    15. Ragweed, Giant Negative    16. Plantain,  English Negative    17. Lamb's Quarters Negative    18. Sheep Sorrell Negative    19. Rough Pigweed Negative    20. Marsh Elder, Rough Negative    21. Mugwort, Common Negative    22. Ash mix Negative    23. Birch mix Negative    24. Beech American Negative    25. Box, Elder Negative    26. Cedar, red Negative    27. Cottonwood, [place] Negative    28. Elm mix Negative    29. Hickory Negative    30. Maple mix Negative    31. Oak, [place] mix Negative    32. Pecan Pollen Negative    33. Pine mix Negative    34. Sycamore Eastern Negative    35. Flatwoods, Black Pollen Negative    36. Alternaria alternata Negative    37. Cladosporium Herbarum Negative    38. Aspergillus mix Negative    39. Penicillium mix Negative    40. Bipolaris sorokiniana (Helminthosporium) Negative    41. Drechslera spicifera (Curvularia) Negative    42. Mucor plumbeus Negative    43. Fusarium moniliforme Negative    44. Aureobasidium pullulans (pullulara) Negative    45. Rhizopus oryzae Negative    46. Botrytis cinera  Negative    47. Epicoccum nigrum Negative    48. Phoma betae Negative    49. Candida Albicans Negative    50. Trichophyton mentagrophytes Negative    51. Mite, D Farinae  5,000 AU/ml Negative    52. Mite, D Pteronyssinus  5,000 AU/ml 3+    53. Cat Hair 10,000 BAU/ml Negative    54.  Dog Epithelia Negative    55. Mixed Feathers Negative    56. Horse Epithelia Negative    57. Cockroach, German Negative    58. Mouse Negative    59. Tobacco Leaf Negative             Food Adult Perc - 04/01/22 1400     Time Antigen Placed 1458    Allergen Manufacturer Lavella Hammock    Location Back    Number of allergen test 14    8. Shellfish Mix Negative    9. Fish Mix Negative    18. Catfish Negative    19. Bass Negative    20. Trout Negative    21. Tuna Negative    22. Salmon Negative    23. Flounder Negative    24. Codfish Negative    25. Shrimp Negative    26. Crab Negative    27. Lobster Negative    28. Oyster Negative    29. Scallops Negative               Assessment:   1. Idiopathic urticaria   2. Anaphylactic shock due to fish, initial encounter   3. Anaphylactic shock due to shellfish, initial encounter   4. Perennial allergic rhinitis     Plan/Recommendations:  Idiopathic Urticaria (Hives): - At this time etiology of hives and swelling is unknown. Hives can be caused by a variety of different triggers  including illness/infection, exercise, pressure, vibrations, extremes of temperature to name a few however majority of the time there is no identifiable trigger.  -If persistent, we will obtain labs to rule out inflammatory/autoimmune/alpha gal/mast cell causes. -Start Allegra 126m daily.   -If no improvement in 3-4 days, increase to Allegra 1888mtwice daily.   -If no improvement in 3-4 days, add Pepcid 2071mwice daily and continue Allegra 180m33mice daily. -If still no improvement, increase to Allegra 360mg106mce daily and Pepcid 40mg 43me daily.  Food allergy:  -  Initial sxs: when young, developed hives and vomiting 2-3 hours after fish and shellfish ingestion.  - today's skin testing 03/2022 was negative for fish and shellfish. We will obtain sIgE to fish and shellfish to confirm.  If both are negative, can consider oral food challenge.  - please strictly avoid fish and shellfish.  - for SKIN only reaction, okay to take Benadryl 25mg c38mles every 6 hours - for SKIN + ANY additional symptoms, OR IF concern for LIFE THREATENING reaction = Epipen Autoinjector EpiPen 0.3 mg. - If using Epinephrine autoinjector, call 911 or go the ER.    Allergic Rhinitis: - Symptoms of seasonal rhinitis with turbinate hypertrophy concerning for allergic trigger so skin test was performed to identify aeroallergen triggers.  - Positive skin test 03/2022: dust mite - Avoidance measures discussed. - Use nasal saline rinses before nose sprays such as with Neilmed Sinus Rinse.  Use distilled water.   - Use Azelastine 1-2 sprays each nostril twice daily as needed. Aim upward and outward. - Use Allegra 180mg da73mas needed for runny nose, sneezing, itchy watery eyes.  - Consider allergy shots as long term control of your symptoms by teaching your immune system to be more tolerant of your allergy triggers   Return in about 6 weeks (around 05/13/2022).  Annete Ayuso PaHarlon Florer[facility] and Ast[facility] CPleasant [facility]

## 2022-04-04 LAB — ALLERGEN PROFILE, FOOD-FISH
Allergen Mackerel IgE: <0.1 kU/L
Allergen Salmon IgE: <0.1 kU/L
Allergen Trout IgE: <0.1 kU/L
Allergen Walley Pike IgE: <0.1 kU/L
Codfish IgE: <0.1 kU/L
Halibut IgE: <0.1 kU/L
Tuna: <0.1 kU/L

## 2022-04-04 LAB — ALLERGEN PROFILE, SHELLFISH
Clam IgE: <0.1 kU/L
F023-IgE Crab: 0.16 kU/L — AB
F080-IgE Lobster: <0.1 kU/L
F290-IgE Oyster: <0.1 kU/L
Scallop IgE: <0.1 kU/L
Shrimp IgE: 0.13 kU/L — AB

## 2022-05-20 ENCOUNTER — Ambulatory Visit: Payer: BLUE CROSS/BLUE SHIELD | Admitting: Internal Medicine

## 2022-05-20 NOTE — Progress Notes (Deleted)
   FOLLOW UP Date of Service/Encounter:  05/20/22   Subjective:  Kathleen Hamilton (DOB: 06-Aug-1995) is a 27 y.o. female [facility] for follow up for idiopathic urticaria, fish/shellfish allergy and allergic rhinitis  History obtained from: chart review and patient. Last visit was with me on 04/01/2022 for idiopathic urticaria, fish/shellfish allergy and allergic rhinitis.  SPT and sIgE were negative for fish, shellfish low positive sIgE.  Discussed doing food challenge to delineate. SPT was also positive to DM.  Discussed Azelastine PRN and Allegra/Pepcid regimen for CSU.    Urticaria:  Food Allergy:  Rhinoconjunctivitis:    Past Medical History: Past Medical History:  Diagnosis Date   Anxiety    Dysmenorrhea    Endometriosis    Heart murmur    Pregnancy induced hypertension    Ulcerative colitis ([facility])     Objective:  There were no vitals taken for this visit. There is no height or weight on file to calculate BMI. Physical Exam: GEN: alert, well developed HEENT: clear conjunctiva, TM grey and translucent, nose with moderate inferior turbinate hypertrophy, pale nasal mucosa, clear rhinorrhea, + cobblestoning HEART: regular rate and rhythm, no murmur LUNGS: clear to auscultation bilaterally, no coughing, unlabored respiration SKIN: no rashes or lesions  Data Reviewed: *** Labs: SPT:  Spirometry:  Tracings reviewed. Her effort: {Blank single:19197::"Good reproducible efforts.","It was hard to get consistent efforts and there is a question as to whether this reflects a maximal maneuver.","Poor effort, data can not be interpreted.","Variable effort-results affected.","decent for first attempt at spirometry."} FVC: ***L FEV1: ***L, ***% predicted FEV1/FVC ratio: ***% Interpretation: {Blank single:19197::"Spirometry consistent with mild obstructive disease","Spirometry consistent with moderate obstructive disease","Spirometry  consistent with severe obstructive disease","Spirometry consistent with possible restrictive disease","Spirometry consistent with mixed obstructive and restrictive disease","Spirometry uninterpretable due to technique","Spirometry consistent with normal pattern","No overt abnormalities noted given today's efforts"}.  Please see scanned spirometry results for details.  Skin Testing:  Skin prick testing was placed, which includes aeroallergens/foods, histamine control, and saline control.  Verbal consent was obtained prior to placing test.  Patient tolerated procedure well.  Allergy testing results were read and interpreted by myself, documented by clinical staff. Adequate positive and negative control.  Positive results to:  Results discussed with patient/family.    Assessment:   No diagnosis found.  Plan/Recommendations:    There are no Patient Instructions on file for this visit.    No follow-ups on file.  Alesia Morin, MD [facility]

## 2022-06-23 ENCOUNTER — Institutional Professional Consult (permissible substitution): Payer: Medicaid Other | Admitting: Plastic Surgery

## 2022-08-03 ENCOUNTER — Ambulatory Visit: Payer: Managed Care, Other (non HMO) | Admitting: Plastic Surgery

## 2022-08-03 ENCOUNTER — Encounter: Payer: Self-pay | Admitting: Plastic Surgery

## 2022-08-03 VITALS — BP 116/79 | HR 67 | Ht 65.0 in | Wt 248.2 lb

## 2022-08-03 DIAGNOSIS — M542 Cervicalgia: Secondary | ICD-10-CM

## 2022-08-03 DIAGNOSIS — Z6841 Body Mass Index (BMI) 40.0 and over, adult: Secondary | ICD-10-CM

## 2022-08-03 DIAGNOSIS — N62 Hypertrophy of breast: Secondary | ICD-10-CM | POA: Diagnosis not present

## 2022-08-03 DIAGNOSIS — M546 Pain in thoracic spine: Secondary | ICD-10-CM | POA: Diagnosis not present

## 2022-08-03 NOTE — Progress Notes (Signed)
Referring Provider Kathleen Beams, MD [id number] [place],  [place] [address]   CC:  Chief Complaint  Patient presents with   Consult      Kathleen Hamilton is an 27 y.o. female.  HPI: [name] is a 27 year old female who presents today with complaints of upper back and neck pain and bra straps which cause grooving in her shoulders and under her breasts.  She attributes  the large size of her breast to this problem.  She is interested in surgical reduction of the size of her breast.  Allergies  Allergen Reactions   Aspirin Other (See Comments)     Stomach bleeding    Fish Allergy Diarrhea, Nausea And Vomiting, Swelling and Other (See Comments)    Stomach pain.   Naproxen Other (See Comments)    stomach bleeding    Nsaids Other (See Comments)    GI BLEED   Benzoin Rash   Shellfish Allergy Diarrhea, Nausea And Vomiting, Swelling and Rash    Outpatient Encounter Medications as of 08/03/2022  Medication Sig   acetaminophen (TYLENOL) 500 MG tablet Take 500-1,000 mg by mouth every 6 (six) hours as needed (pain.).   azelastine (ASTELIN) 0.1 % nasal spray Place 1 spray into both nostrils 2 (two) times daily as needed for rhinitis or allergies. Use in each nostril as directed   EPINEPHrine (EPIPEN 2-PAK) 0.3 mg/0.3 mL IJ SOAJ injection Inject 0.3 mg into the muscle as needed for anaphylaxis.   famotidine (PEPCID) 20 MG tablet Take 1 tablet (20 mg total) by mouth 2 (two) times daily as needed (hives).   fexofenadine (ALLEGRA ALLERGY) 180 MG tablet Take 1 tablet (180 mg total) by mouth 2 (two) times daily as needed (hives).   levocetirizine (XYZAL) 5 MG tablet Take 5 mg by mouth daily.   sertraline (ZOLOFT) 50 MG tablet Take 50 mg by mouth daily.   sertraline (ZOLOFT) 25 MG tablet Take 25 mg by mouth daily. (Patient not taking: Reported on 08/03/2022)   [DISCONTINUED] hydrochlorothiazide (HYDRODIURIL) 25 MG tablet Take 1 tablet (25 mg total) by mouth daily. (Patient not taking: Reported  on 08/27/2018)   [DISCONTINUED] iron polysaccharides (NIFEREX) 150 MG capsule Take 1 capsule (150 mg total) by mouth daily. (Patient not taking: Reported on 08/27/2018)   No facility-administered encounter medications on file as of 08/03/2022.     Past Medical History:  Diagnosis Date   Anxiety    Dysmenorrhea    Endometriosis    Heart murmur    Pregnancy induced hypertension    Ulcerative colitis [facility])     Past Surgical History:  Procedure Laterality Date   BIOPSY BOWEL     CESAREAN SECTION MULTI-GESTATIONAL N/A 10/28/2021   Procedure: CESAREAN SECTION MULTI-GESTATIONAL;  Surgeon: Kathleen Bowens, MD;  Location: [facility];  Service: Obstetrics;  Laterality: N/A;   COLON BIOPSY     hx of colon problems.  ? mega colon, awaiting bx results   NO PAST SURGERIES      Family History  Problem Relation Age of Onset   Healthy Mother    Healthy Father    GI problems Brother    Diabetes Maternal Grandmother    Hypertension Maternal Grandmother    Heart disease Maternal Grandmother    Breast cancer Maternal Grandmother        unsure age of onset   Diabetes Paternal Grandmother    Heart disease Paternal Grandmother    Hypertension Paternal Grandmother    Diabetes Paternal Grandfather  Heart disease Paternal Grandfather    Hypertension Paternal Grandfather     Social History   Social History Narrative   Not on file     Review of Systems General: Denies fevers, chills, weight loss CV: Denies chest pain, shortness of breath, palpitations Breast: Patient denies any specific complaints with her breast.  She does however feel that the large size of her breasts are contributing to her upper back and neck pain.  Physical Exam    08/03/2022    9:21 AM 04/01/2022    2:15 PM 12/08/2021    1:39 PM  Vitals with BMI  Height 5\' 5"  5\' 5"  5\' 5"   Weight 248 lbs 3 oz 250 lbs 5 oz 240 lbs 6 oz  BMI 41.3 41.65 40  Systolic 116 128 161  Diastolic 79 78 64  Pulse 67 72 73    General:  No  acute distress,  Alert and oriented, Non-Toxic, Normal speech and affect Breast: Patient has very large pendulous breast with grade 3 ptosis.  There are no dominant masses on physical exam and the nipples are normal in appearance.  Her sternal notch to nipple distance is 40 cm on the right and 40 cm on the left her nipple to fold distance is 17 cm on the right and 17 cm on the left. Mammogram: Not applicable due to patient's age Assessment/Plan Macromastia: Patient has significant macromastia.  I believe that I can remove between 800 g and 900 g per breast.  I have discussed breast reductions at length with the patient.  We have discussed the location of the incisions and the unpredictable nature of scarring.  We have discussed the potential risks of bleeding, infection, and seroma formation.  She does understand that I will use drains postoperatively.  These will most likely be removed the day after surgery.  She also understands that there is a risk of nipple loss due to due to nipple ischemia.  She is at a slightly higher risk for this due to the length of her breast firm sternal notch to nipple distance.  She also understands that it may be difficult to breast-feed after a breast reduction.  Her postoperative restrictions will include no heavy lifting greater than 20 pounds, no vigorous activity, and no submerging the incisions in water for 6 weeks.  She is requested to begin ambulating immediately after surgery as this helps to decrease the risk of DVT.  She may return to light activity as she tolerates however I have suggested that she might take at least 1 week off from work.  All questions were answered to her satisfaction.  Photographs were obtained today with her consent.  Will schedule surgery at her request.  Kathleen Hamilton 08/03/2022, 10:23 AM

## 2022-09-26 ENCOUNTER — Other Ambulatory Visit: Payer: Self-pay

## 2022-09-26 ENCOUNTER — Emergency Department (HOSPITAL_BASED_OUTPATIENT_CLINIC_OR_DEPARTMENT_OTHER)
Admission: EM | Admit: 2022-09-26 | Discharge: 2022-09-26 | Disposition: A | Discharge status: Home / Self Care | Payer: Managed Care, Other (non HMO) | Attending: Emergency Medicine | Admitting: Emergency Medicine

## 2022-09-26 ENCOUNTER — Encounter (HOSPITAL_BASED_OUTPATIENT_CLINIC_OR_DEPARTMENT_OTHER): Payer: Self-pay | Admitting: Emergency Medicine

## 2022-09-26 DIAGNOSIS — F1010 Alcohol abuse, uncomplicated: Secondary | ICD-10-CM | POA: Insufficient documentation

## 2022-09-26 DIAGNOSIS — R519 Headache, unspecified: Secondary | ICD-10-CM | POA: Insufficient documentation

## 2022-09-26 DIAGNOSIS — R110 Nausea: Secondary | ICD-10-CM

## 2022-09-26 DIAGNOSIS — Y900 Blood alcohol level of less than 20 mg/100 ml: Secondary | ICD-10-CM | POA: Insufficient documentation

## 2022-09-26 DIAGNOSIS — R112 Nausea with vomiting, unspecified: Secondary | ICD-10-CM | POA: Diagnosis not present

## 2022-09-26 LAB — URINALYSIS, ROUTINE W REFLEX MICROSCOPIC
Bilirubin Urine: NEGATIVE
Glucose, UA: NEGATIVE mg/dL
Hgb urine dipstick: NEGATIVE
Ketones, ur: 40 mg/dL — AB
Nitrite: NEGATIVE
Protein, ur: NEGATIVE mg/dL
Specific Gravity, Urine: 1.014 (ref 1.005–1.030)
pH: 6.5 (ref 5.0–8.0)

## 2022-09-26 LAB — CBC
HCT: 36.1 % (ref 36.0–46.0)
Hemoglobin: 12.2 g/dL (ref 12.0–15.0)
MCH: 28.6 pg (ref 26.0–34.0)
MCHC: 33.8 g/dL (ref 30.0–36.0)
MCV: 84.7 fL (ref 80.0–100.0)
Platelets: 439 10*3/uL — ABNORMAL HIGH (ref 150–400)
RBC: 4.26 MIL/uL (ref 3.87–5.11)
RDW: 14.8 % (ref 11.5–15.5)
WBC: 9.9 10*3/uL (ref 4.0–10.5)
nRBC: 0 % (ref 0.0–0.2)

## 2022-09-26 LAB — COMPREHENSIVE METABOLIC PANEL
ALT: 21 U/L (ref 0–44)
AST: 20 U/L (ref 15–41)
Albumin: 4.1 g/dL (ref 3.5–5.0)
Alkaline Phosphatase: 38 U/L (ref 38–126)
Anion gap: 8 (ref 5–15)
BUN: 9 mg/dL (ref 6–20)
CO2: 23 mmol/L (ref 22–32)
Calcium: 8.8 mg/dL — ABNORMAL LOW (ref 8.9–10.3)
Chloride: 107 mmol/L (ref 98–111)
Creatinine, Ser: 0.61 mg/dL (ref 0.44–1.00)
GFR, Estimated: >60 mL/min (ref >60)
Glucose, Bld: 89 mg/dL (ref 70–99)
Potassium: 3.9 mmol/L (ref 3.5–5.1)
Sodium: 138 mmol/L (ref 135–145)
Total Bilirubin: 0.4 mg/dL (ref 0.3–1.2)
Total Protein: 7.2 g/dL (ref 6.5–8.1)

## 2022-09-26 LAB — ETHANOL: Alcohol, Ethyl (B): <10 mg/dL (ref <10)

## 2022-09-26 LAB — LIPASE, BLOOD: Lipase: 15 U/L (ref 11–51)

## 2022-09-26 LAB — PREGNANCY, URINE: Preg Test, Ur: NEGATIVE

## 2022-09-26 MED ORDER — ONDANSETRON 4 MG PO TBDP
4.0000 mg | ORAL_TABLET | Freq: Three times a day (TID) | ORAL | 0 refills | Status: DC | PRN
Start: 2022-09-26 — End: 2022-10-26

## 2022-09-26 MED ORDER — SODIUM CHLORIDE 0.9 % IV BOLUS
1000.0000 mL | Freq: Once | INTRAVENOUS | Status: AC
  Administered 2022-09-26: 1000 mL via INTRAVENOUS

## 2022-09-26 MED ORDER — ONDANSETRON HCL 4 MG/2ML IJ SOLN
4.0000 mg | Freq: Once | INTRAMUSCULAR | Status: AC
  Administered 2022-09-26: 4 mg via INTRAVENOUS
  Filled 2022-09-26: qty 2

## 2022-09-26 NOTE — ED Triage Notes (Signed)
Pt arrives to ED with c/o headache, emesis, diarrhea, and chills after drinking too much alcohol on 8/10.

## 2022-09-26 NOTE — Discharge Instructions (Addendum)
You are seen in the emergency department today for vomiting and headaches.  You were given fluids and nausea medication for your symptoms.  Your labs were thankfully reassuring without any acute concerns.  Please return to the emergency department if your symptoms are worsening.

## 2022-09-26 NOTE — ED Provider Notes (Signed)
[place] EMERGENCY DEPARTMENT AT [facility] Provider Note   CSN: [id number] Arrival date & time: 09/26/22  1835     History Chief Complaint  Patient presents with   Emesis   Headache    Kathleen Hamilton is a 27 y.o. female.  Patient presents to the emergency department complaints of vomiting and headache.  Reports that she had a week an episode of heavy alcohol drinking and feels that she may have blacked out.  Reports that since then, she has not some headache, vomiting, diarrhea, and chills.  Denies any fever, sore throat, abdominal pain, urinary symptoms.  Reports he typically only drinks socially and very rarely drinks to the point of blacking out.  Cannot recall if there is any other substance that she may have taken during that time.  No prior history of substance abuse or IV drug use.   Emesis Associated symptoms: headaches   Headache Associated symptoms: vomiting        Home Medications Prior to Admission medications   Medication Sig Start Date End Date Taking? Authorizing Provider  ondansetron (ZOFRAN-ODT) 4 MG disintegrating tablet Take 1 tablet (4 mg total) by mouth every 8 (eight) hours as needed. 09/26/22  Yes Smitty Knudsen, PA-C  acetaminophen (TYLENOL) 500 MG tablet Take 500-1,000 mg by mouth every 6 (six) hours as needed (pain.).    [provider]  azelastine (ASTELIN) 0.1 % nasal spray Place 1 spray into both nostrils 2 (two) times daily as needed for rhinitis or allergies. Use in each nostril as directed 04/01/22   Birder Robson, MD  EPINEPHrine (EPIPEN 2-PAK) 0.3 mg/0.3 mL IJ SOAJ injection Inject 0.3 mg into the muscle as needed for anaphylaxis. 04/01/22   Birder Robson, MD  famotidine (PEPCID) 20 MG tablet Take 1 tablet (20 mg total) by mouth 2 (two) times daily as needed (hives). 04/01/22   Birder Robson, MD  fexofenadine [facility] ALLERGY) 180 MG tablet Take 1 tablet (180 mg total) by mouth 2 (two) times daily as needed (hives). 04/01/22    Birder Robson, MD  levocetirizine (XYZAL) 5 MG tablet Take 5 mg by mouth daily.    [provider]  sertraline (ZOLOFT) 25 MG tablet Take 25 mg by mouth daily. Patient not taking: Reported on 08/03/2022 11/12/21   [provider]  sertraline (ZOLOFT) 50 MG tablet Take 50 mg by mouth daily.    [provider]  hydrochlorothiazide (HYDRODIURIL) 25 MG tablet Take 1 tablet (25 mg total) by mouth daily. Patient not taking: Reported on 08/27/2018 07/13/18 08/27/18  Karena Addison, CNM  iron polysaccharides (NIFEREX) 150 MG capsule Take 1 capsule (150 mg total) by mouth daily. Patient not taking: Reported on 08/27/2018 07/14/18 08/27/18  Karena Addison, CNM      Allergies    Aspirin, Fish allergy, Naproxen, Nsaids, Benzoin, and Shellfish allergy    Review of Systems   Review of Systems  Gastrointestinal:  Positive for vomiting.  Neurological:  Positive for headaches.    Physical Exam Updated Vital Signs BP 125/81   Pulse 63   Temp 98.6 F (37 C) (Oral)   Resp 16   Ht 5\' 5"  (1.651 m)   Wt 108 kg   SpO2 100%   Breastfeeding No   BMI 39.61 kg/m  Physical Exam Vitals and nursing note reviewed.  Constitutional:      General: She is not in acute distress.    Appearance: She is well-developed.  HENT:  Head: Normocephalic and atraumatic.  Eyes:     Conjunctiva/sclera: Conjunctivae normal.  Cardiovascular:     Rate and Rhythm: Normal rate and regular rhythm.     Heart sounds: No murmur heard. Pulmonary:     Effort: Pulmonary effort is normal. No respiratory distress.     Breath sounds: Normal breath sounds.  Abdominal:     Palpations: Abdomen is soft.     Tenderness: There is no abdominal tenderness.  Musculoskeletal:        General: No swelling.     Cervical back: Neck supple.  Skin:    General: Skin is warm and dry.     Capillary Refill: Capillary refill takes less than 2 seconds.  Neurological:     Mental Status: She is alert.  Psychiatric:         Mood and Affect: Mood normal.     ED Results / Procedures / Treatments   Labs (all labs ordered are listed, but only abnormal results are displayed) Labs Reviewed  COMPREHENSIVE METABOLIC PANEL - Abnormal; Notable for the following components:      Result Value   Calcium 8.8 (*)    All other components within normal limits  CBC - Abnormal; Notable for the following components:   Platelets 439 (*)    All other components within normal limits  URINALYSIS, ROUTINE W REFLEX MICROSCOPIC - Abnormal; Notable for the following components:   Ketones, ur 40 (*)    Leukocytes,Ua SMALL (*)    Bacteria, UA FEW (*)    All other components within normal limits  LIPASE, BLOOD  PREGNANCY, URINE  ETHANOL    EKG None  Radiology No results found.  Procedures Procedures   Medications Ordered in ED Medications  sodium chloride 0.9 % bolus 1,000 mL (1,000 mLs Intravenous New Bag/Given 09/26/22 2216)  ondansetron (ZOFRAN) injection 4 mg (4 mg Intravenous Given 09/26/22 2216)    ED Course/ Medical Decision Making/ A&P                               Medical Decision Making Amount and/or Complexity of Data Reviewed Labs: ordered.  Risk Prescription drug management.   This patient presents to the ED for concern of vomiting, headache.  Differential diagnosis includes appendicitis, bowel obstruction, UTI, alcohol intoxication, alcohol withdrawal   Lab Tests:  I Ordered, and personally interpreted labs.  The pertinent results include: CBC unremarkable, CMP unremarkable, UA with possible signs of developing infection with small leukocytes and few bacteria, urine pregnancy negative, lipase normal, ethanol pending   Medicines ordered and prescription drug management:  I ordered medication including fluids, Zofran for dehydration, nausea Reevaluation of the patient after these medicines showed that the patient improved I have reviewed the patients home medicines and have made  adjustments as needed   Problem List / ED Course:  Patient presents to the emergency department complaints of vomiting and headache.  Reports that she drank heavily on 09/24/2022.  Since then, she has had the symptoms.  Describes drinking as heavy drinking with a period of being blacked out.  Reports she typically does not drink heavily and only drinks in social settings in small amounts.  Denies any abdominal pain, chest pain, shortness of breath.  Low concern for any other acute pathology at this time and believe this is likely due to alcohol use.  Patient does not appear to be acutely toxic at this time however.  Will treat  symptomatically with fluids and Zofran.  Lab evaluation including CBC, CMP, urine pregnancy, UA, lipase, ethanol.  EKG also ordered. Labs are thankfully unremarkable.  No acute signs of infection or electrolyte deficiencies.  No signs of urinary infection at this time.  Lipase normal.  Tylenol pending.  EKG unremarkable. Patient given fluids and Zofran with significant improvement.  Will plan on discharging patient home with Zofran for home use.  Discussed tricked return precautions the patient.  Patient agreeable to treatment plan verbalized understanding all return precautions.  All questions answered prior to patient discharge.  Final Clinical Impression(s) / ED Diagnoses Final diagnoses:  Nausea  Alcohol consumption binge drinking    Rx / DC Orders ED Discharge Orders          Ordered    ondansetron (ZOFRAN-ODT) 4 MG disintegrating tablet  Every 8 hours PRN        09/26/22 2305              Smitty Knudsen, PA-C 09/26/22 2307    Vanetta Mulders, MD 09/27/22 1514

## 2022-09-26 NOTE — ED Notes (Signed)
Reviewed AVS with patient, patient expressed understanding of directions, denies further questions at this time. 

## 2022-10-26 ENCOUNTER — Other Ambulatory Visit (HOSPITAL_COMMUNITY)
Admission: RE | Admit: 2022-10-26 | Discharge: 2022-10-26 | Disposition: A | Discharge status: Home / Self Care | Payer: Managed Care, Other (non HMO) | Source: Ambulatory Visit | Attending: Obstetrics & Gynecology | Admitting: Obstetrics & Gynecology

## 2022-10-26 ENCOUNTER — Encounter: Payer: Self-pay | Admitting: Obstetrics & Gynecology

## 2022-10-26 ENCOUNTER — Ambulatory Visit (INDEPENDENT_AMBULATORY_CARE_PROVIDER_SITE_OTHER): Payer: Managed Care, Other (non HMO) | Admitting: Obstetrics & Gynecology

## 2022-10-26 VITALS — BP 133/80 | HR 84 | Ht 65.0 in | Wt 242.4 lb

## 2022-10-26 DIAGNOSIS — N898 Other specified noninflammatory disorders of vagina: Secondary | ICD-10-CM | POA: Insufficient documentation

## 2022-10-26 DIAGNOSIS — R3989 Other symptoms and signs involving the genitourinary system: Secondary | ICD-10-CM | POA: Diagnosis not present

## 2022-10-26 DIAGNOSIS — N920 Excessive and frequent menstruation with regular cycle: Secondary | ICD-10-CM | POA: Diagnosis not present

## 2022-10-26 DIAGNOSIS — Z30011 Encounter for initial prescription of contraceptive pills: Secondary | ICD-10-CM | POA: Diagnosis not present

## 2022-10-26 MED ORDER — NORETHIN ACE-ETH ESTRAD-FE 1-20 MG-MCG(24) PO TABS
1.0000 | ORAL_TABLET | Freq: Every day | ORAL | 4 refills | Status: DC
Start: 2022-10-26 — End: 2023-01-25

## 2022-10-26 NOTE — Progress Notes (Signed)
GYN VISIT Patient name: Kathleen Hamilton MRN [id number]  Date of birth: Aug 17, 1995 Chief Complaint:   Menstrual Problem (/)  History of Present Illness:   Kathleen Hamilton is a 27 y.o. [phone] female being seen today for the following concerns:  1) Menses are irregular- typically monthly.  Notes periods last for 4 days- changing a pad every 2 hours.  With passage of grape-sized clots.   Denies considerable dysmenorrhea.    New partner-STI screening negative, +yeast.  No new partners since testing completed.  2) Urinary concerns: She also notes burning with urination and suprapubic tenderness.  No fever or chills.  Some back pain.     3) Vaginal irritation: Notes significant external itching- intermittent.  Denies irregular discharge.  Denies change in body wash recent fragrance or other skin exposure. Patient's last menstrual period was 10/21/2022.    Review of Systems:   Pertinent items are noted in HPI Denies fever/chills, dizziness, headaches, visual disturbances, fatigue, shortness of breath, chest pain, abdominal pain, vomiting.  No problems with bowel movements, urination, or intercourse unless otherwise stated above.  Pertinent History Reviewed:   Past Surgical History:  Procedure Laterality Date   BIOPSY BOWEL     CESAREAN SECTION MULTI-GESTATIONAL N/A 10/28/2021   Procedure: CESAREAN SECTION MULTI-GESTATIONAL;  Surgeon: Conan Bowens, MD;  Location: [facility] LD ORS;  Service: Obstetrics;  Laterality: N/A;   COLON BIOPSY     hx of colon problems.  ? mega colon, awaiting bx results   NO PAST SURGERIES      Past Medical History:  Diagnosis Date   Anxiety    Dysmenorrhea    Endometriosis    Heart murmur    Pregnancy induced hypertension    Ulcerative colitis (HCC)    Reviewed problem list, medications and allergies. Physical Assessment:   Vitals:   10/26/22 0849  BP: 133/80  Pulse: 84  Weight: 242 lb 6.4 oz (110 kg)  Height: 5\' 5"  (1.651 m)  Body mass index is 40.34  kg/m.       Physical Examination:   General appearance: alert, well appearing, and in no distress  Psych: mood appropriate, normal affect  Skin: warm & dry   Cardiovascular: normal heart rate noted  Respiratory: normal respiratory effort, no distress  Abdomen: soft, non-tender   Pelvic: VULVA: normal appearing labia majora.  Vestibule appears erythematous with slight diffuse tenderness to palpation.  No discrete abnormality noted VAGINA: normal appearing vagina with normal color and discharge, no lesions, CERVIX: normal appearing cervix without discharge or lesions, UTERUS: uterus is normal size, shape, consistency and nontender  Back: no CVA tenderness  Extremities: no edema   Chaperone: Faith Rogue    Assessment & Plan:  1) vulvar/vaginal irritation -Vaginitis panel collected, will rule out underlying infection -Encouraged patient to consider if she has expose this area to a new irritant such as body wash, bubble bath or other []  If swab negative may consider low-dose steroid ointment  2) HMB/contraceptive management -Discussed potential options including Lysteda -Order discussed options for both contraception and management of her periods including pills, patch, ring or LARCs -Patient previously on pills without issues, noted significant weight gain with Depo  OCP risk assessment: Pt denies personal history of VTE, stroke or heart attack.  Denies personal h/o breast cancer.  Pt is either a non-smoker or smoker under the age of 27yo.  Denies h/o migraines with aura  -Plan to start on pill, follow-up in 3 months  3) Urinary concerns: -No acute  abnormalities noted on exam -UA and culture collected further management pending results  Meds ordered this encounter  Medications   Norethindrone Acetate-Ethinyl Estrad-FE (LOESTRIN 24 FE) 1-20 MG-MCG(24) tablet    Sig: Take 1 tablet by mouth daily. May skip placebo week    Dispense:  90 tablet    Refill:  4       Orders  Placed This Encounter  Procedures   Urine Culture   Urinalysis    Return in about 3 months (around 01/25/2023) for Medication follow up, with Dr. Charlotta Newton.   Myna Hidalgo, DO Attending Obstetrician & Gynecologist, [facility] for [employer], [facility]

## 2022-10-27 ENCOUNTER — Ambulatory Visit: Payer: Managed Care, Other (non HMO) | Admitting: Surgical

## 2022-10-27 ENCOUNTER — Encounter: Payer: Managed Care, Other (non HMO) | Admitting: Surgical

## 2022-10-27 ENCOUNTER — Other Ambulatory Visit: Payer: Self-pay | Admitting: Obstetrics & Gynecology

## 2022-10-27 ENCOUNTER — Encounter: Payer: Self-pay | Admitting: Surgical

## 2022-10-27 VITALS — BP 106/72 | HR 65 | Ht 65.0 in | Wt 241.8 lb

## 2022-10-27 DIAGNOSIS — N62 Hypertrophy of breast: Secondary | ICD-10-CM

## 2022-10-27 DIAGNOSIS — B9689 Other specified bacterial agents as the cause of diseases classified elsewhere: Secondary | ICD-10-CM

## 2022-10-27 LAB — CERVICOVAGINAL ANCILLARY ONLY
Bacterial Vaginitis (gardnerella): POSITIVE — AB
Candida Glabrata: NEGATIVE
Candida Vaginitis: NEGATIVE
Chlamydia: NEGATIVE
Comment: NEGATIVE
Comment: NEGATIVE
Comment: NEGATIVE
Comment: NEGATIVE
Comment: NEGATIVE
Comment: NORMAL
Neisseria Gonorrhea: NEGATIVE
Trichomonas: NEGATIVE

## 2022-10-27 LAB — URINALYSIS
Bilirubin, UA: NEGATIVE
Glucose, UA: NEGATIVE
Ketones, UA: NEGATIVE
Nitrite, UA: NEGATIVE
Protein,UA: NEGATIVE
RBC, UA: NEGATIVE
Specific Gravity, UA: 1.019 (ref 1.005–1.030)
Urobilinogen, Ur: 0.2 mg/dL (ref 0.2–1.0)
pH, UA: 5.5 (ref 5.0–7.5)

## 2022-10-27 MED ORDER — OXYCODONE HCL 5 MG PO TABS: 5.0000 mg | ORAL_TABLET | Freq: Four times a day (QID) | ORAL | 0 refills | Status: AC | PRN

## 2022-10-27 MED ORDER — METRONIDAZOLE 500 MG PO TABS
500.0000 mg | ORAL_TABLET | Freq: Two times a day (BID) | ORAL | 0 refills | Status: AC
Start: 2022-10-27 — End: 2022-11-03

## 2022-10-27 MED ORDER — ONDANSETRON HCL 4 MG PO TABS: 4.0000 mg | ORAL_TABLET | Freq: Three times a day (TID) | ORAL | 0 refills | Status: DC | PRN

## 2022-10-27 NOTE — Progress Notes (Signed)
Patient ID: Kathleen Hamilton, female    DOB: 10/15/1995, 27 y.o.   MRN: [id number]  Chief Complaint  Patient presents with   Pre-op Exam      ICD-10-CM   1. Macromastia  N62       History of Present Illness: Kathleen Hamilton is a 27 y.o.  female  with a history of macromastia.  She presents for preoperative evaluation for upcoming procedure, Bilateral Breast Reduction, scheduled for 11/11/2022 with Dr.  Ladona Ridgel  The patient has not had problems with anesthesia. No history of DVT/PE.  No family or personal history of bleeding or clotting disorders.  Patient is not currently taking any blood thinners.  No history of CVA/MI.   Patient does report that her mother has a history of DVTs and other blood clots that have developed throughout her body, patient reports that this all occurred around a diagnosis of SJS after reaction to an antibiotic.  She reports that otherwise she is not aware of any other family history of DVTs.  Patient does report that she has a diagnosis of ulcerative colitis.  She denies any cardiac or pulmonary disease.  She reports she feels well with no recent changes to her health.  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  She is going to reach out to her boss to discuss and will update [place].  PMH Significant for: Ulcerative colitis, macromastia  Patient reports that she was recently prescribed OCPs, reports she has not started these yet and she was planning to take them mostly for heavy menstrual flow, however she is going to hold off on these until after surgery.  Patient reports she is a 38G cup and would like to be about a D cup.  She is aware that breast size cannot be guaranteed and confirmed that she discussed this with Dr. Ladona Ridgel at the consult.  Past Medical  History: Allergies: Allergies  Allergen Reactions   Aspirin Other (See Comments)     Stomach bleeding    Fish Allergy Diarrhea, Nausea And Vomiting, Swelling and Other (See Comments)    Stomach pain.   Naproxen Other (See Comments)    stomach bleeding    Nsaids Other (See Comments)    GI BLEED   Benzoin Rash   Shellfish Allergy Diarrhea, Nausea And Vomiting, Swelling and Rash    Current Medications:  Current Outpatient Medications:    Norethindrone Acetate-Ethinyl Estrad-FE (LOESTRIN 24 FE) 1-20 MG-MCG(24) tablet, Take 1 tablet by mouth daily. May skip placebo week, Disp: 90 tablet, Rfl: 4   sertraline (ZOLOFT) 50 MG tablet, Take 50 mg by mouth daily., Disp: , Rfl:   Past Medical Problems: Past Medical History:  Diagnosis Date   Anxiety    Dysmenorrhea    Endometriosis    Heart murmur    Pregnancy induced hypertension    Ulcerative colitis (HCC)     Past Surgical History: Past Surgical History:  Procedure Laterality Date   BIOPSY BOWEL     CESAREAN SECTION MULTI-GESTATIONAL N/A 10/28/2021   Procedure: CESAREAN SECTION MULTI-GESTATIONAL;  Surgeon: Conan Bowens, MD;  Location: [facility] LD ORS;  Service: Obstetrics;  Laterality: N/A;   COLON BIOPSY     hx of colon problems.  ? mega colon, awaiting bx  results   NO PAST SURGERIES      Social History: Social History   Socioeconomic History   Marital status: Significant Other    Spouse name: Not on file   Number of children: Not on file   Years of education: Not on file   Highest education level: Not on file  Occupational History   Not on file  Tobacco Use   Smoking status: Never    Passive exposure: Never   Smokeless tobacco: Never  Vaping Use   Vaping status: Never Used  Substance and Sexual Activity   Alcohol use: No   Drug use: No   Sexual activity: Yes    Birth control/protection: None  Other Topics Concern   Not on file  Social History Narrative   Not on file   Social Determinants of Health    Financial Resource Strain: Low Risk  (12/08/2021)   Overall Financial Resource Strain (CARDIA)    Difficulty of Paying Living Expenses: Not hard at all  Food Insecurity: No Food Insecurity (12/08/2021)   Hunger Vital Sign    Worried About Running Out of Food in the Last Year: Never true    Ran Out of Food in the Last Year: Never true  Transportation Needs: No Transportation Needs (12/08/2021)   PRAPARE - Administrator, Civil Service (Medical): No    Lack of Transportation (Non-Medical): No  Physical Activity: Inactive (12/08/2021)   Exercise Vital Sign    Days of Exercise per Week: 0 days    Minutes of Exercise per Session: 0 min  Stress: No Stress Concern Present (12/08/2021)   [employer] of Occupational Health - Occupational Stress Questionnaire    Feeling of Stress : Only a little  Social Connections: Unknown (09/08/2022)   Received from [facility]   Social Network    Social Network: Not on file  Intimate Partner Violence: Unknown (09/08/2022)   Received from [facility]   HITS    Physically Hurt: Not on file    Insult or Talk Down To: Not on file    Threaten Physical Harm: Not on file    Scream or Curse: Not on file    Family History: Family History  Problem Relation Age of Onset   Healthy Mother    Healthy Father    GI problems Brother    Diabetes Maternal Grandmother    Hypertension Maternal Grandmother    Heart disease Maternal Grandmother    Breast cancer Maternal Grandmother        unsure age of onset   Diabetes Paternal Grandmother    Heart disease Paternal Grandmother    Hypertension Paternal Grandmother    Diabetes Paternal Grandfather    Heart disease Paternal Grandfather    Hypertension Paternal Grandfather     Review of Systems: Review of Systems  Constitutional: Negative.   Respiratory: Negative.    Cardiovascular: Negative.   Gastrointestinal: Negative.   Neurological: Negative.     Physical Exam: Vital Signs BP  106/72 (BP Location: Left Arm, Patient Position: Sitting, Cuff Size: Large)   Pulse 65   Ht 5\' 5"  (1.651 m)   Wt 241 lb 12.8 oz (109.7 kg)   LMP 10/21/2022   SpO2 99%   BMI 40.24 kg/m   Physical Exam Constitutional:      General: Not in acute distress.    Appearance: Normal appearance. Not ill-appearing.  HENT:     Head: Normocephalic and atraumatic.  Eyes:     Pupils: Pupils  are equal, round Neck:     Musculoskeletal: Normal range of motion.  Cardiovascular:     Rate and Rhythm: Normal rate    Pulses: Normal pulses.  Pulmonary:     Effort: Pulmonary effort is normal. No respiratory distress.  Musculoskeletal: Normal range of motion.  Skin:    General: Skin is warm and dry.     Findings: No erythema or rash.  Neurological:     General: No focal deficit present.     Mental Status: Alert and oriented to person, place, and time. Mental status is at baseline.     Motor: No weakness.  Psychiatric:        Mood and Affect: Mood normal.        Behavior: Behavior normal.    Assessment/Plan: The patient is scheduled for bilateral breast reduction with Dr. Ladona Ridgel.  Risks, benefits, and alternatives of procedure discussed, questions answered and consent obtained.    Smoking Status: Non-smoker; Counseling Given?  N/A Last Mammogram: No history of mammogram due to age, not required  Caprini Score: 8; Risk Factors include: History of ulcerative colitis, mother with history of DVTs (provoked by SJS and prolonged hospitalization), BMI greater than 40, length of planned surgery. Recommendation for mechanical and possible pharmacological prophylaxis. Encourage early ambulation.   Pictures obtained: @consult   Post-op Rx sent to pharmacy: Oxycodone, Zofran  Patient was provided with the breast reduction and General Surgical Risk consent document and Pain Medication Agreement prior to their appointment.  They had adequate time to read through the risk consent documents and Pain Medication  Agreement. We also discussed them in person together during this preop appointment. All of their questions were answered to their satisfaction.  Recommended calling if they have any further questions.  Risk consent form and Pain Medication Agreement to be scanned into patient's chart.  The risk that can be encountered with breast reduction were discussed and include the following but not limited to these:  Breast asymmetry, fluid accumulation, firmness of the breast, inability to breast feed, loss of nipple or areola, skin loss, decrease or no nipple sensation, fat necrosis of the breast tissue, bleeding, infection, healing delay.  There are risks of anesthesia, changes to skin sensation and injury to nerves or blood vessels.  The muscle can be temporarily or permanently injured.  You may have an allergic reaction to tape, suture, glue, blood products which can result in skin discoloration, swelling, pain, skin lesions, poor healing.  Any of these can lead to the need for revisonal surgery or stage procedures.  A reduction has potential to interfere with diagnostic procedures.  Nipple or breast piercing can increase risks of infection.  This procedure is best done when the breast is fully developed.  Changes in the breast will continue to occur over time.  Pregnancy can alter the outcomes of previous breast reduction surgery, weight gain and weigh loss can also effect the long term appearance.    Electronically signed by: Kermit Balo Arney Mayabb, PA-C 10/27/2022 10:01 AM

## 2022-10-27 NOTE — Progress Notes (Signed)
Rx for BV sent in  Myna Hidalgo, DO Attending Obstetrician & Gynecologist, [facility] for [facility], [facility]

## 2022-10-27 NOTE — H&P (View-Only) (Signed)
Patient ID: Kathleen Hamilton, female    DOB: 10/15/1995, 27 y.o.   MRN: [id number]  Chief Complaint  Patient presents with   Pre-op Exam      ICD-10-CM   1. Macromastia  N62       History of Present Illness: Kathleen Hamilton is a 27 y.o.  female  with a history of macromastia.  She presents for preoperative evaluation for upcoming procedure, Bilateral Breast Reduction, scheduled for 11/11/2022 with Dr.  Ladona Ridgel  The patient has not had problems with anesthesia. No history of DVT/PE.  No family or personal history of bleeding or clotting disorders.  Patient is not currently taking any blood thinners.  No history of CVA/MI.   Patient does report that her mother has a history of DVTs and other blood clots that have developed throughout her body, patient reports that this all occurred around a diagnosis of SJS after reaction to an antibiotic.  She reports that otherwise she is not aware of any other family history of DVTs.  Patient does report that she has a diagnosis of ulcerative colitis.  She denies any cardiac or pulmonary disease.  She reports she feels well with no recent changes to her health.  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  She is going to reach out to her boss to discuss and will update [place].  PMH Significant for: Ulcerative colitis, macromastia  Patient reports that she was recently prescribed OCPs, reports she has not started these yet and she was planning to take them mostly for heavy menstrual flow, however she is going to hold off on these until after surgery.  Patient reports she is a 38G cup and would like to be about a D cup.  She is aware that breast size cannot be guaranteed and confirmed that she discussed this with Dr. Ladona Ridgel at the consult.  Past Medical  History: Allergies: Allergies  Allergen Reactions   Aspirin Other (See Comments)     Stomach bleeding    Fish Allergy Diarrhea, Nausea And Vomiting, Swelling and Other (See Comments)    Stomach pain.   Naproxen Other (See Comments)    stomach bleeding    Nsaids Other (See Comments)    GI BLEED   Benzoin Rash   Shellfish Allergy Diarrhea, Nausea And Vomiting, Swelling and Rash    Current Medications:  Current Outpatient Medications:    Norethindrone Acetate-Ethinyl Estrad-FE (LOESTRIN 24 FE) 1-20 MG-MCG(24) tablet, Take 1 tablet by mouth daily. May skip placebo week, Disp: 90 tablet, Rfl: 4   sertraline (ZOLOFT) 50 MG tablet, Take 50 mg by mouth daily., Disp: , Rfl:   Past Medical Problems: Past Medical History:  Diagnosis Date   Anxiety    Dysmenorrhea    Endometriosis    Heart murmur    Pregnancy induced hypertension    Ulcerative colitis (HCC)     Past Surgical History: Past Surgical History:  Procedure Laterality Date   BIOPSY BOWEL     CESAREAN SECTION MULTI-GESTATIONAL N/A 10/28/2021   Procedure: CESAREAN SECTION MULTI-GESTATIONAL;  Surgeon: Conan Bowens, MD;  Location: [facility] LD ORS;  Service: Obstetrics;  Laterality: N/A;   COLON BIOPSY     hx of colon problems.  ? mega colon, awaiting bx  results   NO PAST SURGERIES      Social History: Social History   Socioeconomic History   Marital status: Significant Other    Spouse name: Not on file   Number of children: Not on file   Years of education: Not on file   Highest education level: Not on file  Occupational History   Not on file  Tobacco Use   Smoking status: Never    Passive exposure: Never   Smokeless tobacco: Never  Vaping Use   Vaping status: Never Used  Substance and Sexual Activity   Alcohol use: No   Drug use: No   Sexual activity: Yes    Birth control/protection: None  Other Topics Concern   Not on file  Social History Narrative   Not on file   Social Determinants of Health    Financial Resource Strain: Low Risk  (12/08/2021)   Overall Financial Resource Strain (CARDIA)    Difficulty of Paying Living Expenses: Not hard at all  Food Insecurity: No Food Insecurity (12/08/2021)   Hunger Vital Sign    Worried About Running Out of Food in the Last Year: Never true    Ran Out of Food in the Last Year: Never true  Transportation Needs: No Transportation Needs (12/08/2021)   PRAPARE - Administrator, Civil Service (Medical): No    Lack of Transportation (Non-Medical): No  Physical Activity: Inactive (12/08/2021)   Exercise Vital Sign    Days of Exercise per Week: 0 days    Minutes of Exercise per Session: 0 min  Stress: No Stress Concern Present (12/08/2021)   [employer] of Occupational Health - Occupational Stress Questionnaire    Feeling of Stress : Only a little  Social Connections: Unknown (09/08/2022)   Received from [facility]   Social Network    Social Network: Not on file  Intimate Partner Violence: Unknown (09/08/2022)   Received from [facility]   HITS    Physically Hurt: Not on file    Insult or Talk Down To: Not on file    Threaten Physical Harm: Not on file    Scream or Curse: Not on file    Family History: Family History  Problem Relation Age of Onset   Healthy Mother    Healthy Father    GI problems Brother    Diabetes Maternal Grandmother    Hypertension Maternal Grandmother    Heart disease Maternal Grandmother    Breast cancer Maternal Grandmother        unsure age of onset   Diabetes Paternal Grandmother    Heart disease Paternal Grandmother    Hypertension Paternal Grandmother    Diabetes Paternal Grandfather    Heart disease Paternal Grandfather    Hypertension Paternal Grandfather     Review of Systems: Review of Systems  Constitutional: Negative.   Respiratory: Negative.    Cardiovascular: Negative.   Gastrointestinal: Negative.   Neurological: Negative.     Physical Exam: Vital Signs BP  106/72 (BP Location: Left Arm, Patient Position: Sitting, Cuff Size: Large)   Pulse 65   Ht 5\' 5"  (1.651 m)   Wt 241 lb 12.8 oz (109.7 kg)   LMP 10/21/2022   SpO2 99%   BMI 40.24 kg/m   Physical Exam Constitutional:      General: Not in acute distress.    Appearance: Normal appearance. Not ill-appearing.  HENT:     Head: Normocephalic and atraumatic.  Eyes:     Pupils: Pupils  are equal, round Neck:     Musculoskeletal: Normal range of motion.  Cardiovascular:     Rate and Rhythm: Normal rate    Pulses: Normal pulses.  Pulmonary:     Effort: Pulmonary effort is normal. No respiratory distress.  Musculoskeletal: Normal range of motion.  Skin:    General: Skin is warm and dry.     Findings: No erythema or rash.  Neurological:     General: No focal deficit present.     Mental Status: Alert and oriented to person, place, and time. Mental status is at baseline.     Motor: No weakness.  Psychiatric:        Mood and Affect: Mood normal.        Behavior: Behavior normal.    Assessment/Plan: The patient is scheduled for bilateral breast reduction with Dr. Ladona Ridgel.  Risks, benefits, and alternatives of procedure discussed, questions answered and consent obtained.    Smoking Status: Non-smoker; Counseling Given?  N/A Last Mammogram: No history of mammogram due to age, not required  Caprini Score: 8; Risk Factors include: History of ulcerative colitis, mother with history of DVTs (provoked by SJS and prolonged hospitalization), BMI greater than 40, length of planned surgery. Recommendation for mechanical and possible pharmacological prophylaxis. Encourage early ambulation.   Pictures obtained: @consult   Post-op Rx sent to pharmacy: Oxycodone, Zofran  Patient was provided with the breast reduction and General Surgical Risk consent document and Pain Medication Agreement prior to their appointment.  They had adequate time to read through the risk consent documents and Pain Medication  Agreement. We also discussed them in person together during this preop appointment. All of their questions were answered to their satisfaction.  Recommended calling if they have any further questions.  Risk consent form and Pain Medication Agreement to be scanned into patient's chart.  The risk that can be encountered with breast reduction were discussed and include the following but not limited to these:  Breast asymmetry, fluid accumulation, firmness of the breast, inability to breast feed, loss of nipple or areola, skin loss, decrease or no nipple sensation, fat necrosis of the breast tissue, bleeding, infection, healing delay.  There are risks of anesthesia, changes to skin sensation and injury to nerves or blood vessels.  The muscle can be temporarily or permanently injured.  You may have an allergic reaction to tape, suture, glue, blood products which can result in skin discoloration, swelling, pain, skin lesions, poor healing.  Any of these can lead to the need for revisonal surgery or stage procedures.  A reduction has potential to interfere with diagnostic procedures.  Nipple or breast piercing can increase risks of infection.  This procedure is best done when the breast is fully developed.  Changes in the breast will continue to occur over time.  Pregnancy can alter the outcomes of previous breast reduction surgery, weight gain and weigh loss can also effect the long term appearance.    Electronically signed by: Kermit Balo Arney Mayabb, PA-C 10/27/2022 10:01 AM

## 2022-10-28 LAB — URINE CULTURE

## 2022-11-04 ENCOUNTER — Other Ambulatory Visit: Payer: Self-pay

## 2022-11-04 ENCOUNTER — Encounter (HOSPITAL_BASED_OUTPATIENT_CLINIC_OR_DEPARTMENT_OTHER): Payer: Self-pay | Admitting: Plastic Surgery

## 2022-11-04 NOTE — Progress Notes (Signed)
   11/04/22 1438  Pre-op Phone Call  Surgery Date Verified 11/11/22  Arrival Time Verified 0615  Surgery Location Verified [facility]  Medical History Reviewed Yes  Is the patient taking a GLP-1 receptor agonist? No  Does the patient have diabetes? No diagnosis of diabetes  Do you have a history of heart problems? No  Antiarrhythmic device type  (NA)  Does patient have other implanted devices? No  Patient Teaching Pre / Post Procedure  Patient educated about smoking cessation 24 hours prior to surgery. N/A Non-Smoker  Patient verbalizes understanding of bowel prep? N/A  Med Rec Completed Yes  Take the Following Meds the Morning of Surgery no meds AM of sx  Recent  Lab Work, EKG, CXR? No  NPO (Including gum & candy) After midnight  Stop Solids, Milk, Candy, and Gum STARTING AT MIDNIGHT  Responsible adult to drive and be with you for 24 hours? 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  No lotions, powders, perfumes. No shaving  48 hrs. prior to surgery. Yes  Contacts, Dentures & Glasses Will Have to be Removed Before OR. Yes  Please bring your ID and Insurance Card the morning of your surgery. (Surgery Centers Only) Yes  Bring any papers or x-rays with you that your surgeon gave you. Yes  Instructed to contact the location of procedure/ provider if they or anyone in their household develops symptoms or tests positive for COVID-19, has close contact with someone who tests positive for COVID, or has known exposure to any contagious illness. Yes  Call this number the morning of surgery  with any problems that may cancel your surgery. [phone]  Covid-19 Assessment  Have you had a positive COVID-19 test within the previous 90 days? No  COVID Testing Guidance Proceed with the additional questions.  Patient's surgery required a COVID-19 test (cardiothoracic, complex ENT, and bronchoscopies/ EBUS) No  Have you been  unmasked and in close contact with anyone with COVID-19 or COVID-19 symptoms within the past 10 days? No  Do you or anyone in your household currently have any COVID-19 symptoms? No

## 2022-11-11 ENCOUNTER — Ambulatory Visit (HOSPITAL_BASED_OUTPATIENT_CLINIC_OR_DEPARTMENT_OTHER): Payer: Managed Care, Other (non HMO) | Admitting: Anesthesiology

## 2022-11-11 ENCOUNTER — Encounter (HOSPITAL_BASED_OUTPATIENT_CLINIC_OR_DEPARTMENT_OTHER)
Admission: RE | Disposition: A | Discharge status: Home / Self Care | Payer: Self-pay | Source: Home / Self Care | Attending: Plastic Surgery

## 2022-11-11 ENCOUNTER — Other Ambulatory Visit: Payer: Self-pay

## 2022-11-11 ENCOUNTER — Ambulatory Visit (HOSPITAL_BASED_OUTPATIENT_CLINIC_OR_DEPARTMENT_OTHER)
Admission: RE | Admit: 2022-11-11 | Discharge: 2022-11-11 | Disposition: A | Discharge status: Home / Self Care | Payer: Managed Care, Other (non HMO) | Attending: Plastic Surgery | Admitting: Plastic Surgery

## 2022-11-11 ENCOUNTER — Encounter (HOSPITAL_BASED_OUTPATIENT_CLINIC_OR_DEPARTMENT_OTHER): Payer: Self-pay | Admitting: Plastic Surgery

## 2022-11-11 DIAGNOSIS — M549 Dorsalgia, unspecified: Secondary | ICD-10-CM | POA: Diagnosis not present

## 2022-11-11 DIAGNOSIS — I10 Essential (primary) hypertension: Secondary | ICD-10-CM | POA: Diagnosis not present

## 2022-11-11 DIAGNOSIS — Z419 Encounter for procedure for purposes other than remedying health state, unspecified: Secondary | ICD-10-CM

## 2022-11-11 DIAGNOSIS — M542 Cervicalgia: Secondary | ICD-10-CM | POA: Diagnosis not present

## 2022-11-11 DIAGNOSIS — N62 Hypertrophy of breast: Secondary | ICD-10-CM | POA: Insufficient documentation

## 2022-11-11 DIAGNOSIS — K5190 Ulcerative colitis, unspecified, without complications: Secondary | ICD-10-CM | POA: Insufficient documentation

## 2022-11-11 DIAGNOSIS — Z6841 Body Mass Index (BMI) 40.0 and over, adult: Secondary | ICD-10-CM | POA: Diagnosis not present

## 2022-11-11 HISTORY — PX: BREAST REDUCTION SURGERY: SHX8

## 2022-11-11 LAB — POCT PREGNANCY, URINE: Preg Test, Ur: NEGATIVE

## 2022-11-11 SURGERY — MAMMOPLASTY, REDUCTION
Anesthesia: General | Site: Breast | Laterality: Bilateral

## 2022-11-11 MED ORDER — ONDANSETRON HCL 4 MG/2ML IJ SOLN: 4.0000 mg | Freq: Once | INTRAMUSCULAR | Status: DC | PRN

## 2022-11-11 MED ORDER — CHLORHEXIDINE GLUCONATE CLOTH 2 % EX PADS: 6.0000 | MEDICATED_PAD | Freq: Once | CUTANEOUS | Status: DC

## 2022-11-11 MED ORDER — DEXMEDETOMIDINE HCL IN NACL 80 MCG/20ML IV SOLN
INTRAVENOUS | Status: DC | PRN
Start: 2022-11-11 — End: 2022-11-11
  Administered 2022-11-11 (×3): 4 ug via INTRAVENOUS
  Administered 2022-11-11: 8 ug via INTRAVENOUS

## 2022-11-11 MED ORDER — HYDROMORPHONE HCL 1 MG/ML IJ SOLN
INTRAMUSCULAR | Status: AC
  Filled 2022-11-11: qty 0.5

## 2022-11-11 MED ORDER — LIDOCAINE 2% (20 MG/ML) 5 ML SYRINGE
INTRAMUSCULAR | Status: AC
  Filled 2022-11-11: qty 5

## 2022-11-11 MED ORDER — ROCURONIUM BROMIDE 10 MG/ML (PF) SYRINGE
PREFILLED_SYRINGE | INTRAVENOUS | Status: DC | PRN
  Administered 2022-11-11: 30 mg via INTRAVENOUS
  Administered 2022-11-11: 70 mg via INTRAVENOUS

## 2022-11-11 MED ORDER — PHENYLEPHRINE 80 MCG/ML (10ML) SYRINGE FOR IV PUSH (FOR BLOOD PRESSURE SUPPORT)
PREFILLED_SYRINGE | INTRAVENOUS | Status: AC
  Filled 2022-11-11: qty 10

## 2022-11-11 MED ORDER — PROPOFOL 10 MG/ML IV BOLUS
INTRAVENOUS | Status: DC | PRN
  Administered 2022-11-11: 200 mg via INTRAVENOUS

## 2022-11-11 MED ORDER — PHENYLEPHRINE 80 MCG/ML (10ML) SYRINGE FOR IV PUSH (FOR BLOOD PRESSURE SUPPORT)
PREFILLED_SYRINGE | INTRAVENOUS | Status: DC | PRN
Start: 2022-11-11 — End: 2022-11-11
  Administered 2022-11-11: 160 ug via INTRAVENOUS

## 2022-11-11 MED ORDER — ROCURONIUM BROMIDE 10 MG/ML (PF) SYRINGE
PREFILLED_SYRINGE | INTRAVENOUS | Status: AC
  Filled 2022-11-11: qty 10

## 2022-11-11 MED ORDER — HYDROMORPHONE HCL 1 MG/ML IJ SOLN
0.2500 mg | INTRAMUSCULAR | Status: DC | PRN
  Administered 2022-11-11 (×2): 0.5 mg via INTRAVENOUS

## 2022-11-11 MED ORDER — SUGAMMADEX SODIUM 200 MG/2ML IV SOLN
INTRAVENOUS | Status: DC | PRN
  Administered 2022-11-11: 200 mg via INTRAVENOUS
  Administered 2022-11-11: 100 mg via INTRAVENOUS

## 2022-11-11 MED ORDER — MIDAZOLAM HCL 2 MG/2ML IJ SOLN
INTRAMUSCULAR | Status: AC
  Filled 2022-11-11: qty 2

## 2022-11-11 MED ORDER — KETAMINE HCL 10 MG/ML IJ SOLN
INTRAMUSCULAR | Status: DC | PRN
Start: 2022-11-11 — End: 2022-11-11
  Administered 2022-11-11: 50 mg via INTRAVENOUS

## 2022-11-11 MED ORDER — BUPIVACAINE HCL (PF) 0.25 % IJ SOLN
INTRAMUSCULAR | Status: DC | PRN
  Administered 2022-11-11 (×2): 10 mL

## 2022-11-11 MED ORDER — DROPERIDOL 2.5 MG/ML IJ SOLN
INTRAMUSCULAR | Status: DC | PRN
Start: 2022-11-11 — End: 2022-11-11
  Administered 2022-11-11: 1.25 mg via INTRAVENOUS

## 2022-11-11 MED ORDER — KETAMINE HCL 50 MG/5ML IJ SOSY
PREFILLED_SYRINGE | INTRAMUSCULAR | Status: AC
  Filled 2022-11-11: qty 5

## 2022-11-11 MED ORDER — OXYCODONE HCL 5 MG/5ML PO SOLN: 5.0000 mg | Freq: Once | ORAL | Status: AC | PRN

## 2022-11-11 MED ORDER — CEFAZOLIN SODIUM-DEXTROSE 2-4 GM/100ML-% IV SOLN
INTRAVENOUS | Status: AC
  Filled 2022-11-11: qty 100

## 2022-11-11 MED ORDER — LACTATED RINGERS IV SOLN: INTRAVENOUS | Status: DC

## 2022-11-11 MED ORDER — LIDOCAINE 2% (20 MG/ML) 5 ML SYRINGE
INTRAMUSCULAR | Status: DC | PRN
  Administered 2022-11-11: 100 mg via INTRAVENOUS

## 2022-11-11 MED ORDER — ONDANSETRON HCL 4 MG/2ML IJ SOLN
INTRAMUSCULAR | Status: DC | PRN
  Administered 2022-11-11: 4 mg via INTRAVENOUS

## 2022-11-11 MED ORDER — OXYCODONE HCL 5 MG PO TABS
ORAL_TABLET | ORAL | Status: AC
  Filled 2022-11-11: qty 1

## 2022-11-11 MED ORDER — MIDAZOLAM HCL 5 MG/5ML IJ SOLN
INTRAMUSCULAR | Status: DC | PRN
  Administered 2022-11-11: 2 mg via INTRAVENOUS

## 2022-11-11 MED ORDER — FENTANYL CITRATE (PF) 100 MCG/2ML IJ SOLN
INTRAMUSCULAR | Status: AC
  Filled 2022-11-11: qty 2

## 2022-11-11 MED ORDER — ONDANSETRON HCL 4 MG/2ML IJ SOLN
INTRAMUSCULAR | Status: AC
  Filled 2022-11-11: qty 2

## 2022-11-11 MED ORDER — PROPOFOL 10 MG/ML IV BOLUS
INTRAVENOUS | Status: AC
  Filled 2022-11-11: qty 20

## 2022-11-11 MED ORDER — CEFAZOLIN SODIUM-DEXTROSE 2-4 GM/100ML-% IV SOLN: 2.0000 g | INTRAVENOUS | Status: DC

## 2022-11-11 MED ORDER — 0.9 % SODIUM CHLORIDE (POUR BTL) OPTIME
TOPICAL | Status: DC | PRN
  Administered 2022-11-11: 1000 mL

## 2022-11-11 MED ORDER — FENTANYL CITRATE (PF) 100 MCG/2ML IJ SOLN
INTRAMUSCULAR | Status: DC | PRN
  Administered 2022-11-11: 50 ug via INTRAVENOUS
  Administered 2022-11-11: 100 ug via INTRAVENOUS
  Administered 2022-11-11: 50 ug via INTRAVENOUS

## 2022-11-11 MED ORDER — DEXAMETHASONE SODIUM PHOSPHATE 10 MG/ML IJ SOLN
INTRAMUSCULAR | Status: DC | PRN
  Administered 2022-11-11: 10 mg via INTRAVENOUS

## 2022-11-11 MED ORDER — HYDROMORPHONE HCL 1 MG/ML IJ SOLN
INTRAMUSCULAR | Status: DC | PRN
Start: 2022-11-11 — End: 2022-11-11
  Administered 2022-11-11 (×2): .5 mg via INTRAVENOUS

## 2022-11-11 MED ORDER — CEFAZOLIN SODIUM-DEXTROSE 2-3 GM-%(50ML) IV SOLR
INTRAVENOUS | Status: DC | PRN
  Administered 2022-11-11: 2 g via INTRAVENOUS

## 2022-11-11 MED ORDER — OXYCODONE HCL 5 MG PO TABS
5.0000 mg | ORAL_TABLET | Freq: Once | ORAL | Status: AC | PRN
  Administered 2022-11-11: 5 mg via ORAL

## 2022-11-11 MED ORDER — DEXAMETHASONE SODIUM PHOSPHATE 10 MG/ML IJ SOLN
INTRAMUSCULAR | Status: AC
  Filled 2022-11-11: qty 1

## 2022-11-11 SURGICAL SUPPLY — 57 items
ADH SKN CLS APL DERMABOND .7 (GAUZE/BANDAGES/DRESSINGS) ×2
BINDER BREAST 3XL (GAUZE/BANDAGES/DRESSINGS) IMPLANT
BINDER BREAST LRG (GAUZE/BANDAGES/DRESSINGS) IMPLANT
BINDER BREAST MEDIUM (GAUZE/BANDAGES/DRESSINGS) IMPLANT
BINDER BREAST XLRG (GAUZE/BANDAGES/DRESSINGS) IMPLANT
BINDER BREAST XXLRG (GAUZE/BANDAGES/DRESSINGS) IMPLANT
BIOPATCH RED 1 DISK 7.0 (GAUZE/BANDAGES/DRESSINGS) ×4 IMPLANT
BLADE SURG 10 STRL SS (BLADE) ×8 IMPLANT
BLADE SURG 15 STRL LF DISP TIS (BLADE) ×2 IMPLANT
BLADE SURG 15 STRL SS (BLADE) ×1
CANISTER SUCT 1200ML W/VALVE (MISCELLANEOUS) ×2 IMPLANT
DERMABOND ADVANCED .7 DNX12 (GAUZE/BANDAGES/DRESSINGS) ×4 IMPLANT
DRAIN CHANNEL 19F RND (DRAIN) ×4 IMPLANT
DRAPE IMP U-DRAPE 54X76 (DRAPES) IMPLANT
DRAPE UTILITY XL STRL (DRAPES) ×2 IMPLANT
DRSG TEGADERM 4X4.75 (GAUZE/BANDAGES/DRESSINGS) ×4 IMPLANT
ELECT BLADE 4.0 EZ CLEAN MEGAD (MISCELLANEOUS) ×1
ELECT REM PT RETURN 9FT ADLT (ELECTROSURGICAL) ×2
ELECTRODE BLDE 4.0 EZ CLN MEGD (MISCELLANEOUS) ×2 IMPLANT
ELECTRODE REM PT RTRN 9FT ADLT (ELECTROSURGICAL) ×4 IMPLANT
EVACUATOR SILICONE 100CC (DRAIN) ×4 IMPLANT
GAUZE PAD ABD 8X10 STRL (GAUZE/BANDAGES/DRESSINGS) ×4 IMPLANT
GAUZE SPONGE 2X2 STRL 8-PLY (GAUZE/BANDAGES/DRESSINGS) ×4 IMPLANT
GLOVE BIO SURGEON STRL SZ 6.5 (GLOVE) IMPLANT
GLOVE BIO SURGEON STRL SZ7.5 (GLOVE) ×2 IMPLANT
GLOVE BIO SURGEON STRL SZ8 (GLOVE) ×2 IMPLANT
GLOVE BIOGEL PI IND STRL 7.0 (GLOVE) IMPLANT
GLOVE BIOGEL PI IND STRL 8 (GLOVE) ×2 IMPLANT
GOWN STRL REUS W/ TWL LRG LVL3 (GOWN DISPOSABLE) IMPLANT
GOWN STRL REUS W/TWL LRG LVL3 (GOWN DISPOSABLE)
GOWN STRL REUS W/TWL XL LVL3 (GOWN DISPOSABLE) ×2 IMPLANT
HEMOSTAT ARISTA ABSORB 3G PWDR (HEMOSTASIS) IMPLANT
HIBICLENS CHG 4% 4OZ BTL (MISCELLANEOUS) ×2 IMPLANT
MARKER SKIN DUAL TIP RULER LAB (MISCELLANEOUS) ×2 IMPLANT
NDL HYPO 25X1 1.5 SAFETY (NEEDLE) IMPLANT
NEEDLE HYPO 25X1 1.5 SAFETY (NEEDLE)
NS IRRIG 1000ML POUR BTL (IV SOLUTION) ×2 IMPLANT
PACK BASIN DAY SURGERY FS (CUSTOM PROCEDURE TRAY) ×2 IMPLANT
PACK UNIVERSAL I (CUSTOM PROCEDURE TRAY) ×2 IMPLANT
PENCIL SMOKE EVACUATOR (MISCELLANEOUS) ×4 IMPLANT
PIN SAFETY STERILE (MISCELLANEOUS) IMPLANT
SLEEVE SCD COMPRESS KNEE MED (STOCKING) ×2 IMPLANT
SPONGE T-LAP 18X18 ~~LOC~~+RFID (SPONGE) ×6 IMPLANT
STAPLER VISISTAT 35W (STAPLE) ×2 IMPLANT
SUT MNCRL AB 3-0 PS2 27 (SUTURE) ×8 IMPLANT
SUT MNCRL AB 4-0 PS2 18 (SUTURE) ×8 IMPLANT
SUT MON AB 2-0 CT1 36 (SUTURE) ×2 IMPLANT
SUT MON AB 5-0 PS2 18 (SUTURE) IMPLANT
SUT SILK 2 0 SH (SUTURE) ×4 IMPLANT
SUT VIC AB 3-0 SH 27 (SUTURE)
SUT VIC AB 3-0 SH 27X BRD (SUTURE) IMPLANT
SYR BULB IRRIG 60ML STRL (SYRINGE) ×2 IMPLANT
TOWEL GREEN STERILE FF (TOWEL DISPOSABLE) ×4 IMPLANT
TRAY DSU PREP LF (CUSTOM PROCEDURE TRAY) ×2 IMPLANT
TUBE CONNECTING 20X1/4 (TUBING) ×2 IMPLANT
UNDERPAD 30X36 HEAVY ABSORB (UNDERPADS AND DIAPERS) ×4 IMPLANT
YANKAUER SUCT BULB TIP NO VENT (SUCTIONS) ×2 IMPLANT

## 2022-11-11 NOTE — Anesthesia Preprocedure Evaluation (Addendum)
Anesthesia Evaluation  Patient identified by MRN, date of birth, ID band Patient awake    Reviewed: Allergy & Precautions, H&P , NPO status , Patient's Chart, lab work & pertinent test results  Airway Mallampati: II  TM Distance: <3 FB Neck ROM: Full    Dental no notable dental hx.    Pulmonary neg pulmonary ROS   Pulmonary exam normal breath sounds clear to auscultation       Cardiovascular hypertension, Normal cardiovascular exam Rhythm:Regular Rate:Normal     Neuro/Psych negative neurological ROS  negative psych ROS   GI/Hepatic negative GI ROS, Neg liver ROS,,,  Endo/Other    Morbid obesity  Renal/GU negative Renal ROS  negative genitourinary   Musculoskeletal negative musculoskeletal ROS (+)    Abdominal   Peds negative pediatric ROS (+)  Hematology  (+) Blood dyscrasia Ladd trait   Anesthesia Other Findings   Reproductive/Obstetrics negative OB ROS                             Anesthesia Physical Anesthesia Plan  ASA: 3  Anesthesia Plan: General   Post-op Pain Management: Tylenol PO (pre-op)*   Induction: Intravenous  PONV Risk Score and Plan: 3 and Ondansetron, Dexamethasone, Midazolam, Treatment may vary due to age or medical condition and Droperidol  Airway Management Planned: Oral ETT  Additional Equipment:   Intra-op Plan:   Post-operative Plan: Extubation in OR  Informed Consent: I have reviewed the patients History and Physical, chart, labs and discussed the procedure including the risks, benefits and alternatives for the proposed anesthesia with the patient or authorized representative who has indicated his/her understanding and acceptance.     Dental advisory given  Plan Discussed with: CRNA and Surgeon  Anesthesia Plan Comments:        Anesthesia Quick Evaluation

## 2022-11-11 NOTE — Transfer of Care (Signed)
Immediate Anesthesia Transfer of Care Note  Patient: [employer]  Procedure(s) Performed: MAMMARY REDUCTION  (BREAST) (Bilateral: Breast)  Patient Location: PACU  Anesthesia Type:General  Level of Consciousness: awake and alert   Airway & Oxygen Therapy: Patient Spontanous Breathing and Patient connected to face mask oxygen  Post-op Assessment: Report given to RN and Post -op Vital signs reviewed and stable  Post vital signs: Reviewed and stable  Last Vitals:  Vitals Value Taken Time  BP 111/62 11/11/22 1030  Temp    Pulse 73 11/11/22 1030  Resp 15 11/11/22 1030  SpO2 100 % 11/11/22 1030  Vitals shown include unfiled device data.  Last Pain:  Vitals:   11/11/22 0628  TempSrc: Oral  PainSc: 0-No pain      Patients Stated Pain Goal: 6 (11/11/22 8469)  Complications: No notable events documented.

## 2022-11-11 NOTE — Discharge Instructions (Addendum)
Activity: Avoid strenuous activity.  No lifting, pushing, or pulling greater than 15 pounds.  Diet: No restrictions.  Try to optimize nutrition with plenty of proteins, fruits, and vegetables to improve healing.   Wound Care: Leave breast binder on for the first week and then you may transition to a front-clipping or front-zipping compression bra.  Sponge bathe only until our visit tomorrow, then we can discuss transition to showering with the emphasis on keeping the surgical site dry.  Replace the ABD pads over incisions with gauze or Maxi pads as needed for incisional drainage.   If you have drains placed, be sure to record the daily output from each side. They will be removed at your appointment Monday. Please make sure that the bulbs are charged.  If you experience any issues, please call the office.    Follow-Up: Scheduled for Monday.  Things to watch for:  Call the office if you experience fever, chills, intractable vomiting, or significant bleeding.  Mild wound drainage is common after breast reduction surgery and should not be cause for alarm.   Post Anesthesia Home Care Instructions  Activity: Get plenty of rest for the remainder of the day. A responsible individual must stay with you for 24 hours following the procedure.  For the next 24 hours, DO NOT: -Drive a car -Advertising copywriter -Drink alcoholic beverages -Take any medication unless instructed by your physician -Make any legal decisions or sign important papers.  Meals: Start with liquid foods such as gelatin or soup. Progress to regular foods as tolerated. Avoid greasy, spicy, heavy foods. If nausea and/or vomiting occur, drink only clear liquids until the nausea and/or vomiting subsides. Call your physician if vomiting continues.  Special Instructions/Symptoms: Your throat may feel dry or sore from the anesthesia or the breathing tube placed in your throat during surgery. If this causes discomfort, gargle with warm salt  water. The discomfort should disappear within 24 hours.  If you had a scopolamine patch placed behind your ear for the management of post- operative nausea and/or vomiting:  1. The medication in the patch is effective for 72 hours, after which it should be removed.  Wrap patch in a tissue and discard in the trash. Wash hands thoroughly with soap and water. 2. You may remove the patch earlier than 72 hours if you experience unpleasant side effects which may include dry mouth, dizziness or visual disturbances. 3. Avoid touching the patch. Wash your hands with soap and water after contact with the patch.   Information for Discharge Teaching: EXPAREL (bupivacaine liposome injectable suspension)   Pain relief is important to your recovery. The goal is to control your pain so you can move easier and return to your normal activities as soon as possible after your procedure. Your physician may use several types of medicines to manage pain, swelling, and more.  Your surgeon or anesthesiologist gave you EXPAREL(bupivacaine) to help control your pain after surgery.  EXPAREL is a local anesthetic designed to release slowly over an extended period of time to provide pain relief by numbing the tissue around the surgical site. EXPAREL is designed to release pain medication over time and can control pain for up to 72 hours. Depending on how you respond to EXPAREL, you may require less pain medication during your recovery. EXPAREL can help reduce or eliminate the need for opioids during the first few days after surgery when pain relief is needed the most. EXPAREL is not an opioid and is not addictive. It does  not cause sleepiness or sedation.   Important! A teal colored band has been placed on your arm with the date, time and amount of EXPAREL you have received. Please leave this armband in place for the full 96 hours following administration, and then you may remove the band. If you return to the hospital for  any reason within 96 hours following the administration of EXPAREL, the armband provides important information that your health care providers to know, and alerts them that you have received this anesthetic.    Possible side effects of EXPAREL: Temporary loss of sensation or ability to move in the area where medication was injected. Nausea, vomiting, constipation Rarely, numbness and tingling in your mouth or lips, lightheadedness, or anxiety may occur. Call your doctor right away if you think you may be experiencing any of these sensations, or if you have other questions regarding possible side effects.  Follow all other discharge instructions given to you by your surgeon or nurse. Eat a healthy diet and drink plenty of water or other fluids.    JP Drain [employer] this sheet to all of your post-operative appointments while you have your drains. Please measure your drains by CC's or ML's. Make sure you drain and measure your JP Drains 2 or 3 times per day. At the end of each day, add up totals for the left side and add up totals for the right side.    ( 9 am )     ( 3 pm )        ( 9 pm )                Date L  R  L  R  L  R  Total L/R                                                                                                                                                                                       You had 5mg  of Oxycodone at 11:35pm.

## 2022-11-11 NOTE — Anesthesia Procedure Notes (Signed)
Procedure Name: Intubation Date/Time: 11/11/2022 7:42 AM  Performed by: Alvera Novel, CRNAPre-anesthesia Checklist: Patient identified, Emergency Drugs available, Suction available and Patient being monitored Patient Re-evaluated:Patient Re-evaluated prior to induction Oxygen Delivery Method: Circle System Utilized Preoxygenation: Pre-oxygenation with 100% oxygen Induction Type: IV induction Ventilation: Mask ventilation without difficulty Laryngoscope Size: Mac and 4 Grade View: Grade I Tube type: Oral Tube size: 7.0 mm Number of attempts: 1 Airway Equipment and Method: Stylet Placement Confirmation: ETT inserted through vocal cords under direct vision, positive ETCO2 and breath sounds checked- equal and bilateral Secured at: 22 cm Tube secured with: Tape Dental Injury: Teeth and Oropharynx as per pre-operative assessment

## 2022-11-11 NOTE — Interval H&P Note (Signed)
History and Physical Interval Note: No change in exam or indication for surgery. Marked for a bilateral breast reduction with her assistance. All questions answered to her satisfaction. Will proceed at her request   11/11/2022 7:05 AM  Kathleen Hamilton  has presented today for surgery, with the diagnosis of macromastia.  The various methods of treatment have been discussed with the patient and family. After consideration of risks, benefits and other options for treatment, the patient has consented to  Procedure(s) with comments: MAMMARY REDUCTION  (BREAST) (Bilateral) - 3 hours as a surgical intervention.  The patient's history has been reviewed, patient examined, no change in status, stable for surgery.  I have reviewed the patient's chart and labs.  Questions were answered to the patient's satisfaction.     [name]

## 2022-11-11 NOTE — Op Note (Signed)
DATE OF OPERATION: 11/11/2022  LOCATION: [facility] operating Room  PREOPERATIVE DIAGNOSIS: Symptomatic macromastia  POSTOPERATIVE DIAGNOSIS: Same  PROCEDURE: Bilateral breast reduction  SURGEON: Loren Racer, MD  ASSISTANT: Evelena Leyden primary, Matt Scheeler  EBL: 100 cc  CONDITION: Stable  COMPLICATIONS: None  INDICATION: The patient, Kathleen Hamilton, is a 27 y.o. female born on June 11, 1995, is here for treatment of upper back and neck pain secondary to large size of breasts.   PROCEDURE DETAILS:  The patient was seen prior to surgery and marked.   IV antibiotics were given. The patient was taken to the operating room and given a general anesthetic. A standard time out was performed and all information was confirmed by those in the room. SCDs were placed.   The chest was prepped and draped in usual sterile manner.  A 42 mm cookie cutter was used to mark both areolar complexes.  An 8 cm based inferior pedicle was marked on the breast.  Right breast was addressed first.  A laparotomy tape was placed at the base of the breast and the pedicle was de-epithelialized sharply.  Electrocautery was used to dissected the borders of the pedicle to the chest wall.  The electrocautery was then used to resect the medial lateral and superior triangles of breast tissue and to develop and thin the superior skin flap.  The tissue removed constituted the bulk of the reduction.  This tissue weighed 864 g and was sent to pathology for routine examination.  Hemostasis was achieved with the electrocautery.  The wound was irrigated with warm normal saline.  The subcu pectoral fascial region and subcutaneous tissues were infiltrated with Exparel mixed with quarter percent plain Marcaine.  A 19 French round drain was placed behind the pedicle and brought out through a separate stab incision.  The T point was approximated with a single interrupted 2-0 Monocryl suture and the skin edges were tailor tacked in  place with skin clips.  The dermis was approximated with interrupted and running 3-0 Monocryl sutures and the skin was closed with a running 4-0 Monocryl subcuticular stitch.  Attention was turned to the left breast where similar procedure was performed.  The pedicle was de-epithelialized and dissected to the chest wall  The medial lateral and superior triangles of breast tissue were resected and the superior skin flap was developed and thinned.  The total breast tissue removed on the left side was 800 g and this tissue was sent to pathology for routine examination.  Again the wound was inspected for bleeding and hemostasis achieved with electrocautery.  The wound was irrigated and the subcu fascial region and subcutaneous tissues infiltrated with Exparel.  19 French round drain was placed behind the pedicle and brought out through a separate stab incision.  The T point was approximated with a 2-0 Monocryl suture and the skin edges tailor tacked in place.  The dermis was closed with interrupted and running 3-0 Monocryl suture and the skin was closed with a running 4-0 Monocryl subcuticular stitch.  Dermabond was placed on all incisions and the patient was placed in a supportive garment.  She was awakened from anesthesia without incident transferred to the recovery room in good condition.  All instrument needle and sponge counts were reported as correct and there were no complications. The advanced practice practitioner (APP) assisted throughout the case.  The APP was essential in retraction and counter traction when needed to make the case progress smoothly.  This retraction and assistance made it possible  to see the tissue plans for the procedure.  The assistance was needed for blood control, tissue re-approximation and assisted with closure of the incision site.

## 2022-11-11 NOTE — Anesthesia Postprocedure Evaluation (Signed)
Anesthesia Post Note  Patient: [employer]  Procedure(s) Performed: MAMMARY REDUCTION  (BREAST) (Bilateral: Breast)     Patient location during evaluation: PACU Anesthesia Type: General Level of consciousness: awake and alert Pain management: pain level controlled Vital Signs Assessment: post-procedure vital signs reviewed and stable Respiratory status: spontaneous breathing, nonlabored ventilation, respiratory function stable and patient connected to nasal cannula oxygen Cardiovascular status: blood pressure returned to baseline and stable Postop Assessment: no apparent nausea or vomiting Anesthetic complications: no  No notable events documented.  Last Vitals:  Vitals:   11/11/22 1115 11/11/22 1141  BP: 95/76 120/81  Pulse: 67 90  Resp: 14 16  Temp:  (!) 36.2 C  SpO2: 94% 97%    Last Pain:  Vitals:   11/11/22 1131  TempSrc:   PainSc: 5                  Gage Treiber S

## 2022-11-14 ENCOUNTER — Encounter (HOSPITAL_BASED_OUTPATIENT_CLINIC_OR_DEPARTMENT_OTHER): Payer: Self-pay | Admitting: Plastic Surgery

## 2022-11-14 ENCOUNTER — Ambulatory Visit (INDEPENDENT_AMBULATORY_CARE_PROVIDER_SITE_OTHER): Payer: Managed Care, Other (non HMO) | Admitting: Plastic Surgery

## 2022-11-14 VITALS — BP 128/80 | HR 78

## 2022-11-14 DIAGNOSIS — Z9889 Other specified postprocedural states: Secondary | ICD-10-CM

## 2022-11-14 LAB — SURGICAL PATHOLOGY

## 2022-11-14 NOTE — Progress Notes (Signed)
Kathleen Hamilton returns today 3 days postop from a bilateral breast reduction.  She is doing well.  She notes discomfort along the left chest wall.  She has taken narcotic pain medicine just at night and feels that she can stop use of her narcotics today.  On examination the breasts are soft with minimal tenderness.  Nipples are well-perfused and she has feeling both.  The incisions are clean dry and intact.  JP output is thin and serosanguineous.  Drains were removed today without difficulty.  She is encouraged to slowly increase her activity as tolerated while refraining from lifting greater than 20 pounds and submerging the incisions underwater.   Keep scheduled appointment in 1 week.

## 2022-11-21 ENCOUNTER — Ambulatory Visit (INDEPENDENT_AMBULATORY_CARE_PROVIDER_SITE_OTHER): Payer: Managed Care, Other (non HMO) | Admitting: Surgical

## 2022-11-21 ENCOUNTER — Encounter: Payer: Self-pay | Admitting: Surgical

## 2022-11-21 VITALS — BP 122/87 | HR 77

## 2022-11-21 DIAGNOSIS — Z9889 Other specified postprocedural states: Secondary | ICD-10-CM

## 2022-11-21 DIAGNOSIS — N62 Hypertrophy of breast: Secondary | ICD-10-CM

## 2022-11-21 NOTE — Progress Notes (Signed)
Patient is a  27 y.o.-year-old female status post bilateral breast reduction with Dr.  Ladona Ridgel. Patient is 10 days postop.  She reports she is overall doing well.  She is not having any infectious symptoms.  She reports bowel movements have returned to normal and she is urinating normally.  She has no specific concerns at this time.  She does have questions about exercise restrictions  Chaperone present on exam Bilateral NAC's are viable, bilateral breast incisions are intact. There is no erythema or cellulitic changes noted. No subcutaneous fluid collections noted with palpation. There is some ecchymosis that is beginning to resolve.   A/P:  Recommend continuing with compressive garment 24/7 until 6 weeks post-op,  avoiding strenuous activity/heavy lifting until 6 weeks post-op  Recommend following up in 2 to 3 weeks All the patient's questions were answered to their content. Recommend calling with any questions or concerns.  Pictures were obtained of the patient and placed in the chart with the patient's or guardian's permission.

## 2022-12-01 ENCOUNTER — Encounter: Payer: Managed Care, Other (non HMO) | Admitting: Surgical

## 2022-12-05 ENCOUNTER — Ambulatory Visit (INDEPENDENT_AMBULATORY_CARE_PROVIDER_SITE_OTHER): Payer: Managed Care, Other (non HMO) | Admitting: Surgical

## 2022-12-05 DIAGNOSIS — Z9889 Other specified postprocedural states: Secondary | ICD-10-CM

## 2022-12-05 NOTE — Progress Notes (Signed)
Patient is a  27 y.o.-year-old female status post bilateral breast reduction with Dr.  Ladona Ridgel. Patient is 3 weeks postop.   Chaperone present on exam Bilateral NAC's are viable, bilateral breast incisions are intact. She does have 2 small T-junction wounds of bilateral breasts and a wound just inferior to the left NAC.  These are all superficial and approximately 2 x 2 mm each. There is no erythema or cellulitic changes noted. No subcutaneous fluid collections noted with palpation.   A/P:  Recommend continuing with compressive garment 24/7 until 6 weeks post-op,  avoiding strenuous activity/heavy lifting until 6 weeks post-op  Recommend Vaseline and gauze to breast wounds daily.  There is no signs of infection or concern on exam.  All the patient's questions were answered to their content. Recommend calling with any questions or concerns.

## 2022-12-08 DIAGNOSIS — Z719 Counseling, unspecified: Secondary | ICD-10-CM

## 2022-12-15 ENCOUNTER — Encounter: Payer: Managed Care, Other (non HMO) | Admitting: Plastic Surgery

## 2022-12-15 ENCOUNTER — Ambulatory Visit (INDEPENDENT_AMBULATORY_CARE_PROVIDER_SITE_OTHER): Payer: Managed Care, Other (non HMO) | Admitting: Plastic Surgery

## 2022-12-15 DIAGNOSIS — Z9889 Other specified postprocedural states: Secondary | ICD-10-CM

## 2022-12-15 NOTE — Progress Notes (Signed)
Ms. Cahan presents today for evaluation of a breast reduction done on September 27.  She is doing well and overall very pleased with the results of her reduction.  She has noted significant improvement in the pain in her upper back and neck.  She does also note that she has 3 small areas where the incisions are still healing after the incisions separated.  No significant pain.  No fever or chills.  On physical exam she has an outstanding size and shape.  As noted there are 3 areas of 5 mm or less openings on the breast 2 on the left at the nipple and areolar complex and the T point and 1 on the right breast at the T point.  All of these areas appear to be healing.  Status post bilateral breast reduction: Patient is doing very well I have not encouraged her to start increasing her activity though I would prefer she did not submerge the incisions in water until the openings are completely healed.  She will continue Vaseline and dressings over the areas of opening and will begin scar massage.  Follow-up in 1 month.

## 2023-01-10 NOTE — Progress Notes (Deleted)
Patient is a  27 y.o.-year-old female status post bilateral breast reduction with Dr.  Ladona Ridgel on 11/11/2022.  Patient is 2 months postop.  Chaperone present on exam ***Bilateral NAC's are viable, bilateral breast incisions are intact. There is no erythema or cellulitic changes noted. No subcutaneous fluid collections noted with palpation.   Recommend continuing with compressive garment 24/7 until 6 weeks post-op,  avoiding strenuous activity/heavy lifting until 6 weeks post-op  Recommend following up in *** All the patient's questions were answered to their content. Recommend calling with any questions or concerns.

## 2023-01-11 ENCOUNTER — Encounter: Payer: Managed Care, Other (non HMO) | Admitting: Surgical

## 2023-01-11 DIAGNOSIS — N62 Hypertrophy of breast: Secondary | ICD-10-CM

## 2023-01-11 DIAGNOSIS — Z9889 Other specified postprocedural states: Secondary | ICD-10-CM

## 2023-01-16 ENCOUNTER — Ambulatory Visit (INDEPENDENT_AMBULATORY_CARE_PROVIDER_SITE_OTHER): Payer: Managed Care, Other (non HMO) | Admitting: Surgical

## 2023-01-16 DIAGNOSIS — Z9889 Other specified postprocedural states: Secondary | ICD-10-CM

## 2023-01-16 NOTE — Progress Notes (Signed)
Patient is a  27 y.o.-year-old female status post bilateral breast reduction with Dr.  Ladona Ridgel. Patient is ~9 weeks postop.  She reports she is doing really well, she is not having any issues today.  She is very happy with her results.  She does have some questions about using scar cream.  Chaperone present on exam Bilateral NAC's are viable, bilateral breast incisions are intact. There is no erythema or cellulitic changes noted. No subcutaneous fluid collections noted with palpation. She does have some thickening of the scar around the superior NAC's incisions bilaterally.  There is no keloiding present.  These areas are nontender.  A/P:  We discussed the use of scar creams.  Recommend continuing with compressive garment 24/7 until 6 weeks post-op,  avoiding strenuous activity/heavy lifting until 6 weeks post-op  Recommend following up as needed. All the patient's questions were answered to their content. Recommend calling with any questions or concerns.

## 2023-01-25 ENCOUNTER — Other Ambulatory Visit: Payer: Self-pay | Admitting: *Deleted

## 2023-01-25 DIAGNOSIS — Z30011 Encounter for initial prescription of contraceptive pills: Secondary | ICD-10-CM

## 2023-01-25 MED ORDER — NORETHIN ACE-ETH ESTRAD-FE 1-20 MG-MCG(24) PO TABS
1.0000 | ORAL_TABLET | Freq: Every day | ORAL | 3 refills | Status: DC
Start: 2023-01-25 — End: 2023-03-08

## 2023-02-06 ENCOUNTER — Ambulatory Visit: Payer: Managed Care, Other (non HMO)

## 2023-02-09 ENCOUNTER — Ambulatory Visit: Payer: Medicaid Other

## 2023-03-08 ENCOUNTER — Encounter: Payer: Self-pay | Admitting: Obstetrics & Gynecology

## 2023-03-08 ENCOUNTER — Telehealth (INDEPENDENT_AMBULATORY_CARE_PROVIDER_SITE_OTHER): Payer: Managed Care, Other (non HMO) | Admitting: Obstetrics & Gynecology

## 2023-03-08 DIAGNOSIS — N760 Acute vaginitis: Secondary | ICD-10-CM

## 2023-03-08 DIAGNOSIS — Z30011 Encounter for initial prescription of contraceptive pills: Secondary | ICD-10-CM

## 2023-03-08 MED ORDER — NORETHIN ACE-ETH ESTRAD-FE 1-20 MG-MCG(24) PO TABS: 1.0000 | ORAL_TABLET | Freq: Every day | ORAL | 3 refills | Status: AC

## 2023-03-08 NOTE — Progress Notes (Signed)
TELEHEALTH GYNECOLOGY VISIT ENCOUNTER NOTE  Provider location: [facility]   Patient location: Home  I connected with Kathleen Hamilton on 03/08/23 at  8:50 AM EST by telephone and verified that I am speaking with the correct person using two identifiers. Patient was unable to do MyChart audiovisual encounter due to technical difficulties, she tried several times.    I discussed the limitations, risks, security and privacy concerns of performing an evaluation and management service by telephone and the availability of in person appointments. I also discussed with the patient that there may be a patient responsible charge related to this service. The patient expressed understanding and agreed to proceed.   History:  Kathleen Hamilton is a 28 y.o. [phone] female being evaluated today for contraceptive management.  She had previously been on COC's, but due to recent surgery she was discontinued and then was not sure how to restart.  She is interested in restarting the pills.  Of note she is currently being treated for BV infection.  She reports that she will be okay for a few months and then her symptoms will return.  Mostly she will note vaginal burning and discomfort with urination.  She is interested in knowing how to prevent this.  Currently her symptoms are resolving  Following her.  She will also note significant vulvar dryness and skin irritation.  She has been using the same pads for many years and did not used to have this problem.    Past Medical History:  Diagnosis Date   Anxiety    Dysmenorrhea    Endometriosis    Heart murmur    (noted as a child, nothing as adult)   Pregnancy induced hypertension    Ulcerative colitis (HCC)    "in remission" per pt   Past Surgical History:  Procedure Laterality Date   BIOPSY BOWEL     BREAST REDUCTION SURGERY Bilateral 11/11/2022   Procedure: MAMMARY REDUCTION  (BREAST);  Surgeon: [name], MD;   Location: [facility];  Service: Plastics;  Laterality: Bilateral;  3 hours   CESAREAN SECTION MULTI-GESTATIONAL N/A 10/28/2021   Procedure: CESAREAN SECTION MULTI-GESTATIONAL;  Surgeon: Conan Bowens, MD;  Location: [facility];  Service: Obstetrics;  Laterality: N/A;   COLON BIOPSY     hx of colon problems.  ? mega colon, awaiting bx results   NO PAST SURGERIES     The following portions of the patient's history were reviewed and updated as appropriate: allergies, current medications, past family history, past medical history, past social history, past surgical history and problem list.   Health Maintenance:  Normal pap and negative HRHPV on 11/2021.  Review of Systems:  Pertinent items noted in HPI and remainder of comprehensive ROS otherwise negative.  Physical Exam:   General:  Alert, oriented and cooperative.   Mental Status: Normal mood and affect perceived. Normal judgment and thought content.  Physical exam deferred due to nature of the encounter  Assessment and Plan:     -Contraceptive management Reviewed how to restart OCPs immediately following her menses  -Recurrent vaginitis Reviewed probiotics, also discussed trial of OTC boric acid suppository Patient may return for nurse visit if needed for further testing Should she have another infection, will send in prescription with refills  Follow-up as scheduled in October for annual      I discussed the assessment and treatment plan with the patient. The patient was provided an opportunity to  ask questions and all were answered. The patient agreed with the plan and demonstrated an understanding of the instructions.   I provided 15 minutes of non-face-to-face time during this encounter, which including reviewing the chart, talking to patient and documentation.   Sharon Seller, DO [facility] [employer], [facility]

## 2023-03-13 ENCOUNTER — Encounter: Payer: Self-pay | Admitting: Obstetrics & Gynecology

## 2023-03-13 ENCOUNTER — Other Ambulatory Visit: Payer: Self-pay | Admitting: *Deleted

## 2023-03-13 DIAGNOSIS — Z113 Encounter for screening for infections with a predominantly sexual mode of transmission: Secondary | ICD-10-CM

## 2023-03-16 ENCOUNTER — Encounter: Payer: Self-pay | Admitting: Obstetrics & Gynecology

## 2023-03-16 ENCOUNTER — Other Ambulatory Visit: Payer: Self-pay | Admitting: Obstetrics & Gynecology

## 2023-03-16 DIAGNOSIS — Z113 Encounter for screening for infections with a predominantly sexual mode of transmission: Secondary | ICD-10-CM

## 2023-03-16 LAB — HIV ANTIBODY (ROUTINE TESTING W REFLEX): HIV Screen 4th Generation wRfx: NONREACTIVE

## 2023-03-16 LAB — RPR

## 2023-03-18 LAB — RPR: RPR Ser Ql: NONREACTIVE

## 2023-03-20 ENCOUNTER — Encounter: Payer: Self-pay | Admitting: Obstetrics & Gynecology

## 2023-04-20 ENCOUNTER — Other Ambulatory Visit (HOSPITAL_COMMUNITY)
Admission: RE | Admit: 2023-04-20 | Discharge: 2023-04-20 | Disposition: A | Discharge status: Home / Self Care | Payer: Self-pay | Source: Ambulatory Visit | Attending: Obstetrics & Gynecology | Admitting: Obstetrics & Gynecology

## 2023-04-20 ENCOUNTER — Ambulatory Visit (INDEPENDENT_AMBULATORY_CARE_PROVIDER_SITE_OTHER): Payer: Self-pay | Admitting: Obstetrics & Gynecology

## 2023-04-20 ENCOUNTER — Encounter: Payer: Self-pay | Admitting: Obstetrics & Gynecology

## 2023-04-20 VITALS — BP 135/83 | HR 83 | Ht 65.75 in | Wt 233.6 lb

## 2023-04-20 DIAGNOSIS — N898 Other specified noninflammatory disorders of vagina: Secondary | ICD-10-CM | POA: Insufficient documentation

## 2023-04-20 NOTE — Progress Notes (Signed)
   GYN VISIT Patient name: Kathleen Hamilton MRN [id number]  Date of birth: 05/01/1995 Chief Complaint:   Follow-up (Vaginal dryness before her period)  History of Present Illness:   Kathleen Hamilton is a 28 y.o. [phone]  female being seen today for the following concerns:  -Vaginal irritation:Notes irritation right before and after her period.  Feels like there is "chafing" of her labia.  Feels dry and itching and on occasion swollen and then goes away after her period.  She has not tried anything over the counter.  Using pads, but has recently changed to a different type and changing a pad every 2 hours.  Denies changes to body wash or other acute changes in routine.  Not sexually active- no longer on COCs.(Feels like maybe she had a rash on her abdomen that may have been due to pills- since resolved)  Menses are regular each month-denies issues with her menses.     Patient's last menstrual period was 04/17/2023.    Review of Systems:   Pertinent items are noted in HPI Denies fever/chills, dizziness, headaches, visual disturbances, fatigue, shortness of breath, chest pain, abdominal pain, vomiting, no problems with periods, bowel movements, urination, or intercourse unless otherwise stated above.  Pertinent History Reviewed:   Past Surgical History:  Procedure Laterality Date   BIOPSY BOWEL     BREAST REDUCTION SURGERY Bilateral 11/11/2022   Procedure: MAMMARY REDUCTION  (BREAST);  Surgeon: [name], MD;  Location: [facility];  Service: Plastics;  Laterality: Bilateral;  3 hours   CESAREAN SECTION MULTI-GESTATIONAL N/A 10/28/2021   Procedure: CESAREAN SECTION MULTI-GESTATIONAL;  Surgeon: Conan Bowens, MD;  Location: [facility];  Service: Obstetrics;  Laterality: N/A;   COLON BIOPSY     hx of colon problems.  ? mega colon, awaiting bx results   NO PAST SURGERIES      Past Medical History:  Diagnosis Date   Anxiety    Dysmenorrhea    Endometriosis     Heart murmur    (noted as a child, nothing as adult)   Pregnancy induced hypertension    Ulcerative colitis (HCC)    "in remission" per pt   Reviewed problem list, medications and allergies. Physical Assessment:   Vitals:   04/20/23 1147  BP: 135/83  Pulse: 83  Weight: 233 lb 9.6 oz (106 kg)  Height: 5' 5.75" (1.67 m)  Body mass index is 37.99 kg/m.       Physical Examination:   General appearance: alert, well appearing, and in no distress  Psych: mood appropriate, normal affect  Skin: warm & dry   Cardiovascular: normal heart rate noted  Respiratory: normal respiratory effort, no distress  Abdomen: soft, non-tender   Pelvic: VULVA: normal appearing vulva with no masses, tenderness or lesions.  Vaginal swab obtained  Extremities: no edema   Chaperone: Faith Rogue    Assessment & Plan:  1) Cyclical-vagnitis -plan to r/o underlying infection -discussed pro-biotics to help boost "good bacteria" -reassured pt that no abnormalities were seen on exam -f/u prn or for annual   No orders of the defined types were placed in this encounter.   Return if symptoms worsen or fail to improve.   Myna Hidalgo, DO Attending Obstetrician & Gynecologist, [facility] for [employer], [facility]

## 2023-04-24 ENCOUNTER — Encounter: Payer: Self-pay | Admitting: Obstetrics & Gynecology

## 2023-04-25 LAB — CERVICOVAGINAL ANCILLARY ONLY
Bacterial Vaginitis (gardnerella): POSITIVE — AB
Candida Glabrata: NEGATIVE
Candida Vaginitis: NEGATIVE
Comment: NEGATIVE
Comment: NEGATIVE
Comment: NEGATIVE

## 2023-04-30 ENCOUNTER — Encounter: Payer: Self-pay | Admitting: Obstetrics & Gynecology

## 2023-04-30 ENCOUNTER — Other Ambulatory Visit: Payer: Self-pay | Admitting: Obstetrics & Gynecology

## 2023-04-30 MED ORDER — METRONIDAZOLE 500 MG PO TABS: 500.0000 mg | ORAL_TABLET | Freq: Two times a day (BID) | ORAL | 2 refills | Status: AC

## 2023-04-30 NOTE — Progress Notes (Signed)
 Rx for BV  Myna Hidalgo, DO Attending Obstetrician & Gynecologist, [facility] for [employer], [facility]

## 2023-09-01 ENCOUNTER — Encounter: Payer: Self-pay | Admitting: Advanced Practice Midwife

## 2023-09-19 ENCOUNTER — Encounter (HOSPITAL_COMMUNITY): Payer: Self-pay

## 2023-09-19 ENCOUNTER — Ambulatory Visit (HOSPITAL_COMMUNITY)
Admission: EM | Admit: 2023-09-19 | Discharge: 2023-09-19 | Disposition: A | Discharge status: Home / Self Care | Payer: Self-pay | Attending: Emergency Medicine | Admitting: Emergency Medicine

## 2023-09-19 DIAGNOSIS — K279 Peptic ulcer, site unspecified, unspecified as acute or chronic, without hemorrhage or perforation: Secondary | ICD-10-CM

## 2023-09-19 MED ORDER — PANTOPRAZOLE SODIUM 40 MG PO TBEC: 40.0000 mg | DELAYED_RELEASE_TABLET | Freq: Every day | ORAL | 0 refills | Status: AC

## 2023-09-19 MED ORDER — ALUM & MAG HYDROXIDE-SIMETH 200-200-20 MG/5ML PO SUSP
30.0000 mL | Freq: Once | ORAL | Status: AC
  Administered 2023-09-19: 30 mL via ORAL

## 2023-09-19 MED ORDER — ALUM & MAG HYDROXIDE-SIMETH 200-200-20 MG/5ML PO SUSP
ORAL | Status: AC
  Filled 2023-09-19: qty 30

## 2023-09-19 MED ORDER — ONDANSETRON 4 MG PO TBDP: 4.0000 mg | ORAL_TABLET | Freq: Four times a day (QID) | ORAL | 0 refills | Status: AC | PRN

## 2023-09-19 MED ORDER — LIDOCAINE VISCOUS HCL 2 % MT SOLN
15.0000 mL | Freq: Once | OROMUCOSAL | Status: AC
  Administered 2023-09-19: 15 mL via OROMUCOSAL

## 2023-09-19 MED ORDER — LIDOCAINE VISCOUS HCL 2 % MT SOLN
OROMUCOSAL | Status: AC
  Filled 2023-09-19: qty 15

## 2023-09-19 NOTE — ED Triage Notes (Signed)
 Pt c/o upper abdominal pain since Saturday and worse last night. States pain worse when eating and has watery stools. States decrease appetite. States abdomen is distended and feels like its on fire. States taken tylenol  and tums with no relief.

## 2023-09-19 NOTE — ED Provider Notes (Signed)
 [facility]    CSN: [id number] Arrival date & time: 09/19/23  0807      History   Chief Complaint Chief Complaint  Patient presents with   Abdominal Pain    HPI Kathleen Hamilton is a 28 y.o. female.  Epigastric burning pain for 3-4 days. Increasing symptoms last night. Currently rating 8/10 pain Pain worse 30 min after eating, and over night Having some diarrhea, feels bloated Nausea but no vomiting. Tolerating fluids. No urinary symptoms  Has tried tums, tylenol    Does have history of these symptoms treated with PPI and H2 blocker   History of rectal bleeding, ulcerative colitis. Used to see peds GI  LMP is now   Past Medical History:  Diagnosis Date   Anxiety    Dysmenorrhea    Endometriosis    Heart murmur    (noted as a child, nothing as adult)   Pregnancy induced hypertension    Ulcerative colitis (HCC)    in remission per pt    Patient Active Problem List   Diagnosis Date Noted   Status post primary low transverse cesarean section 10/28/2021   Anxiety 05/06/2021   Supervision of high-risk pregnancy 05/06/2021   History of gestational hypertension 05/06/2021   Dichorionic diamniotic twin gestation 05/06/2021   History of prior pregnancy with IUGR newborn 05/06/2021   Sickle cell trait (HCC) 05/06/2021   VISUAL ACUITY, DECREASED 04/19/2010   Overweight 04/15/2010   DYSMENORRHEA 04/15/2010   MENORRHAGIA 04/15/2010    Past Surgical History:  Procedure Laterality Date   BIOPSY BOWEL     BREAST REDUCTION SURGERY Bilateral 11/11/2022   Procedure: MAMMARY REDUCTION  (BREAST);  Surgeon: Waddell Leonce NOVAK, MD;  Location: [facility];  Service: Plastics;  Laterality: Bilateral;  3 hours   CESAREAN SECTION MULTI-GESTATIONAL N/A 10/28/2021   Procedure: CESAREAN SECTION MULTI-GESTATIONAL;  Surgeon: Nicholaus Burnard HERO, MD;  Location: [facility];  Service: Obstetrics;  Laterality: N/A;   COLON BIOPSY     hx of colon problems.  ? mega  colon, awaiting bx results   NO PAST SURGERIES      OB History     Gravida  3   Para  2   Term  2   Preterm      AB  1   Living  3      SAB  1   IAB      Ectopic      Multiple  1   Live Births  3            Home Medications    Prior to Admission medications   Medication Sig Start Date End Date Taking? Authorizing Provider  ondansetron  (ZOFRAN -ODT) 4 MG disintegrating tablet Take 1 tablet (4 mg total) by mouth every 6 (six) hours as needed for nausea or vomiting. 09/19/23  Yes Dayanne Yiu, Asberry, PA-C  pantoprazole  (PROTONIX ) 40 MG tablet Take 1 tablet (40 mg total) by mouth daily for 14 days. 09/19/23 10/03/23 Yes Lile Mccurley, Asberry, PA-C  Norethindrone  Acetate-Ethinyl Estrad-FE (LOESTRIN 24 FE) 1-20 MG-MCG(24) tablet Take 1 tablet by mouth daily. May skip placebo week 03/08/23 03/07/24  Ozan, Jennifer, DO  hydrochlorothiazide  (HYDRODIURIL ) 25 MG tablet Take 1 tablet (25 mg total) by mouth daily. Patient not taking: Reported on 08/27/2018 07/13/18 08/27/18  Sigmon, Meredith C, CNM  iron  polysaccharides (NIFEREX) 150 MG capsule Take 1 capsule (150 mg total) by mouth daily. Patient not taking: Reported on 08/27/2018 07/14/18 08/27/18  Sigmon, Meredith C,  CNM    Family History Family History  Problem Relation Age of Onset   Healthy Mother    Healthy Father    GI problems Brother    Diabetes Maternal Grandmother    Hypertension Maternal Grandmother    Heart disease Maternal Grandmother    Breast cancer Maternal Grandmother        unsure age of onset   Diabetes Paternal Grandmother    Heart disease Paternal Grandmother    Hypertension Paternal Grandmother    Diabetes Paternal Grandfather    Heart disease Paternal Grandfather    Hypertension Paternal Grandfather     Social History Social History   Tobacco Use   Smoking status: Never    Passive exposure: Never   Smokeless tobacco: Never  Vaping Use   Vaping status: Never Used  Substance Use Topics   Alcohol use:  Yes    Comment: occasional   Drug use: No     Allergies   Aspirin, Fish allergy , Naproxen, Nsaids, Benzoin, and Shellfish allergy    Review of Systems Review of Systems  Gastrointestinal:  Positive for abdominal pain.   As per HPI  Physical Exam Triage Vital Signs ED Triage Vitals  Encounter Vitals Group     BP 09/19/23 0856 125/83     Girls Systolic BP Percentile --      Girls Diastolic BP Percentile --      Boys Systolic BP Percentile --      Boys Diastolic BP Percentile --      Pulse Rate 09/19/23 0856 85     Resp 09/19/23 0856 18     Temp 09/19/23 0856 97.9 F (36.6 C)     Temp Source 09/19/23 0856 Oral     SpO2 09/19/23 0856 98 %     Weight --      Height --      Head Circumference --      Peak Flow --      Pain Score 09/19/23 0855 8     Pain Loc --      Pain Education --      Exclude from Growth Chart --    No data found.  Updated Vital Signs BP 125/83 (BP Location: Left Arm)   Pulse 85   Temp 97.9 F (36.6 C) (Oral)   Resp 18   LMP 09/18/2023 (Exact Date)   SpO2 98%    Physical Exam Vitals and nursing note reviewed.  Constitutional:      Appearance: Normal appearance.  HENT:     Mouth/Throat:     Mouth: Mucous membranes are moist.     Pharynx: Oropharynx is clear.  Eyes:     Conjunctiva/sclera: Conjunctivae normal.  Cardiovascular:     Rate and Rhythm: Normal rate and regular rhythm.     Heart sounds: Normal heart sounds.  Pulmonary:     Effort: Pulmonary effort is normal. No respiratory distress.     Breath sounds: Normal breath sounds.  Abdominal:     General: Bowel sounds are normal.     Palpations: Abdomen is soft.     Tenderness: There is abdominal tenderness in the epigastric area. There is no right CVA tenderness, left CVA tenderness, guarding or rebound.  Musculoskeletal:        General: Normal range of motion.  Skin:    General: Skin is warm and dry.  Neurological:     Mental Status: She is alert and oriented to person,  place, and time.  UC Treatments / Results  Labs (all labs ordered are listed, but only abnormal results are displayed) Labs Reviewed - No data to display  EKG   Radiology No results found.  Procedures Procedures (including critical care time)  Medications Ordered in UC Medications  alum & mag hydroxide-simeth (MAALOX/MYLANTA) 200-200-20 MG/5ML suspension 30 mL (30 mLs Oral Given 09/19/23 0927)  lidocaine  (XYLOCAINE ) 2 % viscous mouth solution 15 mL (15 mLs Mouth/Throat Given 09/19/23 0927)    Initial Impression / Assessment and Plan / UC Course  I have reviewed the triage vital signs and the nursing notes.  Pertinent labs & imaging results that were available during my care of the patient were reviewed by me and considered in my medical decision making (see chart for details).  Afebrile, stable vitals, well-appearing. Likely peptic ulcer. GI cocktail given in clinic with improvement  Protonix  daily for 14 days.  Pepcid  if needed, zofran  q6 hours prn She has a current gastroenterologist at [facility] but reports she is looking to switch.  Referral to [facility] is placed. Discussed return and ED precautions  Final Clinical Impressions(s) / UC Diagnoses   Final diagnoses:  Peptic ulcer     Discharge Instructions      Protonix  (pantoprazole ) once daily for the next 14 days. It can take 1-3 days to start working. Take first thing in the morning on an empty stomach.  Remain upright for 30 minutes after taking medicine.  Please take the full 14-day course.  The zofran  can be used every 6 hours as needed to settle the stomach  You can use famotidine /Pepcid  at home as needed.  Increase fluids as much as tolerated.  Bland diet recommended for the next 2 weeks.  Avoid fatty, greasy, citrusy, spicy, caffeine  and alcohol as these can worsen symptoms.   I have placed a referral to [facility] gastroenterology.  They will call you to make an appointment  If at any point your  symptoms worsen, including severe pain or inability to tolerate fluids, please go directly to the emergency department.     ED Prescriptions     Medication Sig Dispense Auth. Provider   pantoprazole  (PROTONIX ) 40 MG tablet Take 1 tablet (40 mg total) by mouth daily for 14 days. 14 tablet Shervon Kerwin, PA-C   ondansetron  (ZOFRAN -ODT) 4 MG disintegrating tablet Take 1 tablet (4 mg total) by mouth every 6 (six) hours as needed for nausea or vomiting. 20 tablet Kenrick Pore, Asberry, PA-C      PDMP not reviewed this encounter.   Jeryl Asberry, [place] 09/19/23 1014

## 2023-09-19 NOTE — Discharge Instructions (Addendum)
 Protonix  (pantoprazole ) once daily for the next 14 days. It can take 1-3 days to start working. Take first thing in the morning on an empty stomach.  Remain upright for 30 minutes after taking medicine.  Please take the full 14-day course.  The zofran  can be used every 6 hours as needed to settle the stomach  You can use famotidine /Pepcid  at home as needed.  Increase fluids as much as tolerated.  Bland diet recommended for the next 2 weeks.  Avoid fatty, greasy, citrusy, spicy, caffeine  and alcohol as these can worsen symptoms.   I have placed a referral to [facility] gastroenterology.  They will call you to make an appointment  If at any point your symptoms worsen, including severe pain or inability to tolerate fluids, please go directly to the emergency department.

## 2023-11-09 ENCOUNTER — Ambulatory Visit: Payer: Self-pay | Admitting: Gastroenterology

## 2023-11-09 NOTE — Progress Notes (Deleted)
 Chief Complaint:History of reflux, ulcerative colitis, EGD Primary GI Doctor:***  HPI:  Kathleen Hamilton is a  28  year old female Kathleen Hamilton with past medical history of GERD, ulcerative colitis, *****who was referred to me by Rising, Asberry, PA-C on 09/19/23 for a evaluation of History of reflux, ulcerative colitis, EGD .    09/19/23 Kathleen Hamilton seen in ED for epigastric abdominal pain. GI cocktail given in clinic with improvement. Protonix  daily for 14 days.  Pepcid  if needed, zofran  q6 hours prn. She has a current gastroenterologist at [facility] but reports she is looking to switch.  Referral to [facility] is placed.   Interval History  Kathleen Hamilton admits/denies GERD Kathleen Hamilton started on Protonix  for 14 days and Pepcid  Kathleen Hamilton admits/denies dysphagia Kathleen Hamilton admits/denies nausea, vomiting, or weight loss  Kathleen Hamilton admits/denies altered bowel habits Kathleen Hamilton admits/denies abdominal pain Kathleen Hamilton admits/denies rectal bleeding   Denies/Admits alcohol Denies/Admits smoking Denies/Admits NSAID use. Denies/Admits they are on blood thinners.  Patients last colonoscopy Patients last EGD  Surgical history:  Kathleen Hamilton's family history includes  Wt Readings from Last 3 Encounters:  04/20/23 233 lb 9.6 oz (106 kg)  11/11/22 238 lb 1.6 oz (108 kg)  10/27/22 241 lb 12.8 oz (109.7 kg)      Past Medical History:  Diagnosis Date   Anxiety    Dysmenorrhea    Endometriosis    Heart murmur    (noted as a child, nothing as adult)   Pregnancy induced hypertension    Ulcerative colitis (HCC)    in remission per pt    Past Surgical History:  Procedure Laterality Date   BIOPSY BOWEL     BREAST REDUCTION SURGERY Bilateral 11/11/2022   Procedure: MAMMARY REDUCTION  (BREAST);  Surgeon: Waddell Leonce NOVAK, MD;  Location: [facility];  Service: Plastics;  Laterality: Bilateral;  3 hours   CESAREAN SECTION MULTI-GESTATIONAL N/A 10/28/2021   Procedure: CESAREAN SECTION MULTI-GESTATIONAL;  Surgeon: Nicholaus Burnard HERO, MD;  Location: [facility];  Service: Obstetrics;  Laterality: N/A;   COLON BIOPSY     hx of colon problems.  ? mega colon, awaiting bx results   NO PAST SURGERIES      Current Outpatient Medications  Medication Sig Dispense Refill   Norethindrone  Acetate-Ethinyl Estrad-FE (LOESTRIN 24 FE) 1-20 MG-MCG(24) tablet Take 1 tablet by mouth daily. Shaw Dobek skip placebo week 90 tablet 3   ondansetron  (ZOFRAN -ODT) 4 MG disintegrating tablet Take 1 tablet (4 mg total) by mouth every 6 (six) hours as needed for nausea or vomiting. 20 tablet 0   pantoprazole  (PROTONIX ) 40 MG tablet Take 1 tablet (40 mg total) by mouth daily for 14 days. 14 tablet 0   No current facility-administered medications for this visit.    Allergies as of 11/09/2023 - Review Complete 09/19/2023  Allergen Reaction Noted   Aspirin Other (See Comments) 05/08/2011   Fish allergy  Diarrhea, Nausea And Vomiting, Swelling, and Other (See Comments) 09/20/2015   Naproxen Other (See Comments) 01/17/2014   Nsaids Other (See Comments) 11/02/2016   Benzoin Rash 11/08/2021   Shellfish allergy  Diarrhea, Nausea And Vomiting, Swelling, and Rash 05/24/2011    Family History  Problem Relation Age of Onset   Healthy Mother    Healthy Father    GI problems Brother    Diabetes Maternal Grandmother    Hypertension Maternal Grandmother    Heart disease Maternal Grandmother    Breast cancer Maternal Grandmother        unsure age of onset   Diabetes  Paternal Grandmother    Heart disease Paternal Grandmother    Hypertension Paternal Grandmother    Diabetes Paternal Grandfather    Heart disease Paternal Grandfather    Hypertension Paternal Grandfather     Review of Systems:    Constitutional: No weight loss, fever, chills, weakness or fatigue HEENT: Eyes: No change in vision               Ears, Nose, Throat:  No change in hearing or congestion Skin: No rash or itching Cardiovascular: No chest pain, chest pressure or palpitations    Respiratory: No SOB or cough Gastrointestinal: See HPI and otherwise negative Genitourinary: No dysuria or change in urinary frequency Neurological: No headache, dizziness or syncope Musculoskeletal: No new muscle or joint pain Hematologic: No bleeding or bruising Psychiatric: No history of depression or anxiety    Physical Exam:  Vital signs: There were no vitals taken for this visit.  Constitutional:   Pleasant *** female/female appears to be in NAD, Well developed, Well nourished, alert and cooperative Eyes:   PEERL, EOMI. No icterus. Conjunctiva pink. Neck:  Supple Throat: Oral cavity and pharynx without inflammation, swelling or lesion.  Respiratory: Respirations even and unlabored. Lungs clear to auscultation bilaterally.   No wheezes, crackles, or rhonchi.  Cardiovascular: Normal S1, S2. Regular rate and rhythm. No peripheral edema, cyanosis or pallor.  Gastrointestinal:  Soft, nondistended, nontender. No rebound or guarding. Normal bowel sounds. No appreciable masses or hepatomegaly. Rectal:  Not performed.  Anoscopy: Msk:  Symmetrical without gross deformities. Without edema, no deformity or joint abnormality.  Neurologic:  Alert and  oriented x4;  grossly normal neurologically.  Skin:   Dry and intact without significant lesions or rashes.  RELEVANT LABS AND IMAGING: CBC    Latest Ref Rng & Units 09/26/2022    6:57 PM 11/01/2021    1:15 PM 10/29/2021    5:18 AM  CBC  WBC 4.0 - 10.5 K/uL 9.9  11.0  14.6   Hemoglobin 12.0 - 15.0 g/dL 87.7  89.8  9.1   Hematocrit 36.0 - 46.0 % 36.1  28.8  25.6   Platelets 150 - 400 K/uL 439  300  198      CMP     Latest Ref Rng & Units 09/26/2022    6:57 PM 11/01/2021    1:15 PM 10/13/2021   10:41 AM  CMP  Glucose 70 - 99 mg/dL 89  78  79   BUN 6 - 20 mg/dL 9  6  7    Creatinine 0.44 - 1.00 mg/dL 9.38  9.42  9.35   Sodium 135 - 145 mmol/L 138  139  140   Potassium 3.5 - 5.1 mmol/L 3.9  4.2  4.2   Chloride 98 - 111 mmol/L 107  109   109   CO2 22 - 32 mmol/L 23  23  16    Calcium 8.9 - 10.3 mg/dL 8.8  8.7  9.1   Total Protein 6.5 - 8.1 g/dL 7.2  6.3  6.6   Total Bilirubin 0.3 - 1.2 mg/dL 0.4  0.4  0.5   Alkaline Phos 38 - 126 U/L 38  93  145   AST 15 - 41 U/L 20  44  17   ALT 0 - 44 U/L 21  33  10      Lab Results  Component Value Date   TSH 2.376 10/27/2011   Echo 3/20- EF 60%  Imaging: 5/22 NM hepato EF Calculated gallbladder ejection fraction  is 60%. (Normal gallbladder ejection fraction with Ensure is greater than 33%.)  3/22 CTAP IMPRESSION: No acute findings in the abdomen or pelvis.   Assessment: 1. ***  Plan: -labs   Thank you for the courtesy of this consult. Please call me with any questions or concerns.   Yoselin Amerman, FNP-C [facility] 11/09/2023, 6:33 AM  Cc: Rolinda Millman, MD

## 2023-12-27 ENCOUNTER — Other Ambulatory Visit: Payer: Self-pay | Admitting: Family Medicine

## 2023-12-27 DIAGNOSIS — N649 Disorder of breast, unspecified: Secondary | ICD-10-CM

## 2024-01-01 ENCOUNTER — Other Ambulatory Visit: Payer: Self-pay

## 2024-01-01 DIAGNOSIS — N649 Disorder of breast, unspecified: Secondary | ICD-10-CM

## 2024-01-01 NOTE — Addendum Note (Signed)
 Addended by: ROGERIO TEMPIE SQUIBB on: 01/01/2024 10:49 AM   Modules accepted: Orders

## 2024-01-17 ENCOUNTER — Other Ambulatory Visit: Payer: Self-pay

## 2024-01-25 ENCOUNTER — Ambulatory Visit: Payer: Self-pay | Admitting: *Deleted

## 2024-01-25 ENCOUNTER — Ambulatory Visit
Admission: RE | Admit: 2024-01-25 | Discharge: 2024-01-25 | Disposition: A | Discharge status: Home / Self Care | Payer: Self-pay | Source: Ambulatory Visit | Attending: Obstetrics and Gynecology | Admitting: Obstetrics and Gynecology

## 2024-01-25 ENCOUNTER — Other Ambulatory Visit: Payer: Self-pay | Admitting: Obstetrics and Gynecology

## 2024-01-25 VITALS — Wt 245.6 lb

## 2024-01-25 DIAGNOSIS — N6310 Unspecified lump in the right breast, unspecified quadrant: Secondary | ICD-10-CM

## 2024-01-25 DIAGNOSIS — N649 Disorder of breast, unspecified: Secondary | ICD-10-CM

## 2024-01-25 DIAGNOSIS — Z803 Family history of malignant neoplasm of breast: Secondary | ICD-10-CM

## 2024-01-25 DIAGNOSIS — N6315 Unspecified lump in the right breast, overlapping quadrants: Secondary | ICD-10-CM

## 2024-01-25 DIAGNOSIS — Z1239 Encounter for other screening for malignant neoplasm of breast: Secondary | ICD-10-CM

## 2024-01-25 NOTE — Patient Instructions (Signed)
 Explained breast self awareness with [employer]. Patient did not need a Pap smear today due to last Pap smear was 12/08/2021. Let her know BCCCP will cover Pap smears every 3 years unless has a history of abnormal Pap smears. Referred patient to the [facility] for a right breast ultrasound. Appointment scheduled Thursday, January 25, 2024 at 1500. Patient aware of appointment and will be there. Markie N Langer verbalized understanding.  Nayib Remer, Wanda Ship, RN 12:57 PM

## 2024-01-25 NOTE — Progress Notes (Signed)
 [name] is a 28 y.o. female who presents to [facility] clinic today with complaint of right breast lump x 2 months.    Pap Smear: Pap smear not completed today. Last Pap smear was 12/08/2021 at [facility] clinic and was normal with negative HPV. Per patient has no history of an abnormal Pap smear. Last Pap smear result is available in Epic.   Physical exam: Breasts Breasts symmetrical. Scars bilateral lower breasts due to history of breast reduction surgery. No nipple retraction bilateral breasts. No nipple discharge bilateral breasts. No lymphadenopathy. No lumps palpated left breast. Palpated a pea sized lump within the right breast at 6 o'clock 4 cm from the nipple. No complaints of pain or tenderness on exam.       Pelvic/Bimanual Pap is not indicated today per BCCCP guidelines.   Smoking History: Patient has never smoked.   Patient Navigation: Patient education provided. Access to services provided for patient through [facility] program. Patient referred for Genetic Testing due family history.   Breast and Cervical Cancer Risk Assessment: Patient has family history of her mother and maternal grandmother having breast cancer. Patient has no known genetic mutations or history of radiation treatment to the chest before age 53. Patient does not have history of cervical dysplasia, immunocompromised, or DES exposure in-utero. Breast cancer risk assessment completed. No breast cancer risk calculated due to patient is less than 86 years old.  Risk Assessment   No risk assessment data     A: BCCCP exam without pap smear Complaint of right breast lump.  P: Referred patient to the [facility] for a right breast ultrasound. Appointment scheduled Thursday, January 25, 2024 at 1500.  Driscilla Wanda SQUIBB, RN 01/25/2024 12:57 PM

## 2024-01-27 ENCOUNTER — Ambulatory Visit: Payer: Self-pay | Admitting: Obstetrics & Gynecology

## 2024-01-28 NOTE — Progress Notes (Deleted)
 REFERRING PROVIDER: Rolinda Millman, MD [phone] [address] [place],  [place] [address]  PRIMARY PROVIDER:  Rolinda Millman, MD  PRIMARY REASON FOR VISIT:  No diagnosis found.  HISTORY OF PRESENT ILLNESS:   Kathleen [place], a 28 y.o. female, was seen for a [facility] cancer genetics consultation at the request of Millman Rolinda, MD due to a family history of breast cancer.  Kathleen Hamilton presents to clinic today to discuss the possibility of a hereditary predisposition to cancer, genetic testing, and to further clarify her future cancer risks, as well as potential cancer risks for family members.   CANCER HISTORY:  Kathleen [facility] is a 28 y.o. female with no personal history of cancer.    RISK FACTORS:  Menarche was at age ***.  First live birth at age ***.  OCP use for approximately {Numbers 1-12 multi-select:20307} years.  Oophorectomy: {Yes/No-Ex:120004}.  Hysterectomy: {Yes/No-Ex:120004}.  Menopausal status: {Menopause:31378}.  HRT use: {Numbers 1-12 multi-select:20307} years. Colonoscopy: {Yes/No-Ex:120004}; {normal/abnormal/not examined:14677}. Mammogram within the last year: {Yes/No-Ex:120004}. Number of breast biopsies: {Numbers 1-12 multi-select:20307}. Up to date with pelvic exams: {Yes/No-Ex:120004}. Any excessive radiation exposure or other environmental exposures the past: {Yes/No-Ex:120004} Tobacco Use: ***Current***Former***Never  Past Medical History:  Diagnosis Date   Anxiety    Dysmenorrhea    Endometriosis    Heart murmur    (noted as a child, nothing as adult)   Pregnancy induced hypertension    Ulcerative colitis (HCC)    in remission per pt   Past Surgical History:  Procedure Laterality Date   BIOPSY BOWEL     BREAST REDUCTION SURGERY Bilateral 11/11/2022   Procedure: MAMMARY REDUCTION  (BREAST);  Surgeon: Waddell Leonce NOVAK, MD;  Location: [facility];  Service: Plastics;  Laterality: Bilateral;  3 hours   CESAREAN SECTION  MULTI-GESTATIONAL N/A 10/28/2021   Procedure: CESAREAN SECTION MULTI-GESTATIONAL;  Surgeon: Nicholaus Burnard HERO, MD;  Location: [facility];  Service: Obstetrics;  Laterality: N/A;   COLON BIOPSY     hx of colon problems.  ? mega colon, awaiting bx results   NO PAST SURGERIES     Social History   Socioeconomic History   Marital status: Significant Other    Spouse name: Not on file   Number of children: Not on file   Years of education: Not on file   Highest education level: Not on file  Occupational History   Not on file  Tobacco Use   Smoking status: Never    Passive exposure: Never   Smokeless tobacco: Never  Vaping Use   Vaping status: Never Used  Substance and Sexual Activity   Alcohol use: Yes    Comment: occasional   Drug use: No   Sexual activity: Not Currently    Birth control/protection: None  Other Topics Concern   Not on file  Social History Narrative   Not on file   Social Drivers of Health   Tobacco Use: Low Risk (01/25/2024)   Patient History    Smoking Tobacco Use: Never    Smokeless Tobacco Use: Never    Passive Exposure: Never  Financial Resource Strain: Low Risk (12/08/2021)   Overall Financial Resource Strain (CARDIA)    Difficulty of Paying Living Expenses: Not hard at all  Food Insecurity: No Food Insecurity (01/25/2024)   Epic    Worried About Programme Researcher, Broadcasting/film/video in the Last Year: Never true    Ran Out of Food in the Last Year: Never true  Transportation Needs: No Transportation  Needs (01/25/2024)   Epic    Lack of Transportation (Medical): No    Lack of Transportation (Non-Medical): No  Physical Activity: Inactive (12/08/2021)   Exercise Vital Sign    Days of Exercise per Week: 0 days    Minutes of Exercise per Session: 0 min  Stress: No Stress Concern Present (12/08/2021)   [employer] of Occupational [employer] - Occupational Stress Questionnaire    Feeling of Stress : Only a little  Social Connections: Unknown (09/08/2022)   Received from  [facility]   Social Network    Social Network: Not on file  Depression [phone]): Low Risk (05/06/2021)   Depression (PHQ2-9)    PHQ-2 Score: 3  Alcohol Screen: Low Risk (12/08/2021)   Alcohol Screen    Last Alcohol Screening Score (AUDIT): 1  Housing: Low Risk (12/08/2021)   Housing    Last Housing Risk Score: 0  Utilities: Not At Risk (12/08/2021)   AHC Utilities    Threatened with loss of utilities: No  Health Literacy: Not on file    FAMILY HISTORY:  We obtained a detailed, 4-generation family history pasted below.   Remie Tworek is ***aware ***unaware of relatives completing genetic testing for hereditary cancer risks.  ***Patient's maternal ancestors are of *** descent, and paternal ancestors are of *** descent.  There {IS NO:12509} reported Ashkenazi Jewish ancestry.  There ***is no known consanguinity.  Pedigree Summary*** Significant diagnoses are listed below: Family History  Problem Relation Age of Onset   Healthy Mother    Healthy Father    GI problems Brother    Diabetes Maternal Grandmother    Hypertension Maternal Grandmother    Heart disease Maternal Grandmother    Breast cancer Maternal Grandmother        unsure age of onset   Diabetes Paternal Grandmother    Heart disease Paternal Grandmother    Hypertension Paternal Grandmother    Diabetes Paternal Grandfather    Heart disease Paternal Grandfather    Hypertension Paternal Grandfather     GENETIC COUNSELING ASSESSMENT: Kathleen [facility] is a 28 y.o. female with a {Personal/family:20331} history of {cancer/polyps} which is somewhat suggestive of a hereditary cancer predisposition syndrome*** given ***. We, therefore, discussed and recommended the following at today's visit.   DISCUSSION: We discussed that, in general, most cancer is not inherited in families, but instead is sporadic or familial. Sporadic cancers occur by chance and typically happen at older ages (>50 years) as this type of cancer is  caused by genetic changes acquired during an individuals lifetime. Some families have more cancers than would be expected by chance; however, the ages or types of cancer are not consistent with a known genetic mutation or known genetic mutations have been ruled out. This type of familial cancer is thought to be due to a combination of multiple genetic, environmental, hormonal, and lifestyle factors. While this combination of factors likely increases the risk of cancer, the exact source of this risk is not currently identifiable or testable.  We discussed that 5-10% of cancer is the result of germline (heritable) genetic variants, with most cases associated with ***BRCA1/BRCA2. There are other genes that can be associated with hereditary *** cancer syndromes. These include ***. We discussed that testing is beneficial for several reasons including knowing how to follow individuals after completing their treatment, identifying whether potential treatment options such as PARP inhibitors would be beneficial, and understanding if other family members could be at risk for cancer and allow them to undergo genetic  testing.   We reviewed the characteristics, features and inheritance patterns of hereditary cancer syndromes. We also discussed genetic testing, including the appropriate family members to test, the process of testing, insurance coverage and turn-around-time for results. We discussed the implications of a negative, positive, carrier and/or variant of uncertain significant result. Kathleen Hamilton  was offered a common hereditary cancer panel (***40 ***48 genes) and an expanded pan-cancer panel (***70***77 genes). Kathleen Hamilton was informed of the benefits and limitations of each panel, including that expanded pan-cancer panels contain genes that do not have clear management guidelines at this point in time.  We also discussed that as the number of genes included on a panel increases, the chances of variants of  uncertain significance increases.  ***GENETIC TESTING NATIONAL CRITERIA: Based on Kathleen Hamilton's {Personal/family:20331} history of cancer she meets medical criteria for genetic testing based on the [employer] (NCCN) guidelines. ***Though Kathleen Hamilton is not personally affected, there are no affected family members that are willing/able/available to undergo hereditary cancer testing. Therefore, Kathleen Hamilton the most informative family member available. ***Despite that she meets criteria, she may still have an out of pocket cost.  ***Despite that she meets criteria, she  may still have an out of pocket cost. she completed the [facility] patient assistance form in the office and qualified for $0 out of post cost. We discussed that she will get a text from Kathleenw. Grainger [facility] with billing formation and her out-of-pocket cost estimate.  ***We discussed that if her out of pocket cost for testing is over $100, the laboratory will call and confirm whether she wants to proceed with testing.  If the out of pocket cost of testing is less than $100 she will be billed by the genetic testing laboratory.   GENETIC TESTING CONSENT:  After considering the risks, benefits, and limitations, Kathleen Hamilton ***did NOT provide***provided informed consent to pursue genetic testing. A blood sample was sent to [facility] for analysis of the ***CancerNext-Expanded+RNA Panel. Results should be available within approximately ***2-3 weeks' time, at which point they will be disclosed by telephone to Kathleen Hamilton , as will any additional recommendations warranted by these results. Kathleen Hamilton will receive a summary of her genetic counseling visit and a copy of her results once available. This information will also be available in Epic.  ***{INSERT}.jrcambry ***{INSERT} .jrcinvit  ***DOES NOT MEET NCCN CRITERIA ***We discussed with Kathleen Hamilton that the {Personal/family:20331} history does not  meet insurance or NCCN criteria for genetic testing and, therefore, is not highly consistent with a familial hereditary cancer syndrome.  We feel she is at low risk to harbor  a gene mutation associated with such a condition. Thus, we did not recommend any genetic testing, at this time, and recommended Kathleen Hamilton continue to follow the cancer screening guidelines given by her primary healthcare provider.  ***STAT TESTING ***We reviewed the characteristics, features and inheritance patterns of hereditary cancer syndromes. We also discussed genetic testing, including the appropriate family members to test, the process of testing, insurance coverage and turn-around-time for results. We discussed the implications of a negative, positive and/or variant of uncertain significant result. In order to get genetic test results in a timely manner so that [facility] can use these genetic test results for surgical decisions, we recommended Kathleen Hamilton pursue genetic testing for the ***. Once complete, we recommend Kathleen Hamilton pursue reflex genetic testing to the *** gene panel.   GENETIC INFORMATION NONDISCRIMINATION ACT (GINA): We discussed that some people do  not want to undergo genetic testing due to fear of genetic discrimination.  The Genetic Information Nondiscrimination Act (GINA) was signed into federal law in 2008. GINA prohibits health insurers and most employers from discriminating against individuals based on genetic information (including the results of genetic tests and family history information). According to GINA, health insurance companies cannot consider genetic information to be a preexisting condition, nor can they use it to make decisions regarding coverage or rates. GINA also makes it illegal for most employers to use genetic information in making decisions about hiring, firing, promotion, or terms of employment. It is important to note that GINA does not offer protections for life  insurance, disability insurance, or long-term care insurance. GINA does not apply to those in the [employer], those who work for companies with less than 15 employees, and new life insurance or long-term disability insurance policies.  Health status due to a cancer diagnosis is not protected under GINA. More information about GINA can be found by visiting eliteclients.be. Lastly, we encouraged Kathleen Hamilton to remain in contact with cancer genetics annually so that we can continuously update the family history and inform her of any changes in cancer genetics and testing that may be of benefit for this family.   Chaquana Madole's questions were answered to her satisfaction today. Our contact information was provided should additional questions or concerns arise. Thank you for the referral and allowing us  to share in the care of your patient.   Resources:  Kathleen Hamilton was provided with the following:  ***[facility] [employer] information  ***[facility] Hereditary Cancer Testing Patient Guide ***[facility] Patient Assistance Information Sheet ***[facility] CancerNext-Expanded + RNAinsight gene list  PLAN:  ***Testing Ordered: ***Clinic Note Faxed/Routed to [facility]'s PCP ***Rolinda Millman, MD  ***[facility] Patient Assistance completed during visit (OOP ***$0) ***Declined Testing *** Despite our recommendation, Kathleen Hamilton did not wish to pursue genetic testing at today's visit. We understand this decision and remain available to coordinate genetic testing at any time in the future. We, therefore, recommend [facility] continue to follow the cancer screening guidelines given by her primary healthcare provider.  ***MOST INFORMATIVE PERSON TO TEST ***Based on Arkansas Pelzel's family history, we recommended her ***, who was diagnosed with *** at age ***, have genetic counseling and testing. Kayly Mallery will let us  know if we can be of any assistance in coordinating genetic  counseling and/or testing for this family member.   Santana Fryer, MS, CGC  Certified Genetic Counselor  Email: [email or web address]  Phone: [phone]  I personally spent a total of *** minutes in the care of the patient today including {Time Based Coding:210964241}.  *** The patient was seen alone.  ***The patient was joined by ***. Drs. Lanny Stalls, and/or Gudena were available for questions, if needed. _______________________________________________________________________ For Office Staff:  Number of people involved in session: *** Was an Intern/ student involved with case: {YES/NO:63}

## 2024-01-29 ENCOUNTER — Inpatient Hospital Stay: Payer: Self-pay

## 2024-01-29 ENCOUNTER — Telehealth: Payer: Self-pay | Admitting: Obstetrics and Gynecology

## 2024-01-29 NOTE — Telephone Encounter (Signed)
 Good Morning, courteous notification of patient rescheduling genetic referral from today to Feb 21 2024. Patient advised she is sick. Thank you

## 2024-02-06 ENCOUNTER — Other Ambulatory Visit

## 2024-02-06 DIAGNOSIS — N6310 Unspecified lump in the right breast, unspecified quadrant: Secondary | ICD-10-CM

## 2024-02-06 HISTORY — PX: BREAST BIOPSY: SHX20

## 2024-02-07 LAB — SURGICAL PATHOLOGY

## 2024-02-21 ENCOUNTER — Inpatient Hospital Stay: Payer: Self-pay | Attending: Genetic Counselor | Admitting: Licensed Clinical Social Worker

## 2024-02-21 ENCOUNTER — Inpatient Hospital Stay: Payer: Self-pay

## 2024-02-21 ENCOUNTER — Encounter: Payer: Self-pay | Admitting: Licensed Clinical Social Worker

## 2024-02-21 DIAGNOSIS — Z803 Family history of malignant neoplasm of breast: Secondary | ICD-10-CM

## 2024-02-21 NOTE — Progress Notes (Signed)
 REFERRING PROVIDER: Alger Gong, MD [address] [place],  [place] [address]  PRIMARY PROVIDER:  Rolinda Millman, MD  PRIMARY REASON FOR VISIT:  1. Family history of breast cancer    I connected with Kathleen [facility] on 02/21/2024 at 2:00 PM EDT by telephone and verified that I am speaking with the correct person using three identifiers.    Patient location: home Provider location: [facility]  HISTORY OF PRESENT ILLNESS:   Kathleen [facility], a 29 y.o. female, was seen for a [place] cancer genetics consultation at the request of Dr. Alger due to a family history of breast cancer.  Kathleen Hamilton presents to clinic today to discuss the possibility of a hereditary predisposition to cancer, genetic testing, and to further clarify her future cancer risks, as well as potential cancer risks for family members.   CANCER HISTORY:  Kathleen [facility] is a 29 y.o. female with no personal history of cancer.    RELEVANT MEDICAL HISTORY:  Menarche was at age 58.  First live birth at age 78.  Ovaries intact: yes.  Hysterectomy: no.  Menopausal status: premenopausal.  HRT use: 0 years. Colonoscopy: yes; for ulcerative colitis, no polyps. Mammogram within the last year: yes. Number of breast biopsies: 2.; 01/2024 biopsy showed PASH Up to date with pelvic exams: yes.  Past Medical History:  Diagnosis Date   Anxiety    Dysmenorrhea    Endometriosis    Heart murmur    (noted as a child, nothing as adult)   Pregnancy induced hypertension    Ulcerative colitis (HCC)    in remission per pt    Past Surgical History:  Procedure Laterality Date   BIOPSY BOWEL     BREAST BIOPSY Right 02/06/2024   US  RT BREAST BX W LOC DEV 1ST LESION IMG BX SPEC US  GUIDE 02/06/2024 [facility] MAMMOGRAPHY   BREAST REDUCTION SURGERY Bilateral 11/11/2022   Procedure: MAMMARY REDUCTION  (BREAST);  Surgeon: Waddell Leonce NOVAK, MD;  [facility];  Service: Plastics;  Laterality: Bilateral;  3 hours    CESAREAN SECTION MULTI-GESTATIONAL N/A 10/28/2021   Procedure: CESAREAN SECTION MULTI-GESTATIONAL;  Surgeon: Nicholaus Burnard HERO, MD;  Location: [facility];  Service: Obstetrics;  Laterality: N/A;   COLON BIOPSY     hx of colon problems.  ? mega colon, awaiting bx results   NO PAST SURGERIES      FAMILY HISTORY:  We obtained a detailed, 4-generation family history.  Significant diagnoses are listed below: Family History  Problem Relation Age of Onset   Breast cancer Mother 43   Healthy Father    GI problems Brother    Diabetes Maternal Grandmother    Hypertension Maternal Grandmother    Heart disease Maternal Grandmother    Breast cancer Maternal Grandmother        dx 59s   Cervical cancer Maternal Grandmother        dx 45s   Diabetes Paternal Grandmother    Heart disease Paternal Grandmother    Hypertension Paternal Grandmother    Diabetes Paternal Grandfather    Heart disease Paternal Grandfather    Hypertension Paternal Grandfather    Kathleen Hamilton's mother had breast cancer at 23 and is reportedly being monitored for possible blood cancer. Patient is unsure if she had genetic testing. Kathleen Hamilton maternal grandmother had breast and cervical cancer in her 51s and had skin cancer as well, she passed at 45. No other known cancers in the family.  Ms.  Hamilton is unaware of previous family history of genetic testing for hereditary cancer risks. There is no reported Ashkenazi Jewish ancestry. There is no known consanguinity.    GENETIC COUNSELING ASSESSMENT: Kathleen Hamilton is a 29 y.o. female with a family history of breast cancer which is somewhat suggestive of a hereditary cancer syndrome and predisposition to cancer. We, therefore, discussed and recommended the following at today's visit.   DISCUSSION: We discussed that approximately 10% of breast cancer is hereditary. Most cases of hereditary breast cancer are associated with BRCA1/BRCA2 genes, although there are other genes associated with  hereditary cancer as well. Cancers and risks are gene specific. We discussed that testing is beneficial for several reasons including knowing about cancer risks, identifying potential screening and risk-reduction options that may be appropriate, and to understand if other family members could be at risk for cancer and allow them to undergo genetic testing.   We reviewed the characteristics, features and inheritance patterns of hereditary cancer syndromes. We also discussed genetic testing, including the appropriate family members to test, the process of testing, insurance coverage and turn-around-time for results. We discussed the implications of a negative, positive and/or variant of uncertain significant result. We recommended Kathleen Hamilton pursue genetic testing for the [facility] CancerNext+RNA gene panel.   The [facility] CancerNext+RNAinsight Panel includes sequencing, rearrangement analysis, and RNA analysis for the following 40 genes: APC, ATM, BAP1, BARD1, BMPR1A, BRCA1, BRCA2, BRIP1, CDH1, CDKN2A, CHEK2, FH, FLCN, MET, MLH1, MSH2, MSH6, MUTYH, NF1, NTHL1, PALB2, PMS2, PTEN, RAD51C, RAD51D, RPS20, SMAD4, STK11, TP53, TSC1, TSC2, and VHL (sequencing and deletion/duplication); AXIN2, HOXB13, MBD4, MSH3, POLD1 and POLE (sequencing only); EPCAM and GREM1 (deletion/duplication only).  Based on Kathleen Hamilton's family history of cancer, she meets medical criteria for genetic testing. Though Kathleen Hamilton is not personally affected, there are no affected family members that are willing/able to undergo hereditary cancer testing.  Therefore, Kathleen Hamilton the most informative family member available. Despite that she meets criteria, she may still have an out of pocket cost.   PLAN: After considering the risks, benefits, and limitations, Kathleen Hamilton provided informed consent to pursue genetic testing. She will have blood drawn 1/16 and the blood sample will be sent to [facility] for analysis of the CancerNext+RNA Panel. Results  should be available within approximately 2-3 weeks' time, at which point they will be disclosed by telephone to Kathleen Hamilton, as will any additional recommendations warranted by these results. Kathleen Hamilton will receive a summary of her genetic counseling visit and a copy of her results once available. This information will also be available in Epic.   Kathleen Hamilton questions were answered to her satisfaction today. Our contact information was provided should additional questions or concerns arise. Thank you for the referral and allowing us  to share in the care of your patient.   Dena Cary, MS, [facility] Genetic Counselor [email or web address] Phone: [phone]  I personally spent a total of 45 minutes in the care of the patient today including getting/reviewing separately obtained history, counseling and educating, placing orders, and documenting clinical information in the EHR. Dr. Delinda was available for discussion regarding this case.   _______________________________________________________________________ For Office Staff:  Number of people involved in session: 1 Was an Intern/ student involved with case: no

## 2024-03-01 ENCOUNTER — Inpatient Hospital Stay: Payer: Self-pay
